# Patient Record
Sex: Female | Born: 1945
Health system: Southern US, Community
[De-identification: ages and names within clinical notes are randomized; demographics above are authoritative.]

## PROBLEM LIST (undated history)

## (undated) DIAGNOSIS — M542 Cervicalgia: Secondary | ICD-10-CM

## (undated) DIAGNOSIS — H6980 Other specified disorders of Eustachian tube, unspecified ear: Secondary | ICD-10-CM

## (undated) DIAGNOSIS — H6990 Unspecified Eustachian tube disorder, unspecified ear: Secondary | ICD-10-CM

## (undated) DIAGNOSIS — M810 Age-related osteoporosis without current pathological fracture: Secondary | ICD-10-CM

## (undated) DIAGNOSIS — I10 Essential (primary) hypertension: Secondary | ICD-10-CM

## (undated) DIAGNOSIS — M5137 Other intervertebral disc degeneration, lumbosacral region: Secondary | ICD-10-CM

## (undated) DIAGNOSIS — R413 Other amnesia: Secondary | ICD-10-CM

## (undated) DIAGNOSIS — M4802 Spinal stenosis, cervical region: Secondary | ICD-10-CM

## (undated) DIAGNOSIS — M51379 Other intervertebral disc degeneration, lumbosacral region without mention of lumbar back pain or lower extremity pain: Secondary | ICD-10-CM

## (undated) DIAGNOSIS — M5412 Radiculopathy, cervical region: Secondary | ICD-10-CM

## (undated) DIAGNOSIS — M5030 Other cervical disc degeneration, unspecified cervical region: Secondary | ICD-10-CM

## (undated) HISTORY — DX: Cervicalgia: M54.2

## (undated) HISTORY — PX: COLONOSCOPY: SHX174

## (undated) HISTORY — DX: Other intervertebral disc degeneration, lumbosacral region without mention of lumbar back pain or lower extremity pain: M51.379

## (undated) HISTORY — DX: Other specified disorders of Eustachian tube, unspecified ear: H69.80

## (undated) HISTORY — PX: TUBAL LIGATION: SHX77

## (undated) HISTORY — DX: Other intervertebral disc degeneration, lumbosacral region: M51.37

## (undated) HISTORY — DX: Other amnesia: R41.3

## (undated) HISTORY — PX: SPINE SURGERY: SHX786

## (undated) HISTORY — DX: Radiculopathy, cervical region: M54.12

## (undated) HISTORY — DX: Other cervical disc degeneration, unspecified cervical region: M50.30

## (undated) HISTORY — DX: Essential (primary) hypertension: I10

## (undated) HISTORY — DX: Unspecified eustachian tube disorder, unspecified ear: H69.90

## (undated) HISTORY — DX: Spinal stenosis, cervical region: M48.02

---

## 1997-12-16 ENCOUNTER — Other Ambulatory Visit: Admission: RE | Admit: 1997-12-16 | Discharge: 1997-12-16 | Payer: Self-pay | Admitting: Obstetrics and Gynecology

## 1998-12-08 ENCOUNTER — Other Ambulatory Visit: Admission: RE | Admit: 1998-12-08 | Discharge: 1998-12-08 | Payer: Self-pay | Admitting: Obstetrics and Gynecology

## 1999-01-05 ENCOUNTER — Emergency Department (HOSPITAL_COMMUNITY): Admission: EM | Admit: 1999-01-05 | Discharge: 1999-01-05 | Payer: Self-pay | Admitting: Emergency Medicine

## 2000-01-03 ENCOUNTER — Other Ambulatory Visit: Admission: RE | Admit: 2000-01-03 | Discharge: 2000-01-03 | Payer: Self-pay | Admitting: Obstetrics and Gynecology

## 2001-06-04 ENCOUNTER — Other Ambulatory Visit: Admission: RE | Admit: 2001-06-04 | Discharge: 2001-06-04 | Payer: Self-pay | Admitting: Obstetrics and Gynecology

## 2002-11-06 ENCOUNTER — Other Ambulatory Visit: Admission: RE | Admit: 2002-11-06 | Discharge: 2002-11-06 | Payer: Self-pay | Admitting: Obstetrics and Gynecology

## 2003-01-07 LAB — HM COLONOSCOPY

## 2003-11-11 ENCOUNTER — Other Ambulatory Visit: Admission: RE | Admit: 2003-11-11 | Discharge: 2003-11-11 | Payer: Self-pay | Admitting: Obstetrics and Gynecology

## 2003-11-27 ENCOUNTER — Ambulatory Visit: Payer: Self-pay | Admitting: Internal Medicine

## 2003-11-30 ENCOUNTER — Ambulatory Visit (HOSPITAL_COMMUNITY): Admission: RE | Admit: 2003-11-30 | Discharge: 2003-11-30 | Payer: Self-pay | Admitting: Internal Medicine

## 2004-09-15 ENCOUNTER — Ambulatory Visit: Payer: Self-pay | Admitting: Internal Medicine

## 2004-11-29 ENCOUNTER — Other Ambulatory Visit: Admission: RE | Admit: 2004-11-29 | Discharge: 2004-11-29 | Payer: Self-pay | Admitting: Obstetrics and Gynecology

## 2005-02-06 ENCOUNTER — Ambulatory Visit: Payer: Self-pay | Admitting: Internal Medicine

## 2005-02-07 ENCOUNTER — Ambulatory Visit: Payer: Self-pay | Admitting: Internal Medicine

## 2005-02-13 ENCOUNTER — Ambulatory Visit: Payer: Self-pay | Admitting: Internal Medicine

## 2006-04-11 ENCOUNTER — Ambulatory Visit: Payer: Self-pay | Admitting: Internal Medicine

## 2006-04-11 LAB — CONVERTED CEMR LAB
Basophils Absolute: 0.4 10*3/uL — ABNORMAL HIGH (ref 0.0–0.1)
Basophils Relative: 4.6 % — ABNORMAL HIGH (ref 0.0–1.0)
Eosinophils Absolute: 0.2 10*3/uL (ref 0.0–0.6)
Eosinophils Relative: 2.1 % (ref 0.0–5.0)
HCT: 43.4 % (ref 36.0–46.0)
Hemoglobin: 15 g/dL (ref 12.0–15.0)
Lymphocytes Relative: 39.3 % (ref 12.0–46.0)
MCHC: 34.6 g/dL (ref 30.0–36.0)
MCV: 89 fL (ref 78.0–100.0)
Monocytes Absolute: 0.4 10*3/uL (ref 0.2–0.7)
Monocytes Relative: 5.2 % (ref 3.0–11.0)
Neutro Abs: 3.9 10*3/uL (ref 1.4–7.7)
Neutrophils Relative %: 48.8 % (ref 43.0–77.0)
Platelets: 233 10*3/uL (ref 150–400)
RBC: 4.88 M/uL (ref 3.87–5.11)
RDW: 12.6 % (ref 11.5–14.6)
TSH: 1 microintl units/mL (ref 0.35–5.50)
Vitamin B-12: 299 pg/mL (ref 211–911)
WBC: 8 10*3/uL (ref 4.5–10.5)

## 2006-04-12 ENCOUNTER — Encounter: Admission: RE | Admit: 2006-04-12 | Discharge: 2006-04-12 | Payer: Self-pay | Admitting: Internal Medicine

## 2007-02-15 ENCOUNTER — Encounter: Payer: Self-pay | Admitting: Internal Medicine

## 2007-10-06 ENCOUNTER — Encounter: Payer: Self-pay | Admitting: Internal Medicine

## 2007-11-05 ENCOUNTER — Telehealth: Payer: Self-pay | Admitting: Internal Medicine

## 2008-01-08 LAB — HM MAMMOGRAPHY: HM Mammogram: NORMAL

## 2008-09-30 ENCOUNTER — Encounter: Payer: Self-pay | Admitting: Internal Medicine

## 2008-10-08 ENCOUNTER — Telehealth: Payer: Self-pay | Admitting: Internal Medicine

## 2008-10-09 ENCOUNTER — Telehealth: Payer: Self-pay | Admitting: Internal Medicine

## 2008-10-13 ENCOUNTER — Ambulatory Visit: Payer: Self-pay | Admitting: Internal Medicine

## 2008-10-13 DIAGNOSIS — H6980 Other specified disorders of Eustachian tube, unspecified ear: Secondary | ICD-10-CM | POA: Insufficient documentation

## 2008-10-13 DIAGNOSIS — I10 Essential (primary) hypertension: Secondary | ICD-10-CM | POA: Insufficient documentation

## 2008-10-13 DIAGNOSIS — M5137 Other intervertebral disc degeneration, lumbosacral region: Secondary | ICD-10-CM | POA: Insufficient documentation

## 2008-10-13 DIAGNOSIS — M5030 Other cervical disc degeneration, unspecified cervical region: Secondary | ICD-10-CM | POA: Insufficient documentation

## 2008-10-14 DIAGNOSIS — Z8679 Personal history of other diseases of the circulatory system: Secondary | ICD-10-CM | POA: Insufficient documentation

## 2008-10-14 DIAGNOSIS — R079 Chest pain, unspecified: Secondary | ICD-10-CM | POA: Insufficient documentation

## 2008-10-19 ENCOUNTER — Encounter: Payer: Self-pay | Admitting: Internal Medicine

## 2008-11-02 ENCOUNTER — Telehealth: Payer: Self-pay | Admitting: Internal Medicine

## 2008-11-11 ENCOUNTER — Encounter: Payer: Self-pay | Admitting: Internal Medicine

## 2009-01-21 ENCOUNTER — Encounter: Payer: Self-pay | Admitting: Internal Medicine

## 2009-01-23 HISTORY — PX: NASAL SEPTUM SURGERY: SHX37

## 2009-04-26 ENCOUNTER — Emergency Department (HOSPITAL_COMMUNITY): Admission: EM | Admit: 2009-04-26 | Discharge: 2009-04-27 | Payer: Self-pay | Admitting: Emergency Medicine

## 2009-05-06 ENCOUNTER — Ambulatory Visit: Payer: Self-pay | Admitting: Internal Medicine

## 2009-05-06 DIAGNOSIS — M5412 Radiculopathy, cervical region: Secondary | ICD-10-CM | POA: Insufficient documentation

## 2009-05-06 DIAGNOSIS — M542 Cervicalgia: Secondary | ICD-10-CM | POA: Insufficient documentation

## 2009-05-07 ENCOUNTER — Telehealth: Payer: Self-pay | Admitting: Internal Medicine

## 2009-05-10 ENCOUNTER — Telehealth (INDEPENDENT_AMBULATORY_CARE_PROVIDER_SITE_OTHER): Payer: Self-pay | Admitting: *Deleted

## 2009-05-18 ENCOUNTER — Ambulatory Visit (HOSPITAL_COMMUNITY): Admission: RE | Admit: 2009-05-18 | Discharge: 2009-05-18 | Payer: Self-pay | Admitting: Internal Medicine

## 2009-05-19 ENCOUNTER — Telehealth: Payer: Self-pay | Admitting: Internal Medicine

## 2009-05-19 DIAGNOSIS — M4802 Spinal stenosis, cervical region: Secondary | ICD-10-CM | POA: Insufficient documentation

## 2009-05-20 ENCOUNTER — Ambulatory Visit: Payer: Self-pay | Admitting: Internal Medicine

## 2009-05-21 ENCOUNTER — Encounter: Payer: Self-pay | Admitting: Internal Medicine

## 2009-06-17 ENCOUNTER — Encounter: Payer: Self-pay | Admitting: Internal Medicine

## 2009-07-23 HISTORY — PX: CERVICAL DISCECTOMY: SHX98

## 2009-08-09 ENCOUNTER — Ambulatory Visit (HOSPITAL_COMMUNITY): Admission: RE | Admit: 2009-08-09 | Discharge: 2009-08-10 | Payer: Self-pay | Admitting: Neurosurgery

## 2009-08-20 ENCOUNTER — Encounter: Payer: Self-pay | Admitting: Internal Medicine

## 2009-10-06 ENCOUNTER — Encounter: Admission: RE | Admit: 2009-10-06 | Discharge: 2009-10-06 | Payer: Self-pay | Admitting: Neurosurgery

## 2009-10-07 ENCOUNTER — Telehealth: Payer: Self-pay | Admitting: Internal Medicine

## 2009-12-22 ENCOUNTER — Ambulatory Visit: Payer: Self-pay | Admitting: Internal Medicine

## 2009-12-22 LAB — CONVERTED CEMR LAB
Cholesterol: 219 mg/dL — ABNORMAL HIGH (ref 0–200)
Direct LDL: 127.5 mg/dL
HDL: 67.8 mg/dL (ref >39.00)
Total CHOL/HDL Ratio: 3
Triglycerides: 92 mg/dL (ref 0.0–149.0)
VLDL: 18.4 mg/dL (ref 0.0–40.0)

## 2010-02-07 ENCOUNTER — Encounter: Payer: Self-pay | Admitting: Internal Medicine

## 2010-02-12 ENCOUNTER — Encounter: Payer: Self-pay | Admitting: Internal Medicine

## 2010-02-22 NOTE — Assessment & Plan Note (Signed)
Summary: pain in neck and left shoulder and arm-strained muscle-lb   Vital Signs:  Patient profile:   65 year old female Height:      65 inches Weight:      176 pounds BMI:     29.39 O2 Sat:      97 % on Room air Temp:     98.1 degrees F oral Pulse rate:   76 / minute Pulse rhythm:   regular Resp:     16 per minute BP sitting:   118 / 76  (left arm) Cuff size:   large  Vitals Entered By: Rock Nephew CMA (May 06, 2009 8:15 AM)  Nutrition Counseling: Patient's BMI is greater than 25 and therefore counseled on weight management options.  O2 Flow:  Room air CC: cervical pain that radiates down left arm x 4days, Neck pain Is Patient Diabetic? No Pain Assessment Patient in pain? yes     Location: neck   Primary Care Provider:  michael Norins  CC:  cervical pain that radiates down left arm x 4days and Neck pain.  History of Present Illness:  Neck Pain      This is a 65 year old woman who presents with Neck pain.  The problem began 2 weeks ago.  The intensity is described as moderate-severe.  The patient reports left neck pain and left shoulder pain, but denies right neck pain, midline neck pain, bilateral neck pain, and headache.  Associated symptoms include numbness, tingling/parasthesias, and impaired neck ROM.  The patient denies the following associated symptoms: weakness, impaired coordination, gait disturbance, fever, bladder dysfunction, bowel dysfunction, locking, and clicking.  The pain is described as burning and radiates to the left arm.  The pain is better with rest and NSAIDs.  History is significant for cervical spondylosis and prior neck pain episodes.  Evaluation to date has included emergency room visit.    Current Medications (verified): 1)  Furosemide 20 Mg Tabs (Furosemide) .Marland Kitchen.. 1 By Mouth Once Daily 2)  Hydrocodone-Acetaminophen 5-325 Mg Tabs (Hydrocodone-Acetaminophen) .Marland Kitchen.. 1-2 Every Six Hours As Needed  Allergies (verified): 1)  ! Sulfa  Past  History:  Past Medical History: Reviewed history from 10/13/2008 and no changes required. UCD MITRAL VALVE PROLAPSE, HX OF (ICD-V12.50) Hx of CHEST PAIN (ICD-786.50)  Past Surgical History: Reviewed history from 10/13/2008 and no changes required. Colon polypectomy Tubal ligation  Family History: Reviewed history from 10/13/2008 and no changes required. Father - CAD/MI Brother - CAD Maternal Aunt - breast cancer Neg - colon cancer, DM  Social History: Reviewed history from 10/13/2008 and no changes required. HSG Married '69 1 daughter '92; 1 grandchild Work - Patent attorney marriage in good health; spouse with health problems  Review of Systems  The patient denies anorexia, fever, chest pain, peripheral edema, prolonged cough, abdominal pain, suspicious skin lesions, enlarged lymph nodes, and angioedema.    Physical Exam  General:  alert, well-developed, well-nourished, well-hydrated, appropriate dress, normal appearance, healthy-appearing, cooperative to examination, and good hygiene.   Head:  normocephalic, atraumatic, no abnormalities observed, and no abnormalities palpated.   Eyes:  vision grossly intact, pupils equal, pupils round, and pupils reactive to light.   Mouth:  Oral mucosa and oropharynx without lesions or exudates.  Teeth in good repair. Neck:  supple, full ROM, no masses, no thyromegaly, no JVD, normal carotid upstroke, no carotid bruits, and no cervical lymphadenopathy.   Lungs:  normal respiratory effort, no intercostal retractions, no accessory muscle use, normal breath sounds, no  dullness, no fremitus, no crackles, and no wheezes.   Heart:  normal rate, regular rhythm, no murmur, no gallop, no rub, and no JVD.   Abdomen:  soft, non-tender, normal bowel sounds, no distention, no masses, no guarding, no rigidity, no rebound tenderness, no hepatomegaly, and no splenomegaly.   Msk:  normal ROM, no joint tenderness, no joint swelling, no joint warmth,  no redness over joints, no joint deformities, no joint instability, no crepitation, and no muscle atrophy.   Pulses:  R and L carotid,radial,femoral,dorsalis pedis and posterior tibial pulses are full and equal bilaterally Extremities:  No clubbing, cyanosis, edema, or deformity noted with normal full range of motion of all joints.   Neurologic:  alert & oriented X3, cranial nerves II-XII intact, strength normal in all extremities, sensation intact to light touch, sensation intact to pinprick, gait normal, finger-to-nose normal, heel-to-shin normal, toes down bilaterally on Babinski, Romberg negative, and LUE hyperreflexia.   Skin:  Intact without suspicious lesions or rashes Cervical Nodes:  no anterior cervical adenopathy and no posterior cervical adenopathy.   Axillary Nodes:  no R axillary adenopathy and no L axillary adenopathy.   Psych:  Cognition and judgment appear intact. Alert and cooperative with normal attention span and concentration. No apparent delusions, illusions, hallucinations   Impression & Recommendations:  Problem # 1:  CERVICAL RADICULOPATHY, LEFT (ICD-723.4) Assessment New  Orders: Admin of Therapeutic Inj  intramuscular or subcutaneous (16109) Ketorolac-Toradol 15mg  (U0454) Radiology Referral (Radiology) T-Cervical Spine Comp 4 Views [phone])  Problem # 2:  DEGENERATIVE DISC DISEASE, CERVICAL SPINE (ICD-722.4) Assessment: Deteriorated  Orders: Radiology Referral (Radiology) T-Cervical Spine Comp 4 Views [phone])  Problem # 3:  NECK PAIN, ACUTE (ICD-723.1) Assessment: New  The following medications were removed from the medication list:    Hydrocodone-acetaminophen 5-325 Mg Tabs (Hydrocodone-acetaminophen) .Marland Kitchen... 1-2 every six hours as needed Her updated medication list for this problem includes:    Naprelan 750 Mg Xr24h-tab (Naproxen sodium) ..... One by mouth once daily for neck pain    Amrix 15 Mg Xr24h-cap (Cyclobenzaprine hcl) ..... One by mouth once  daily as needed for neck pain    Tramadol Hcl 50 Mg Tabs (Tramadol hcl) .Marland Kitchen... 1-2 by mouth qid as needed for pain  Orders: Radiology Referral (Radiology)  Complete Medication List: 1)  Furosemide 20 Mg Tabs (Furosemide) .Marland Kitchen.. 1 by mouth once daily 2)  Naprelan 750 Mg Xr24h-tab (Naproxen sodium) .... One by mouth once daily for neck pain 3)  Amrix 15 Mg Xr24h-cap (Cyclobenzaprine hcl) .... One by mouth once daily as needed for neck pain 4)  Tramadol Hcl 50 Mg Tabs (Tramadol hcl) .Marland Kitchen.. 1-2 by mouth qid as needed for pain  Patient Instructions: 1)  Please schedule a follow-up appointment in 2 weeks. 2)  Most patients (90%) with low back pain will improve with time (2-6 weeks). Keep active but avoid activities that are painful. Apply moist heat and/or ice to lower back several times a day. Prescriptions: TRAMADOL HCL 50 MG TABS (TRAMADOL HCL) 1-2 by mouth QID as needed for pain  #100 x 11   Entered and Authorized by:   Etta Grandchild MD   Signed by:   Etta Grandchild MD on 05/06/2009   Method used:   Print then Give to Patient   RxID:   [id number] AMRIX 15 MG XR24H-CAP (CYCLOBENZAPRINE HCL) One by mouth once daily as needed for neck pain  #15 x 0   Entered and Authorized by:   Etta Grandchild  MD   Signed by:   Etta Grandchild MD on 05/06/2009   Method used:   Samples Given   RxID:   [id number] NAPRELAN 750 MG XR24H-TAB (NAPROXEN SODIUM) One by mouth once daily for neck pain  #10 x 0   Entered and Authorized by:   Etta Grandchild MD   Signed by:   Etta Grandchild MD on 05/06/2009   Method used:   Samples Given   RxID:   [id number]    Medication Administration  Injection # 1:    Medication: Ketorolac-Toradol 15mg     Diagnosis: CERVICAL RADICULOPATHY, LEFT (ICD-723.4)    Route: IM    Site: LUOQ gluteus    Exp Date: 05/24/2010    Lot #: [id number]    Mfr: [email or web address]    Patient tolerated injection without complications    Given by: Rock Nephew CMA (May 06, 2009  8:38 AM)  Orders Added: 1)  Admin of Therapeutic Inj  intramuscular or subcutaneous [96372] 2)  Ketorolac-Toradol 15mg  [J1885] 3)  Radiology Referral [Radiology] 4)  T-Cervical Spine Comp 4 Views [72050TC] 5)  Est. Patient Level IV [32440]

## 2010-02-22 NOTE — Assessment & Plan Note (Signed)
Summary: FU---RENEW MEDS---STC   Vital Signs:  Patient profile:   65 year old female Height:      65 inches Weight:      171 pounds BMI:     28.56 O2 Sat:      98 % on Room air Temp:     97.5 degrees F oral Pulse rate:   76 / minute BP sitting:   142 / 74  (left arm) Cuff size:   large  Vitals Entered By: Bill Salinas CMA (December 22, 2009 10:34 AM)  O2 Flow:  Room air CC: pt here to discuss BP. She also wants to discuss nail fungus  Vision Screening:      Vision Comments: Last eye exam Oct 2011 with slight change in vision   Primary Care Provider:  Illene Regulus  CC:  pt here to discuss BP. She also wants to discuss nail fungus.  History of Present Illness: Patient presents for annual follow-up. In the interval she has had cervical disk surgery by Dr. Jordan Likes, C3-4 diskectomy and fusion in July after failing to improve on physical therapy. since siurgery she has done well without any further pain. She has otherwise done well.   She had mammogram Dec '10 and is scheduled for Dec '11. She is current with Gyn and will be seeing her gynecologist, Dr. Duane Lope, December '11. Had colonosocopy in '04 - normal study.   She is independent in all ADLs. She does not have signs or symptoms of depression. She has had no falls and has no fall risk. She is cognitive intact and functional.  Current Medications (verified): 1)  Furosemide 20 Mg Tabs (Furosemide) .Marland Kitchen.. 1 By Mouth Once Daily  Allergies (verified): 1)  ! Sulfa  Past History:  Past Medical History: UCD SPINAL STENOSIS, CERVICAL (ICD-723.0) NECK PAIN, ACUTE (ICD-723.1) CERVICAL RADICULOPATHY, LEFT (ICD-723.4) ESSENTIAL HYPERTENSION (ICD-401.9) DEGENERATIVE DISC DISEASE, LUMBAR SPINE (ICD-722.52) DEGENERATIVE DISC DISEASE, CERVICAL SPINE (ICD-722.4) DYSFUNCTION OF EUSTACHIAN TUBE (ICD-381.81) MITRAL VALVE PROLAPSE, HX OF (ICD-V12.50) Hx of CHEST PAIN (ICD-786.50)  Past Surgical History: Colon polypectomy Tubal  ligation C3-4 diskectomy with fusion July '11 (Dr. 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  The patient denies anorexia, fever, weight loss, decreased hearing, hoarseness, chest pain, syncope, dyspnea on exertion, prolonged cough, hemoptysis, abdominal pain, severe indigestion/heartburn, incontinence, depression, unusual weight change, and enlarged lymph nodes.    Physical Exam  General:  Well-developed,well-nourished,in no acute distress; alert,appropriate and cooperative throughout examination Head:  normocephalic and atraumatic.   Eyes:  vision grossly intact, pupils equal, pupils round, corneas and lenses clear, no optic disk abnormalities, and no retinal abnormalitiies.   Ears:  External ear exam shows no significant lesions or deformities.  Otoscopic examination reveals clear canals, tympanic membranes are intact bilaterally without bulging, retraction, inflammation or discharge. Hearing is grossly normal bilaterally. Nose:  no external deformity and no external erythema.   Mouth:  Oral mucosa and oropharynx without lesions or exudates.  Teeth in good repair. Neck:  supple, full ROM, no thyromegaly, and no carotid bruits.   Chest Wall:  no deformities.  Breasts:  deferred to gyn Lungs:  Normal respiratory effort, chest expands symmetrically. Lungs are clear to auscultation, no crackles or wheezes. Heart:  Normal rate and regular rhythm. S1 and S2 normal without gallop, murmur, click, rub or other extra sounds. Abdomen:  soft, non-tender, normal bowel sounds, and no guarding.   Genitalia:  deferred  to gyn Msk:  no joint tenderness, no joint swelling, and no redness over joints.   Pulses:  2+ radial and DP pulses Extremities:  No clubbing, cyanosis, edema, or deformity noted with normal full range of motion of all joints.   Neurologic:  alert & oriented X3, cranial nerves II-XII intact, strength normal in all extremities, gait normal, and DTRs symmetrical and normal.   Skin:  very minimal thickening of fingernails. Several toe nails with thickening.  Cervical Nodes:  no anterior cervical adenopathy and no posterior cervical adenopathy.   Psych:  Oriented X3, memory intact for recent and remote, normally interactive, good eye contact, and not anxious appearing.     Impression & Recommendations:  Problem # 1:  CERVICAL RADICULOPATHY, LEFT (ICD-723.4) excellent recovery after C-s;pine C3-4 diskectomy and fusion. She has been released by Dr. Jordan Likes.   Problem # 2:  ESSENTIAL HYPERTENSION (ICD-401.9)  Her updated medication list for this problem includes:    Furosemide 20 Mg Tabs (Furosemide) .Marland Kitchen... 1 by mouth once daily  BP today: 142/74 Prior BP: 138/88 (05/20/2009)  BP mildlly elevated today. Previous reading ok  Plan - monitor BP at home and report back if running greater then 135/90.  Problem # 3:  Preventive Health Care (ICD-V70.0) Interval history as noted. Currently doing well. Limited physical exam is normal. Reviewed labs in eChart from July: normal CBC and metabolic panel. Lipid profile today with LDL 127 which is at goal of 130 or less. Current with gyn and with breast health. Last colonoscopy Dec '04. Immunizations: tetnjus Dec '98 - due for tetnus booster. Shingels vaccine today. 12Lead EKG from '10 at urgent care reviewed - normal without evidence of ischemia  In summary - a very nice woman who had two surgeries in '11 and has done well. She appears to be medically stable at today's visit. she will return 1 year or as needed.   Complete Medication List: 1)  Furosemide 20  Mg Tabs (Furosemide) .Marland Kitchen.. 1 by mouth once daily  Other Orders: Flu Vaccine 44yrs + (81191) Admin 1st Vaccine (47829) Zoster (Shingles) Vaccine Live (56213) Admin of Any Addtl Vaccine (08657) TLB-Lipid Panel (80061-LIPID)  Patient: Vanessa Fuentes Note: All result statuses are Final unless otherwise noted.  Tests: (1) Lipid Panel (LIPID)   Cholesterol          [H]  219 mg/dL                   8-469     ATP III Classification            Desirable:  < 200 mg/dL                    Borderline High:  200 - 239 mg/dL               High:  > = 240 mg/dL   Triglycerides             92.0 mg/dL                  6.2-952.8     Normal:  <150 mg/dL     Borderline  High:  150 - 199 mg/dL   HDL                       95.62 mg/dL                 >13.08   VLDL Cholesterol          18.4 mg/dL                  6.5-78.4  CHO/HDL Ratio:  CHD Risk                             3                    Men          Women     1/2 Average Risk     3.4          3.3     Average Risk          5.0          4.4     2X Average Risk          9.6          7.1     3X Average Risk          15.0          11.0                           Tests: (2) Cholesterol LDL - Direct (DIRLDL)  Cholesterol LDL - Direct                             127.5 mg/dLPrescriptions: FUROSEMIDE 20 MG TABS (FUROSEMIDE) 1 by mouth once daily  #30 Tablet x 12   Entered and Authorized by:   Jacques Navy MD   Signed by:   Jacques Navy MD on 12/22/2009   Method used:   Electronically to        [facility]. [phone]* (retail)       [address]       [place], [place]  [address]       Ph: [phone]       Fax: [phone]   RxID:   [id number]    Orders Added: 1)  Flu Vaccine 60yrs + [51884] 2)  Admin 1st Vaccine [90471] 3)  Zoster (Shingles) Vaccine Live [90736] 4)  Admin of Any Addtl Vaccine [90472] 5)  TLB-Lipid Panel [80061-LIPID] 6)  Est. Patient 40-64 years [99396]   Immunizations  Administered:  Influenza Vaccine # 1:    Vaccine Type: Fluvax 3+    Site: left deltoid    Mfr: GlaxoSmithKline    Dose: 0.5 ml    Route: IM    Given by: Ami Bullins CMA    Exp. Date: 07/23/2010    Lot #: [id number]    VIS given: 08/17/09 version given December 22, 2009.  Zostavax # 1:    Vaccine Type: Zostavax    Site: right arm    Mfr: Merck    Dose: 0.5 ml    Route: [place]    Given by: Ami Bullins CMA    Exp. Date: 08/27/2010    Lot #: [id number]    VIS given:  11/04/04 given December 22, 2009.   Immunizations Administered:  Influenza Vaccine # 1:    Vaccine Type: Fluvax 3+    Site: left deltoid    Mfr: GlaxoSmithKline    Dose: 0.5 ml    Route: IM    Given by: Ami Bullins CMA    Exp. Date: 07/23/2010    Lot #: [id number]    VIS given: 08/17/09 version given December 22, 2009.  Zostavax # 1:    Vaccine Type: Zostavax    Site: right arm    Mfr: Merck    Dose: 0.5 ml    Route: [place]    Given by: Ami Bullins CMA    Exp. Date: 08/27/2010    Lot #: [id number]    VIS given: 11/04/04 given December 22, 2009.

## 2010-02-22 NOTE — Assessment & Plan Note (Signed)
Summary: 2 wk rov /nws  #   Vital Signs:  Patient profile:   65 year old female Height:      65 inches Weight:      176 pounds BMI:     29.39 O2 Sat:      96 % on Room air Temp:     98.7 degrees F oral Pulse rate:   74 / minute Pulse rhythm:   regular Resp:     16 per minute BP sitting:   138 / 88  (left arm) Cuff size:   large  Vitals Entered By: Rock Nephew CMA (May 20, 2009 8:11 AM)  Nutrition Counseling: Patient's BMI is greater than 25 and therefore counseled on weight management options.  O2 Flow:  Room air CC: follow-up visit//discuss test results Is Patient Diabetic? No   Primary Care Provider:  Illene Regulus  CC:  follow-up visit//discuss test results.  History of Present Illness: She returns for f/up and to go over the MRI result. It showed nerve encroachment and spinal stenosis with some spurs. There was bilateral disease in the C-spine. She still has a significant amount of pain and finds that Tramadol works the best for her pain. She says that Amrix does not help much. The pain still radiates into her left shoulder but not beyond that and she has not had any N/W/T in her arms or legs.  Preventive Screening-Counseling & Management  Alcohol-Tobacco     Alcohol drinks/day: <1     Alcohol type: wine     Smoking Status: never  Medications Prior to Update: 1)  Furosemide 20 Mg Tabs (Furosemide) .Marland Kitchen.. 1 By Mouth Once Daily 2)  Naprelan 750 Mg Xr24h-Tab (Naproxen Sodium) .... One By Mouth Once Daily For Neck Pain 3)  Amrix 15 Mg Xr24h-Cap (Cyclobenzaprine Hcl) .... One By Mouth Once Daily As Needed For Neck Pain 4)  Tramadol Hcl 50 Mg Tabs (Tramadol Hcl) .Marland Kitchen.. 1-2 By Mouth Qid As Needed For Pain  Current Medications (verified): 1)  Furosemide 20 Mg Tabs (Furosemide) .Marland Kitchen.. 1 By Mouth Once Daily 2)  Naprelan 750 Mg Xr24h-Tab (Naproxen Sodium) .... One By Mouth Once Daily For Neck Pain 3)  Tramadol Hcl 50 Mg Tabs (Tramadol Hcl) .Marland Kitchen.. 1-2 By Mouth Qid As Needed For  Pain  Allergies (verified): 1)  ! Sulfa  Past History:  Past Medical History: Reviewed history from 10/13/2008 and no changes required. UCD MITRAL VALVE PROLAPSE, HX OF (ICD-V12.50) Hx of CHEST PAIN (ICD-786.50)  Past Surgical History: Reviewed history from 10/13/2008 and no changes required. Colon polypectomy Tubal ligation  Family History: Reviewed history from 10/13/2008 and no changes required. Father - CAD/MI Brother - CAD Maternal Aunt - breast cancer Neg - colon cancer, DM  Social History: Reviewed history from 10/13/2008 and no changes required. HSG Married '69 1 daughter '92; 1 grandchild Work - Patent attorney marriage in good health; spouse with health problems  Review of Systems  The patient denies anorexia, fever, weight loss, chest pain, peripheral edema, prolonged cough, headaches, hemoptysis, abdominal pain, hematuria, incontinence, suspicious skin lesions, difficulty walking, enlarged lymph nodes, and breast masses.    Physical Exam  General:  alert, well-developed, well-nourished, well-hydrated, appropriate dress, normal appearance, healthy-appearing, cooperative to examination, and good hygiene.   Neck:  supple, full ROM, no masses, no thyromegaly, no JVD, normal carotid upstroke, no carotid bruits, and no cervical lymphadenopathy.   Lungs:  normal respiratory effort, no intercostal retractions, no accessory muscle use, normal breath sounds, no  dullness, no fremitus, no crackles, and no wheezes.   Heart:  normal rate, regular rhythm, no murmur, no gallop, no rub, and no JVD.   Abdomen:  soft, non-tender, normal bowel sounds, no distention, no masses, no guarding, no rigidity, no rebound tenderness, no hepatomegaly, and no splenomegaly.   Msk:  normal ROM, no joint tenderness, no joint swelling, no joint warmth, no redness over joints, no joint deformities, no joint instability, no crepitation, and no muscle atrophy.   Pulses:  R and L  carotid,radial,femoral,dorsalis pedis and posterior tibial pulses are full and equal bilaterally Extremities:  No clubbing, cyanosis, edema, or deformity noted with normal full range of motion of all joints.   Neurologic:  alert & oriented X3, cranial nerves II-XII intact, strength normal in all extremities, sensation intact to light touch, sensation intact to pinprick, gait normal, finger-to-nose normal, heel-to-shin normal, toes down bilaterally on Babinski, Romberg negative, and LUE hyperreflexia.   Skin:  Intact without suspicious lesions or rashes Cervical Nodes:  no anterior cervical adenopathy and no posterior cervical adenopathy.   Psych:  Cognition and judgment appear intact. Alert and cooperative with normal attention span and concentration. No apparent delusions, illusions, hallucinations   Impression & Recommendations:  Problem # 1:  CERVICAL RADICULOPATHY, LEFT (ICD-723.4) I have ordered a Neuorsurgery referral to see if she needs surgery or ESI.  Problem # 2:  NECK PAIN, ACUTE (ICD-723.1) Assessment: Unchanged  The following medications were removed from the medication list:    Amrix 15 Mg Xr24h-cap (Cyclobenzaprine hcl) ..... One by mouth once daily as needed for neck pain Her updated medication list for this problem includes:    Naprelan 750 Mg Xr24h-tab (Naproxen sodium) ..... One by mouth once daily for neck pain    Tramadol Hcl 50 Mg Tabs (Tramadol hcl) .Marland Kitchen... 1-2 by mouth qid as needed for pain  Complete Medication List: 1)  Furosemide 20 Mg Tabs (Furosemide) .Marland Kitchen.. 1 by mouth once daily 2)  Naprelan 750 Mg Xr24h-tab (Naproxen sodium) .... One by mouth once daily for neck pain 3)  Tramadol Hcl 50 Mg Tabs (Tramadol hcl) .Marland Kitchen.. 1-2 by mouth qid as needed for pain  Patient Instructions: 1)  Please schedule a follow-up appointment in 1 month. 2)  Check your Blood Pressure regularly. If it is above 130/80: you should make an appointment.

## 2010-02-22 NOTE — Consult Note (Signed)
Summary: [facility]   Imported By: Sherian Rein 06/04/2009 11:24:09  _____________________________________________________________________  External Attachment:    Type:   Image     Comment:   External Document

## 2010-02-22 NOTE — Progress Notes (Signed)
  Phone Note Outgoing Call   Summary of Call: she has spinal stenosis and needs to see a neurosurgeon Initial call taken by: Etta Grandchild MD,  May 19, 2009 10:36 AM  Follow-up for Phone Call       Follow-up by: Etta Grandchild MD,  May 19, 2009 10:37 AM  Additional Follow-up for Phone Call Additional follow up Details #1::        pt informed  Additional Follow-up by: Margaret Pyle, CMA,  May 19, 2009 10:47 AM  New Problems: SPINAL STENOSIS, CERVICAL (ICD-723.0)   New Problems: SPINAL STENOSIS, CERVICAL (ICD-723.0)

## 2010-02-22 NOTE — Progress Notes (Signed)
  Phone Note Outgoing Call   Summary of Call: spoke to dr. Zachery Conch and here is the approval number [id number] Initial call taken by: Etta Grandchild MD,  May 10, 2009 3:32 PM  Follow-up for Phone Call       Follow-up by: Etta Grandchild MD,  May 10, 2009 3:31 PM  Additional Follow-up for Phone Call Additional follow up Details #1::        Appt scheduled for April26,2011@11 :45- pt informed Deborah  Additional Follow-up by: Shelbie Proctor,  May 12, 2009 1:59 PM

## 2010-02-22 NOTE — Progress Notes (Signed)
Summary: RESULTS  Phone Note Call from Patient Call back at Home Phone [phone]   Summary of Call: Patient is requesting results of xray. C/o continued pain, iIs it ok to take more than one of the pain medication?  Initial call taken by: Lamar Sprinkles, CMA,  May 07, 2009 1:39 PM  Follow-up for Phone Call        xray showed slipped discs at multiple levels, I have ordered an MRI to look for nerve and spinal cord damage, yes on 2 pain pills at a time Follow-up by: Etta Grandchild MD,  May 07, 2009 1:42 PM  Additional Follow-up for Phone Call Additional follow up Details #1::        Pt informed  Additional Follow-up by: Lamar Sprinkles, CMA,  May 07, 2009 1:59 PM

## 2010-02-22 NOTE — Letter (Signed)
Summary: [facility]  [facility]   Imported By: Lester Imogene 06/08/2009 09:23:04  _____________________________________________________________________  External Attachment:    Type:   Image     Comment:   External Document

## 2010-02-22 NOTE — Progress Notes (Signed)
  Phone Note Refill Request Message from:  Fax from Pharmacy on October 07, 2009 3:32 PM  Refills Requested: Medication #1:  FUROSEMIDE 20 MG TABS 1 by mouth once daily fax did not go through on first prescription  Initial call taken by: Ami Bullins CMA,  October 07, 2009 3:32 PM    Prescriptions: FUROSEMIDE 20 MG TABS (FUROSEMIDE) 1 by mouth once daily  #30 Tablet x 3   Entered by:   Ami Bullins CMA   Authorized by:   Jacques Navy MD   Signed by:   Bill Salinas CMA on 10/07/2009   Method used:   Electronically to        [facility]. [phone]* (retail)       [address]       [place], [place]  [address]       Ph: [phone]       Fax: [phone]   RxID:   [id number]

## 2010-02-22 NOTE — Letter (Signed)
Summary: [facility]   Imported By: Sherian Rein 06/29/2009 13:03:20  _____________________________________________________________________  External Attachment:    Type:   Image     Comment:   External Document

## 2010-02-22 NOTE — Letter (Signed)
Summary: [facility]  [facility]   Imported By: Lester Corwin 09/03/2009 08:42:20  _____________________________________________________________________  External Attachment:    Type:   Image     Comment:   External Document

## 2010-02-22 NOTE — Miscellaneous (Signed)
Summary: DC from [facility]  DC from [facility]   Imported By: Lester Charlotte Hall 02/10/2009 08:40:51  _____________________________________________________________________  External Attachment:    Type:   Image     Comment:   External Document

## 2010-02-24 NOTE — Letter (Signed)
Summary: Colonoscopy Letter  [facility]  [address] [place], [place] [address]   Phone: [phone]  Fax: [phone]      February 07, 2010 MRN: [id number]   [facility] [address] [place], [place]  [address]   Dear Ms. Lepage,   According to your medical record, it is time for you to schedule a Colonoscopy. The American Cancer Society recommends this procedure as a method to detect early colon cancer. Patients with a family history of colon cancer, or a personal history of colon polyps or inflammatory bowel disease are at increased risk.  This letter has been generated based on the recommendations made at the time of your procedure. If you feel that in your particular situation this may no longer apply, please contact our office.  Please call our office at [phone] to schedule this appointment or to update your records at your earliest convenience.  Thank you for cooperating with [place] to provide you with the very best care possible.   Sincerely,  Hedwig Morton. Juanda Chance, M.D.  [facility] [phone]

## 2010-04-10 LAB — CBC
HCT: 45.8 % (ref 36.0–46.0)
Hemoglobin: 15.8 g/dL — ABNORMAL HIGH (ref 12.0–15.0)
MCH: 31.1 pg (ref 26.0–34.0)
MCHC: 34.5 g/dL (ref 30.0–36.0)
MCV: 90 fL (ref 78.0–100.0)
Platelets: 176 10*3/uL (ref 150–400)
RBC: 5.09 MIL/uL (ref 3.87–5.11)
RDW: 13.1 % (ref 11.5–15.5)
WBC: 7.1 10*3/uL (ref 4.0–10.5)

## 2010-04-10 LAB — DIFFERENTIAL
Basophils Absolute: 0 10*3/uL (ref 0.0–0.1)
Basophils Relative: 1 % (ref 0–1)
Eosinophils Absolute: 0.2 10*3/uL (ref 0.0–0.7)
Eosinophils Relative: 3 % (ref 0–5)
Lymphocytes Relative: 32 % (ref 12–46)
Lymphs Abs: 2.2 10*3/uL (ref 0.7–4.0)
Monocytes Absolute: 0.4 10*3/uL (ref 0.1–1.0)
Monocytes Relative: 6 % (ref 3–12)
Neutro Abs: 4.2 10*3/uL (ref 1.7–7.7)
Neutrophils Relative %: 59 % (ref 43–77)

## 2010-04-10 LAB — BASIC METABOLIC PANEL
BUN: 9 mg/dL (ref 6–23)
CO2: 25 mEq/L (ref 19–32)
Calcium: 9.7 mg/dL (ref 8.4–10.5)
Chloride: 104 mEq/L (ref 96–112)
Creatinine, Ser: 0.81 mg/dL (ref 0.4–1.2)
GFR calc Af Amer: >60 mL/min (ref >60)
GFR calc non Af Amer: >60 mL/min (ref >60)
Glucose, Bld: 97 mg/dL (ref 70–99)
Potassium: 4.1 mEq/L (ref 3.5–5.1)
Sodium: 138 mEq/L (ref 135–145)

## 2010-04-10 LAB — TYPE AND SCREEN
ABO/RH(D): O POS
Antibody Screen: NEGATIVE

## 2010-04-10 LAB — ABO/RH: ABO/RH(D): O POS

## 2010-04-10 LAB — SURGICAL PCR SCREEN
MRSA, PCR: NEGATIVE
Staphylococcus aureus: NEGATIVE

## 2010-04-20 ENCOUNTER — Other Ambulatory Visit: Payer: Self-pay | Admitting: Obstetrics and Gynecology

## 2010-06-10 NOTE — Assessment & Plan Note (Signed)
[facility]                           PRIMARY CARE OFFICE NOTE   Vanessa, Fuentes                    MRN:          [id number]  DATE:04/11/2006                            DOB:          [birth year]    PRIMARY CARE OFFICE NOTE   [name] is a 65 year old Caucasian woman last seen in the office  February 13, 2005 with last full history and physical February 06, 2005.   The patient reports that Saturday morning she had a 30 minute episode of  tingling in both arms and hands, but no frank weakness.  On the morning  of this visit, she reports she had some tingling in her right arm when  holding her hair dryer to dry her hair.  The patient also reports that  she has had a problem with her right leg feeling tight, and this has  been a problem for some time, although it is getting to be more  pronounced and more continuous.   The patient reports she had a full physical exam related to work and was  found to have painless hematuria.  She was treated with antibiotics for  potential cystitis, although she had no burning dysuria, post-void  bladder spasm, or other symptoms.  She has had UTI in the past and knows  what they feel like.   CURRENT MEDICATIONS:  None.   REVIEW OF SYSTEMS:  The patient has had no fevers or chills.  She has  had some mild vertigo.  She has had no musculoskeletal, dermatologic  complaints.  No lymphadenopathy.   EXAMINATION:  Temperature 97.4, blood pressure 140/84, pulse 68, weight  177 pounds.  GENERAL APPEARANCE:  This is a heavyset Caucasian woman in no acute  distress.  HEENT:  Unremarkable.  NECK:  The patient has full range of motion.  NEUROLOGIC:  The patient has normal grip strength.  Normal upper  extremity strength.  Normal lower extremity strength.  DTRs are 2+ and  symmetrical at the biceps, radial, and patellar tendons.  The patient  has full range of motion of her shoulders.  The patient had normal  sensation to light touch, pinprick, and deep vibratory sensation in her  upper extremities.  She had mildly decreased deep vibratory sensation at  her feet and ankles.  The patient has no edema.  She had 2+ dorsalis  pedis pulses.   ASSESSMENT AND PLAN:  1. Upper extremity paresthesia, transient.  The patient had a normal      neurological examination today.  I would be concerned for      underlying cause of her paresthesia being cervical spine disease.      Would need to rule out also metabolic causes.  Plan, the patient      for B12, TSH.  The patient is given a prescription for flexion and      extension C spine films to rule out degenerative joint disease and      degenerative disk disease.  If study is abnormal, we need to      consider moving forward with MRI scanning.  2. Right lower  extremity heaviness.  The patient has good circulation.      Her examination was nonfocal, except for decreased deep vibratory      sensation, which was bilateral.  Plan is to get the patient      metabolic rule out as above.  Will also order LS spine films.  3. Genitourinary.  The patient with an episode of painless hematuria.      I discussed my concern for this being a sentinel symptom and would      recommend that she have a full genitourinary evaluation, including      possible cystoscopy.   The patient will be referred to Dr. Barron Alvine for this evaluation.   The patient will be notified by phone tree of her lab and x-ray results  with plan to move forward based on these studies.     Rosalyn Gess Norins, MD  Electronically Signed    MEN/MedQ  DD: 04/11/2006  DT: 04/12/2006  Job #: [id number]   cc:   Arva Chafe

## 2010-06-10 NOTE — Letter (Signed)
April 11, 2006    Valetta Fuller, M.D.  [address]  [place], [place] [address]   RE:  [name], [name]  MRN:  [id number]  /  DOB:  18-Apr-1945   Dear Vanessa Fuentes:   Thank you very much for seeing and evaluating [name] for an  episode of painless hematuria.   The patient reports that at a company-sponsored physical exam she was  found to have microscopic hematuria.  She reports she has had no  symptoms, specifically denying polyuria, dysuria, post-micturition  bladder spasm, frequency, urgency, nocturia, or any other symptom.  The  practitioner who administered her examination did treat her with  antibiotics for possible cystitis, although the patient never had  symptoms.   I have discussed with the patient my concern in a 65 year old woman with  painless hematuria may have had a sentinel symptom, and that she would  deserve further evaluation.   Please find enclosed my dictation from today on seeing the patient, as  well as a baseline note from February 06, 2005.   If I can provide any additional information, please do not hesitate to  contact me.   ENCLOSURES:  Science writer from February 06, 2005  Dictation from April 11, 2006    Sincerely,      Rosalyn Gess. Norins, MD  Electronically Signed    MEN/MedQ  DD: 04/11/2006  DT: 04/12/2006  Job #: [id number]

## 2010-08-04 ENCOUNTER — Encounter: Payer: Self-pay | Admitting: Internal Medicine

## 2010-08-04 DIAGNOSIS — Z0000 Encounter for general adult medical examination without abnormal findings: Secondary | ICD-10-CM | POA: Insufficient documentation

## 2010-08-05 ENCOUNTER — Ambulatory Visit (INDEPENDENT_AMBULATORY_CARE_PROVIDER_SITE_OTHER): Payer: 59 | Admitting: Internal Medicine

## 2010-08-05 ENCOUNTER — Encounter: Payer: Self-pay | Admitting: Internal Medicine

## 2010-08-05 VITALS — BP 128/74 | HR 70 | Temp 98.5°F | Resp 14 | Wt 164.2 lb

## 2010-08-05 DIAGNOSIS — M25472 Effusion, left ankle: Secondary | ICD-10-CM | POA: Insufficient documentation

## 2010-08-05 DIAGNOSIS — M25473 Effusion, unspecified ankle: Secondary | ICD-10-CM

## 2010-08-05 DIAGNOSIS — I10 Essential (primary) hypertension: Secondary | ICD-10-CM

## 2010-08-05 DIAGNOSIS — M25476 Effusion, unspecified foot: Secondary | ICD-10-CM

## 2010-08-05 MED ORDER — PREDNISONE 10 MG PO TABS: 10.0000 mg | ORAL_TABLET | Freq: Every day | ORAL | Status: AC

## 2010-08-05 NOTE — Patient Instructions (Signed)
Take all new medications as prescribed Continue all other medications as before Please call in 3-4 days if not improved, as we could refer to Dr Electa Sniff

## 2010-08-07 ENCOUNTER — Encounter: Payer: Self-pay | Admitting: Internal Medicine

## 2010-08-07 NOTE — Progress Notes (Signed)
  Subjective:    Patient ID: Vanessa Fuentes, female    DOB: [birth year], 65 y.o.   MRN: [id number]  HPI  Here with acuteonset left ankle swelling x 4 days, pt does not remember twist or other trauma, no fever, hx of gout or DJD.  Swelling seems to be a bit worse to the lateral aspect but no bruising, and no prior hx of similar.  Has some flushed feeling occasionally but Pt denies chest pain, increased sob or doe, wheezing, orthopnea, PND, increased LE swelling, palpitations, dizziness or syncope.  Pt denies new neurological symptoms such as new headache, or facial or extremity weakness or numbness   Pt denies polydipsia, polyuria. Past Medical History  Diagnosis Date  . Spinal stenosis in cervical region   . Cervicalgia   . Brachial neuritis or radiculitis NOS   . Unspecified essential hypertension   . Degeneration of lumbar or lumbosacral intervertebral disc   . Degeneration of cervical intervertebral disc   . Dysfunction of eustachian tube   . Personal history of unspecified circulatory disease   . Chest pain, unspecified    Past Surgical History  Procedure Date  . Tubal ligation   . Colon surgery     Polypectomy  . Cervical discectomy 07/2009    C3-4 with fusion- Dr Claudina Lick  . Nasal septum surgery 01/2009    [facility]    reports that she has quit smoking. She does not have any smokeless tobacco history on file. Her alcohol and drug histories not on file. family history includes Cancer in her maternal aunt; Heart disease in her brother, father, and mother; and Hypertension in her father and mother.  There is no history of Diabetes. Allergies  Allergen Reactions  . Sulfonamide Derivatives    Current Outpatient Prescriptions on File Prior to Visit  Medication Sig Dispense Refill  . furosemide (LASIX) 20 MG tablet Take 20 mg by mouth daily.          Review of Systems Review of Systems  Constitutional: Negative for diaphoresis and unexpected weight change.  HENT: Negative for  drooling and tinnitus.   Eyes: Negative for photophobia and visual disturbance.  Respiratory: Negative for choking and stridor.   Musculoskeletal: Negative for gait problem.  Skin: Negative for color change and wound.      Objective:   Physical Exam BP 128/74  Pulse 70  Temp(Src) 98.5 F (36.9 C) (Oral)  Resp 14  Wt 164 lb 4 oz (74.503 kg)  SpO2 95% Physical Exam  VS noted Constitutional: Pt appears well-developed and well-nourished.  HENT: Head: Normocephalic.  Right Ear: External ear normal.  Left Ear: External ear normal.  Eyes: Conjunctivae and EOM are normal. Pupils are equal, round, and reactive to light.  Neck: Normal range of motion. Neck supple.  Cardiovascular: Normal rate and regular rhythm.   Pulmonary/Chest: Effort normal and breath sounds normal.  LE:  No edema Left ankle with 1+ swelling primarily lateral aspect but also some medial, o/w neruovasc intact, no bruising   Assessment & Plan:

## 2010-08-07 NOTE — Assessment & Plan Note (Signed)
stable overall by hx and exam, most recent data reviewed with pt, and pt to continue medical treatment as before  BP Readings from Last 3 Encounters:  08/05/10 128/74  12/22/09 142/74  05/20/09 138/88

## 2010-08-07 NOTE — Assessment & Plan Note (Signed)
Left ankle mild discomfort - diff includes gout vs djd vs sprain - for predpack asd, considuer ortho if not improved,  to f/u any worsening symptoms or concerns

## 2010-12-25 ENCOUNTER — Other Ambulatory Visit: Payer: Self-pay | Admitting: Internal Medicine

## 2011-02-01 ENCOUNTER — Encounter: Payer: Self-pay | Admitting: Internal Medicine

## 2011-02-17 ENCOUNTER — Ambulatory Visit (AMBULATORY_SURGERY_CENTER): Payer: No Typology Code available for payment source | Admitting: *Deleted

## 2011-02-17 VITALS — Ht 65.0 in | Wt 166.4 lb

## 2011-02-17 DIAGNOSIS — Z1211 Encounter for screening for malignant neoplasm of colon: Secondary | ICD-10-CM

## 2011-02-17 MED ORDER — PEG-KCL-NACL-NASULF-NA ASC-C 100 G PO SOLR: ORAL | Status: DC

## 2011-03-03 ENCOUNTER — Encounter: Payer: Self-pay | Admitting: Internal Medicine

## 2011-03-03 ENCOUNTER — Ambulatory Visit (AMBULATORY_SURGERY_CENTER): Payer: No Typology Code available for payment source | Admitting: Internal Medicine

## 2011-03-03 DIAGNOSIS — D126 Benign neoplasm of colon, unspecified: Secondary | ICD-10-CM

## 2011-03-03 DIAGNOSIS — Z1211 Encounter for screening for malignant neoplasm of colon: Secondary | ICD-10-CM

## 2011-03-03 MED ORDER — SODIUM CHLORIDE 0.9 % IV SOLN: 500.0000 mL | INTRAVENOUS | Status: DC

## 2011-03-03 NOTE — Op Note (Signed)
[facility] [address] [place], [place]  [address]  COLONOSCOPY PROCEDURE REPORT  PATIENT:  Vanessa, Fuentes  MR#:  [id number] BIRTHDATE:  12/27/1945, 65 yrs. old  GENDER:  female ENDOSCOPIST:  Hedwig Morton. Juanda Chance, MD REF. BY: PROCEDURE DATE:  03/03/2011 PROCEDURE:  Colonoscopy with snare polypectomy ASA CLASS:  Class II INDICATIONS:  history of hyperplastic polyps 2004 colon hyperplastic polyp MEDICATIONS:   These medications were titrated to patient response per physician's verbal order, MAC sedation, administered by CRNA, propofol (Diprivan) 250 mg  DESCRIPTION OF PROCEDURE:   After the risks and benefits and of the procedure were explained, informed consent was obtained. Digital rectal exam was performed and revealed no rectal masses. The LB 180AL [id number] endoscope was introduced through the anus and advanced to the cecum, which was identified by both the appendix and ileocecal valve.  The quality of the prep was good, using MoviPrep.  The instrument was then slowly withdrawn as the colon was fully examined. <<PROCEDUREIMAGES>>  FINDINGS:  A sessile polyp was found. at 75 cm 10 mm polyp Polyp was snared, then cauterized with monopolar cautery. Retrieval was successful (see image7 and image6). snare polyp  This was otherwise a normal examination of the colon (see image8, image5, image4, image3, image1, and image2).   Retroflexed views in the rectum revealed no abnormalities.    The scope was then withdrawn from the patient and the procedure completed.  COMPLICATIONS:  None ENDOSCOPIC IMPRESSION: 1) Sessile polyp 2) Otherwise normal examination RECOMMENDATIONS: 1) Await pathology results 2) High fiber diet.  REPEAT EXAM:  In 5 year(s) for.  ______________________________ Hedwig Morton. Juanda Chance, MD  CC:  n. eSIGNED:   Hedwig Morton. Kortni Hasten at 03/03/2011 08:41 AM  Arva Chafe, [phone]

## 2011-03-03 NOTE — Patient Instructions (Signed)
Please review discharge instructions (blue and green sheets)  Await pathology- 1 polyp removed  Resume your normal medications

## 2011-03-03 NOTE — Progress Notes (Signed)
Patient did not have preoperative order for IV antibiotic SSI prophylaxis. (G8918)  Patient did not experience any of the following events: a burn prior to discharge; a fall within the facility; wrong site/side/patient/procedure/implant event; or a hospital transfer or hospital admission upon discharge from the facility. (G8907)  

## 2011-03-06 ENCOUNTER — Telehealth: Payer: Self-pay

## 2011-03-06 NOTE — Telephone Encounter (Signed)
  Follow up Call-  Call back number 03/03/2011  Post procedure Call Back phone  # [phone]  Permission to leave phone message Yes     Patient questions:  Do you have a fever, pain , or abdominal swelling? no Pain Score  0 *  Have you tolerated food without any problems? yes  Have you been able to return to your normal activities? yes  Do you have any questions about your discharge instructions: Diet   no Medications  no Follow up visit  no  Do you have questions or concerns about your Care? no  Actions: * If pain score is 4 or above: No action needed, pain <4.

## 2011-03-08 IMAGING — RF DG CERVICAL SPINE 1V
1 series · 1 of 1 positions shown · non-contrast
Comparison: [HOSPITAL] cervical spine MRI 05/18/2009.

CLINICAL DATA: HNP with C3-4 ACDF

Fluoroscopy time:  2 seconds.
CERVICAL SPINE - 1 VIEW

[Series 1: run · 1 of 1 slices shown]
[im 1/1]
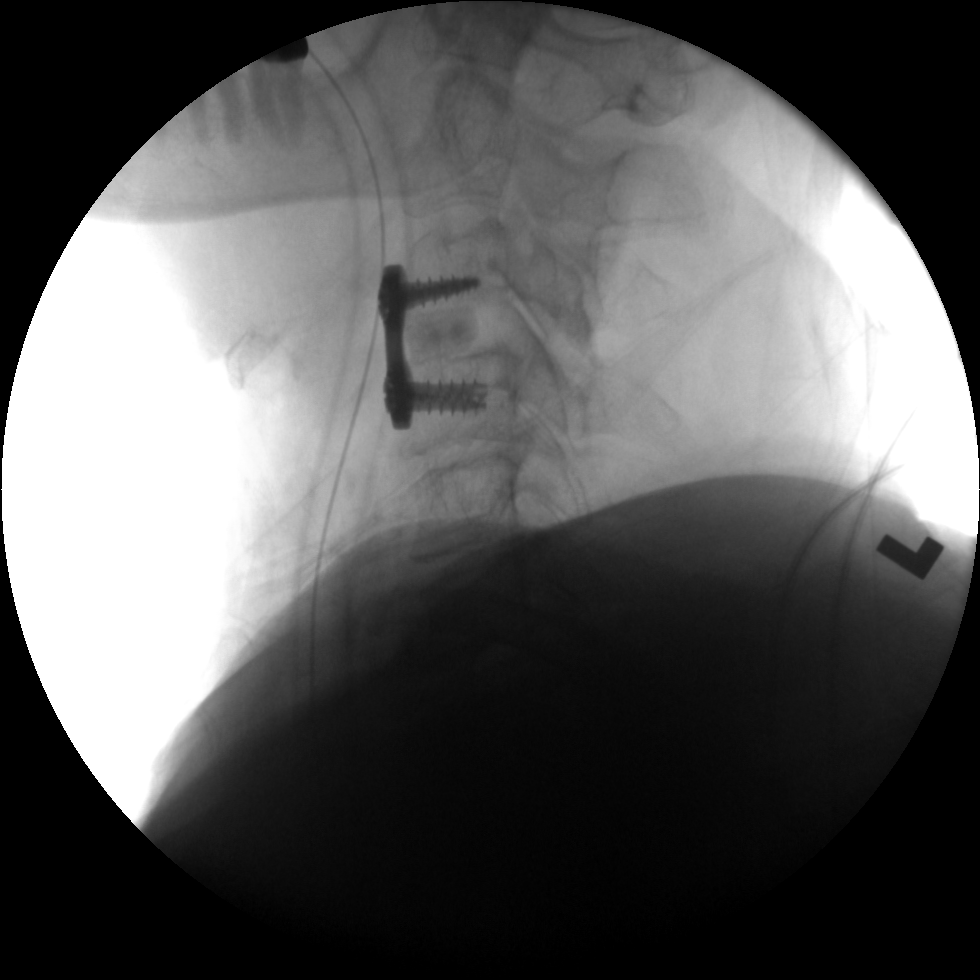

[1 of 1 positions shown; findings below may reference images not displayed]

FINDINGS: Interval satisfactory placement anterior cervical
discectomy fusion hardware and interbody bone plug C3-4 visualized.
IMPRESSION: Satisfactory ACDF C3-4.

## 2011-03-13 ENCOUNTER — Encounter: Payer: Self-pay | Admitting: Internal Medicine

## 2011-04-13 ENCOUNTER — Encounter: Payer: Self-pay | Admitting: Internal Medicine

## 2011-04-13 ENCOUNTER — Other Ambulatory Visit (INDEPENDENT_AMBULATORY_CARE_PROVIDER_SITE_OTHER): Payer: No Typology Code available for payment source

## 2011-04-13 ENCOUNTER — Ambulatory Visit (INDEPENDENT_AMBULATORY_CARE_PROVIDER_SITE_OTHER): Payer: No Typology Code available for payment source | Admitting: Internal Medicine

## 2011-04-13 VITALS — BP 118/80 | HR 70 | Temp 97.4°F | Resp 16 | Ht 63.0 in | Wt 163.5 lb

## 2011-04-13 DIAGNOSIS — R899 Unspecified abnormal finding in specimens from other organs, systems and tissues: Secondary | ICD-10-CM

## 2011-04-13 DIAGNOSIS — I10 Essential (primary) hypertension: Secondary | ICD-10-CM

## 2011-04-13 DIAGNOSIS — M5137 Other intervertebral disc degeneration, lumbosacral region: Secondary | ICD-10-CM

## 2011-04-13 DIAGNOSIS — R6889 Other general symptoms and signs: Secondary | ICD-10-CM

## 2011-04-13 DIAGNOSIS — E785 Hyperlipidemia, unspecified: Secondary | ICD-10-CM

## 2011-04-13 DIAGNOSIS — Z0000 Encounter for general adult medical examination without abnormal findings: Secondary | ICD-10-CM

## 2011-04-13 LAB — LIPID PANEL: Triglycerides: 154 mg/dL — ABNORMAL HIGH (ref 0.0–149.0)

## 2011-04-13 LAB — COMPREHENSIVE METABOLIC PANEL
Albumin: 4.4 g/dL (ref 3.5–5.2)
CO2: 27 mEq/L (ref 19–32)
GFR: 86.34 mL/min (ref >60.00)
Glucose, Bld: 98 mg/dL (ref 70–99)
Potassium: 4.7 mEq/L (ref 3.5–5.1)
Sodium: 135 mEq/L (ref 135–145)
Total Bilirubin: 0.5 mg/dL (ref 0.3–1.2)
Total Protein: 7.2 g/dL (ref 6.0–8.3)

## 2011-04-13 LAB — VITAMIN B12: Vitamin B-12: 293 pg/mL (ref 211–911)

## 2011-04-13 NOTE — Progress Notes (Signed)
Subjective:    Patient ID: Vanessa Fuentes, female    DOB: 23-Apr-1945, 66 y.o.   MRN: [id number]  HPI The patient is here for annual  wellness examination and management of other chronic and acute problems. She is feeling well and has really enjoyed retirement: cooking and family stuff and a little travel.    The risk factors are reflected in the social history.  The roster of all physicians providing medical care to patient - is listed in the Snapshot section of the chart.  Activities of daily living:  The patient is 100% inedpendent in all ADLs: dressing, toileting, feeding as well as independent mobility  Home safety : The patient has smoke detectors in the home. Falls - house is fall safe. They wear seatbelts.  firearms are present in the home, kept in a safe fashion. There is no violence in the home.   There is no risks for hepatitis, STDs or HIV. There is no   history of blood transfusion. They have no travel history to infectious disease endemic areas of the world.  The patient has seen their dentist in the last six month. They have seen their eye doctor in the last year. They deny any hearing difficulty and have not had audiologic testing in the last year.  They do not  have excessive sun exposure. Discussed the need for sun protection: hats, long sleeves and use of sunscreen if there is significant sun exposure.   Diet: the importance of a healthy diet is discussed. They do have a healthy diet.  The patient has a regular exercise program: cardio/ walking ,  60 min duration,  4 per week.  The benefits of regular aerobic exercise were discussed.  Depression screen: there are no signs or vegative symptoms of depression- irritability, change in appetite, anhedonia, sadness/tearfullness.  Cognitive assessment: the patient manages all their financial and personal affairs and is actively engaged.  The following portions of the patient's history were reviewed and updated as  appropriate: allergies, current medications, past family history, past medical history,  past surgical history, past social history  and problem list.  Vision, hearing, body mass index were assessed and reviewed.   During the course of the visit the patient was educated and counseled about appropriate screening and preventive services including : fall prevention , diabetes screening, nutrition counseling, colorectal cancer screening, and recommended immunizations.  Past Medical History  Diagnosis Date  . Spinal stenosis in cervical region   . Cervicalgia   . Brachial neuritis or radiculitis NOS   . Unspecified essential hypertension   . Degeneration of lumbar or lumbosacral intervertebral disc   . Degeneration of cervical intervertebral disc   . Dysfunction of eustachian tube    Past Surgical History  Procedure Date  . Tubal ligation   . Cervical discectomy 07/2009    C3-4 with fusion- Dr Claudina Lick  . Nasal septum surgery 01/2009    [facility]  . Colonoscopy     polypectomy   Family History  Problem Relation Age of Onset  . Hypertension Mother   . Heart disease Mother     CAD MI CABG  . Heart disease Father     CAD MI  . Hypertension Father   . Heart disease Brother     CAD  . Cancer Maternal Aunt     Breast  . Diabetes Neg Hx   . Colon cancer Neg Hx   . Stomach cancer Neg Hx   . COPD Neg Hx  History   Social History  . Marital Status: Married    Spouse Name: N/A    Number of Children: N/A  . Years of Education: N/A   Occupational History  . Not on file.   Social History Main Topics  . Smoking status: Former Smoker    Quit date: 02/16/1981  . Smokeless tobacco: Never Used   Comment: During Adolescence  . Alcohol Use: 1.0 oz/week    2 drink(s) per week  . Drug Use: No  . Sexually Active: Yes -- Female partner(s)   Other Topics Concern  . 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 Married in 1969, one daughter-'73 and 2 grandchildren.   Work- retired from  [employer] October '12 after 43 years. Life is good.        Review of Systems Constitutional:  Negative for fever, chills, activity change and unexpected weight change.  HEENT:  Negative for hearing loss, ear pain, congestion, neck stiffness and postnasal drip. Negative for sore throat or swallowing problems. Negative for dental complaints.   Eyes: Negative for vision loss or change in visual acuity.  Respiratory: Negative for chest tightness and wheezing. Negative for DOE.   Cardiovascular: Negative for chest pain or palpitations. No decreased exercise tolerance Gastrointestinal: No change in bowel habit. No bloating or gas. No reflux or indigestion Genitourinary: Negative for urgency, frequency, flank pain and difficulty urinating. occasional stress incontinence Musculoskeletal: Negative for myalgias, arthralgias and gait problem. Chronic back pain.  Neurological: Negative for dizziness, tremors, weakness and headaches.  Hematological: Negative for adenopathy.  Psychiatric/Behavioral: Negative for behavioral problems and dysphoric mood.       Objective:   Physical Exam Filed Vitals:   04/13/11 1033  BP: 118/80  Pulse: 70  Temp: 97.4 F (36.3 C)  Resp: 16   Wt Readings from Last 3 Encounters:  04/13/11 163 lb 8 oz (74.163 kg)  03/03/11 166 lb (75.297 kg)  02/17/11 166 lb 6.4 oz (75.479 kg)    Gen'l: well nourished, well developed white woman in no distress HEENT - [place]/AT, EACs/TMs normal, oropharynx with native dentition in good condition, no buccal or palatal lesions, posterior pharynx clear, mucous membranes moist. C&S clear, PERRLA, fundi - normal Neck - supple, no thyromegaly Nodes- negative submental, cervical, supraclavicular regions Chest - no deformity, no CVAT Lungs - cleat without rales, wheezes. No increased work of breathing Breast - deferred to gyn Cardiovascular - regular rate and rhythm, quiet precordium, no murmurs, rubs or gallops, 2+  radial, DP and PT pulses Abdomen - BS+ x 4, no HSM, no guarding or rebound or tenderness Pelvic - deferred to gyn Rectal - deferred to gyn Extremities - no clubbing, cyanosis, edema or deformity.  Neuro - A&O x 3, CN II-XII normal, motor strength normal and equal, DTRs 2+ and symmetrical biceps, radial, and patellar tendons. Cerebellar - no tremor, no rigidity, fluid movement and normal gait. Derm - Head, neck, back, abdomen and extremities without suspicious lesions. MIld nail fungus great nail left foot.   Lab Results  Component Value Date   WBC 7.1 08/04/2009   HGB 15.8* 08/04/2009   HCT 45.8 08/04/2009   PLT 176 08/04/2009   GLUCOSE 98 04/13/2011   CHOL 232* 04/13/2011   TRIG 154.0* 04/13/2011   HDL 69.20 04/13/2011   LDLDIRECT 148.3 04/13/2011   ALT 23 04/13/2011   AST 22 04/13/2011   NA 135 04/13/2011   K 4.7 04/13/2011   CL 98 04/13/2011  CREATININE 0.7 04/13/2011   BUN 12 04/13/2011   CO2 27 04/13/2011   TSH 1.00 04/11/2006         Assessment & Plan:

## 2011-04-14 DIAGNOSIS — E785 Hyperlipidemia, unspecified: Secondary | ICD-10-CM | POA: Insufficient documentation

## 2011-04-14 NOTE — Assessment & Plan Note (Signed)
BP Readings from Last 3 Encounters:  04/13/11 118/80  03/03/11 134/85  08/05/10 128/74   Good control on present medications. Electrolytes and renal function normal.  Plan - continue present regimen

## 2011-04-14 NOTE — Assessment & Plan Note (Signed)
Progression in elevation of LDL cholesterol: 127 to 148.3, HDL remains robust @ 69. [employer] Cardiac risk calculation: 2% risk of a cardiac event in the next 10 years - low risk.  In light of LDL being below treatment threshold - NCEP/ATP III and with low cardiac risk will pursue life-style management only.  Plan - low fat diet and increased aerobic exercise.

## 2011-04-14 NOTE — Assessment & Plan Note (Signed)
Interval history is benign. Physical exam sans breast and pelvic is normal. Lab - normal except for elevated LDL cholesterol level (see above). She is current with colorectal and breast cancer screening. Immunizations - up to date except for pneumonia vaccine.  IN summary - a very nice woman, happily retired, who is medically stable and doing well. She is encouraged to continue her healthy life-style. She will return as needed or in 12-18 months.

## 2011-04-14 NOTE — Assessment & Plan Note (Signed)
Stable with no limitations in her activities.

## 2011-06-23 ENCOUNTER — Other Ambulatory Visit: Payer: Self-pay | Admitting: Internal Medicine

## 2011-11-11 ENCOUNTER — Other Ambulatory Visit: Payer: Self-pay | Admitting: Internal Medicine

## 2012-04-22 ENCOUNTER — Encounter: Payer: Self-pay | Admitting: Internal Medicine

## 2012-04-22 ENCOUNTER — Ambulatory Visit (INDEPENDENT_AMBULATORY_CARE_PROVIDER_SITE_OTHER): Payer: BC Managed Care – PPO | Admitting: Internal Medicine

## 2012-04-22 VITALS — BP 152/90 | HR 83 | Temp 97.8°F | Resp 16 | Ht 65.0 in | Wt 162.6 lb

## 2012-04-22 DIAGNOSIS — H6983 Other specified disorders of Eustachian tube, bilateral: Secondary | ICD-10-CM

## 2012-04-22 DIAGNOSIS — H6980 Other specified disorders of Eustachian tube, unspecified ear: Secondary | ICD-10-CM

## 2012-04-22 DIAGNOSIS — H6120 Impacted cerumen, unspecified ear: Secondary | ICD-10-CM

## 2012-04-22 DIAGNOSIS — H6123 Impacted cerumen, bilateral: Secondary | ICD-10-CM

## 2012-04-22 NOTE — Patient Instructions (Addendum)
Eustachian tube dysfunction  Plan Pseudoephedrine 30 mg three times a day.   Barotitis Media Barotitis media is soreness (inflammation) of the area behind the eardrum (middle ear). This occurs when the auditory tube (Eustachian tube) leading from the back of the throat to the eardrum is blocked. When it is blocked air cannot move in and out of the middle ear to equalize pressure changes. These pressure changes come from changes in altitude when:  Flying.  Driving in the mountains.  Diving. Problems are more likely to occur with pressure changes during times when you are congested as from:  Hay fever.  Upper respiratory infection.  A cold. Damage or hearing loss (barotrauma) caused by this may be permanent. HOME CARE INSTRUCTIONS   Use medicines as recommended by your caregiver. Over the counter medicines will help unblock the canal and can help during times of air travel.  Do not put anything into your ears to clean or unplug them. Eardrops will not be helpful.  Do not swim, dive, or fly until your caregiver says it is all right to do so. If these activities are necessary, chewing gum with frequent swallowing may help. It is also helpful to hold your nose and gently blow to pop your ears for equalizing pressure changes. This forces air into the Eustachian tube.  For little ones with problems, give your baby a bottle of water or juice during periods when pressure changes would be anticipated such as during take offs and landings associated with air travel.  Only take over-the-counter or prescription medicines for pain, discomfort, or fever as directed by your caregiver.  A decongestant may be helpful in de-congesting the middle ear and make pressure equalization easier. This can be even more effective if the drops (spray) are delivered with the head lying over the edge of a bed with the head tilted toward the ear on the affected side.  If your caregiver has given you a follow-up  appointment, it is very important to keep that appointment. Not keeping the appointment could result in a chronic or permanent injury, pain, hearing loss and disability. If there is any problem keeping the appointment, you must call back to this facility for assistance. SEEK IMMEDIATE MEDICAL CARE IF:   You develop a severe headache, dizziness, severe ear pain, or bloody or pus-like drainage from your ears.  An oral temperature above 102 F (38.9 C) develops.  Your problems do not improve or become worse. MAKE SURE YOU:   Understand these instructions.  Will watch your condition.  Will get help right away if you are not doing well or get worse. Document Released: 01/07/2000 Document Revised: 04/03/2011 Document Reviewed: 08/15/2007 [facility] Patient Information 2013 [place], [place].

## 2012-04-22 NOTE — Progress Notes (Signed)
Subjective:    Patient ID: Vanessa Fuentes, female    DOB: 1945-07-25, 67 y.o.   MRN: [id number]  HPI 8 days ago Vanessa Fuentes had a clogged congested ear with minor discomfort. She went to [facility] and was diagnosed with otitis and was started on Amox and antipyrine-benzocaine.  She has continued to have ear congestion and today had increased congestion and she is having more discomfort.   Past Medical History  Diagnosis Date  . Spinal stenosis in cervical region   . Cervicalgia   . Brachial neuritis or radiculitis NOS   . Unspecified essential hypertension   . Degeneration of lumbar or lumbosacral intervertebral disc   . Degeneration of cervical intervertebral disc   . Dysfunction of eustachian tube    Past Surgical History  Procedure Laterality Date  . Tubal ligation    . Cervical discectomy  07/2009    C3-4 with fusion- Dr Claudina Lick  . Nasal septum surgery  01/2009    [facility]  . Colonoscopy      polypectomy   Family History  Problem Relation Age of Onset  . Hypertension Mother   . Heart disease Mother     CAD MI CABG  . Heart disease Father     CAD MI  . Hypertension Father   . Heart disease Brother     CAD  . Cancer Maternal Aunt     Breast  . Diabetes Neg Hx   . Colon cancer Neg Hx   . Stomach cancer Neg Hx   . COPD Neg Hx    History   Social History  . Marital Status: Married    Spouse Name: N/A    Number of Children: N/A  . Years of Education: N/A   Occupational History  . Not on file.   Social History Main Topics  . Smoking status: Former Smoker    Quit date: 02/16/1981  . Smokeless tobacco: Never Used     Comment: During Adolescence  . Alcohol Use: 1.0 oz/week    2 drink(s) per week  . Drug Use: No  . Sexually Active: Yes -- Female partner(s)   Other Topics Concern  . 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 Married in 1969, one daughter-'73 and 2 grandchildren.  Work- retired from  [employer] October '12 after 43  years. Life is good.     Current Outpatient Prescriptions on File Prior to Visit  Medication Sig Dispense Refill  . furosemide (LASIX) 20 MG tablet take 1 tablet by mouth once daily  30 tablet  6  . Naproxen Sodium (ALEVE PO) Take by mouth.       No current facility-administered medications on file prior to visit.      Review of Systems .menrosb     Objective:   Physical Exam Filed Vitals:   04/22/12 1606  BP: 152/90  Pulse: 83  Temp: 97.8 F (36.6 C)  Resp: 16   Wt Readings from Last 3 Encounters:  04/22/12 162 lb 9.6 oz (73.755 kg)  04/13/11 163 lb 8 oz (74.163 kg)  03/03/11 166 lb (75.297 kg)   Gen';l - WNWD white woman in no distress HEENT - mild cerumen in both ears, R>L, TM  Left appears normal Neck - supple Cor - RRR Pulm - normal respirations       Assessment & Plan:  1.Eustachian tube dysfunction  Plan Pseudoephedrine 30 mg three times a day.   2. Cerumen impaction  Plan  Irrigation: cleared w/o complication

## 2012-04-23 ENCOUNTER — Telehealth: Payer: Self-pay | Admitting: Internal Medicine

## 2012-04-23 NOTE — Telephone Encounter (Signed)
Sudafed 30 mg two or three times a day was suggested - this does not require a Rx.  Scopolamine patch - #4, for cruise, apply every 72 hours. This will cover a 12 day cruise.

## 2012-04-23 NOTE — Telephone Encounter (Signed)
Pt called req Sudafed and sea sickness patch. Please call this in to [facility] on [address] if this is ok.

## 2012-04-24 MED ORDER — SCOPOLAMINE 1 MG/3DAYS TD PT72: 1.0000 | MEDICATED_PATCH | TRANSDERMAL | Status: DC

## 2012-04-24 NOTE — Telephone Encounter (Signed)
Spoke to pt and let her know scopolamine patch has been routed to [facility] on [address] and Sudafed she can just get over the counter. She expressed understanding and thanks and had no further concerns.

## 2012-04-24 NOTE — Telephone Encounter (Signed)
LM for pt to call back. Scopolamine patch already routed to [facility].

## 2012-05-24 ENCOUNTER — Other Ambulatory Visit: Payer: Self-pay | Admitting: Internal Medicine

## 2012-05-24 ENCOUNTER — Telehealth: Payer: Self-pay | Admitting: Internal Medicine

## 2012-05-24 DIAGNOSIS — I10 Essential (primary) hypertension: Secondary | ICD-10-CM

## 2012-05-24 DIAGNOSIS — E785 Hyperlipidemia, unspecified: Secondary | ICD-10-CM

## 2012-05-24 NOTE — Telephone Encounter (Signed)
done

## 2012-05-24 NOTE — Telephone Encounter (Signed)
Pt wants to be referred to [facility].  Her insurance will pay for this.

## 2012-05-24 NOTE — Telephone Encounter (Signed)
Pt is aware of the referral

## 2012-07-15 ENCOUNTER — Encounter: Payer: Self-pay | Admitting: Internal Medicine

## 2012-07-15 ENCOUNTER — Other Ambulatory Visit (INDEPENDENT_AMBULATORY_CARE_PROVIDER_SITE_OTHER): Payer: BC Managed Care – PPO

## 2012-07-15 ENCOUNTER — Ambulatory Visit (INDEPENDENT_AMBULATORY_CARE_PROVIDER_SITE_OTHER): Payer: BC Managed Care – PPO | Admitting: Internal Medicine

## 2012-07-15 VITALS — BP 120/78 | HR 60 | Temp 98.4°F | Ht 65.0 in | Wt 159.5 lb

## 2012-07-15 DIAGNOSIS — R197 Diarrhea, unspecified: Secondary | ICD-10-CM

## 2012-07-15 DIAGNOSIS — R1031 Right lower quadrant pain: Secondary | ICD-10-CM

## 2012-07-15 DIAGNOSIS — R109 Unspecified abdominal pain: Secondary | ICD-10-CM

## 2012-07-15 LAB — COMPREHENSIVE METABOLIC PANEL WITH GFR
ALT: 53 U/L — ABNORMAL HIGH (ref 0–35)
AST: 34 U/L (ref 0–37)
Albumin: 4.2 g/dL (ref 3.5–5.2)
Alkaline Phosphatase: 100 U/L (ref 39–117)
BUN: 12 mg/dL (ref 6–23)
CO2: 30 meq/L (ref 19–32)
Calcium: 10 mg/dL (ref 8.4–10.5)
Chloride: 100 meq/L (ref 96–112)
Creatinine, Ser: 0.8 mg/dL (ref 0.4–1.2)
GFR: 75.07 mL/min
Glucose, Bld: 102 mg/dL — ABNORMAL HIGH (ref 70–99)
Potassium: 4.4 meq/L (ref 3.5–5.1)
Sodium: 138 meq/L (ref 135–145)
Total Bilirubin: 0.6 mg/dL (ref 0.3–1.2)
Total Protein: 7.4 g/dL (ref 6.0–8.3)

## 2012-07-15 LAB — CBC
HCT: 42.3 % (ref 36.0–46.0)
Hemoglobin: 14.4 g/dL (ref 12.0–15.0)
MCHC: 34.1 g/dL (ref 30.0–36.0)
MCV: 91.3 fl (ref 78.0–100.0)
Platelets: 242 10*3/uL (ref 150.0–400.0)
RBC: 4.63 Mil/uL (ref 3.87–5.11)
RDW: 12.9 % (ref 11.5–14.6)
WBC: 6.8 10*3/uL (ref 4.5–10.5)

## 2012-07-15 NOTE — Progress Notes (Signed)
Subjective:    Patient ID: Vanessa Fuentes, female    DOB: 1945/05/16, 67 y.o.   MRN: [id number]  HPI  Pt presents to the clinic today with c/o diarrhea. This started 1 week ago. She is having 3 loose stools per day. There has been some associated nausea and abdominal cramping. She has not had any vomiting or change in the color of her stools. She has not changed her diet. She has not had antibiotics in th last 6 weeks. She denies fever, chills or body aches. She has had a colonoscopy in 2013. They did see some polyps but no s/s of diverticulosis. The diarrhea and abdominal cramping does seem to be getting better. She has not taken any OTC medications.   Review of Systems  Past Medical History  Diagnosis Date  . Spinal stenosis in cervical region   . Cervicalgia   . Brachial neuritis or radiculitis NOS   . Unspecified essential hypertension   . Degeneration of lumbar or lumbosacral intervertebral disc   . Degeneration of cervical intervertebral disc   . Dysfunction of eustachian tube     Current Outpatient Prescriptions  Medication Sig Dispense Refill  . amoxicillin (AMOXIL) 875 MG tablet Take 875 mg by mouth 2 (two) times daily.      Marland Kitchen antipyrine-benzocaine (AURALGAN) otic solution Place 2 drops into the left ear every 3 (three) hours as needed for pain.      . furosemide (LASIX) 20 MG tablet take 1 tablet by mouth once daily  30 tablet  6  . Naproxen Sodium (ALEVE PO) Take by mouth.      Marland Kitchen scopolamine (TRANSDERM-SCOP) 1.5 MG Place 1 patch (1.5 mg total) onto the skin every 3 (three) days.  4 patch  0   No current facility-administered medications for this visit.    Allergies  Allergen Reactions  . Sulfonamide Derivatives     Blisters eyes    Family History  Problem Relation Age of Onset  . Hypertension Mother   . Heart disease Mother     CAD MI CABG  . Heart disease Father     CAD MI  . Hypertension Father   . Heart disease Brother     CAD  . Cancer Maternal Aunt      Breast  . Diabetes Neg Hx   . Colon cancer Neg Hx   . Stomach cancer Neg Hx   . COPD Neg Hx     History   Social History  . Marital Status: Married    Spouse Name: N/A    Number of Children: N/A  . Years of Education: N/A   Occupational History  . Not on file.   Social History Main Topics  . Smoking status: Former Smoker    Quit date: 02/16/1981  . Smokeless tobacco: Never Used     Comment: During Adolescence  . Alcohol Use: 1.0 oz/week    2 drink(s) per week  . Drug Use: No  . Sexually Active: Yes -- Female partner(s)   Other Topics Concern  . 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 Married in 1969, one daughter-'73 and 2 grandchildren.  Work- retired from  [employer] October '12 after 43 years. Life is good.      Constitutional: Pt reports malaise. Denies fever, fatigue, headache or abrupt weight changes.  Gastrointestinal: Pt reports diarrhea and abdominal cramping. Denies bloating, constipation,  or blood in the stool.  GU: Denies urgency, frequency, pain  with urination, burning sensation, blood in urine, odor or discharge. Skin: Denies redness, rashes, lesions or ulcercations.     No other specific complaints in a complete review of systems (except as listed in HPI above).     Objective:   Physical Exam  BP 120/78  Pulse 60  Temp(Src) 98.4 F (36.9 C) (Oral)  Ht 5\' 5"  (1.651 m)  Wt 159 lb 8 oz (72.349 kg)  BMI 26.54 kg/m2  SpO2 95% Wt Readings from Last 3 Encounters:  07/15/12 159 lb 8 oz (72.349 kg)  04/22/12 162 lb 9.6 oz (73.755 kg)  04/13/11 163 lb 8 oz (74.163 kg)    General: Appears her stated age, well developed, well nourished in NAD. Skin: Warm, dry and intact. No rashes, lesions or ulcerations noted. Cardiovascular: Normal rate and rhythm. S1,S2 noted.  No murmur, rubs or gallops noted. No JVD or BLE edema. No carotid bruits noted. Pulmonary/Chest: Normal effort and positive vesicular breath sounds. No  respiratory distress. No wheezes, rales or ronchi noted.  Abdomen: Soft and tender in the RLQ. Normal bowel sounds, no bruits noted. No distention or masses noted. Liver, spleen and kidneys non palpable. Negative rebound tenderness.   BMET    Component Value Date/Time   NA 135 04/13/2011 1118   K 4.7 04/13/2011 1118   CL 98 04/13/2011 1118   CO2 27 04/13/2011 1118   GLUCOSE 98 04/13/2011 1118   BUN 12 04/13/2011 1118   CREATININE 0.7 04/13/2011 1118   CALCIUM 9.7 04/13/2011 1118   GFRNONAA >60 08/04/2009 0852   GFRAA  Value: >60        The eGFR has been calculated using the MDRD equation. This calculation has not been validated in all clinical situations. eGFR's persistently <60 mL/min signify possible Chronic Kidney Disease. 08/04/2009 0852    Lipid Panel     Component Value Date/Time   CHOL 232* 04/13/2011 1118   TRIG 154.0* 04/13/2011 1118   HDL 69.20 04/13/2011 1118   CHOLHDL 3 04/13/2011 1118   VLDL 30.8 04/13/2011 1118    CBC    Component Value Date/Time   WBC 7.1 08/04/2009 0852   RBC 5.09 08/04/2009 0852   HGB 15.8* 08/04/2009 0852   HCT 45.8 08/04/2009 0852   PLT 176 08/04/2009 0852   MCV 90.0 08/04/2009 0852   MCH 31.1 08/04/2009 0852   MCHC 34.5 08/04/2009 0852   RDW 13.1 08/04/2009 0852   LYMPHSABS 2.2 08/04/2009 0852   MONOABS 0.4 08/04/2009 0852   EOSABS 0.2 08/04/2009 0852   BASOSABS 0.0 08/04/2009 0852    Hgb A1C No results found for this basename: HGBA1C         Assessment & Plan:   Diarrhea and abdominal cramping, new onset:  Will check CBC, CMET If white count elevated will obtain CT of abd, r/o appendicitis Monitor symptoms, if worse, RTC for further evaluation Drink plenty of fluids to keep from getting dehydrated

## 2012-07-15 NOTE — Patient Instructions (Signed)
Diarrhea Diarrhea is frequent loose and watery bowel movements. It can cause you to feel weak and dehydrated. Dehydration can cause you to become tired and thirsty, have a dry mouth, and have decreased urination that often is dark yellow. Diarrhea is a sign of another problem, most often an infection that will not last long. In most cases, diarrhea typically lasts 2 3 days. However, it can last longer if it is a sign of something more serious. It is important to treat your diarrhea as directed by your caregive to lessen or prevent future episodes of diarrhea. CAUSES  Some common causes include:  Gastrointestinal infections caused by viruses, bacteria, or parasites.  Food poisoning or food allergies.  Certain medicines, such as antibiotics, chemotherapy, and laxatives.  Artificial sweeteners and fructose.  Digestive disorders. HOME CARE INSTRUCTIONS  Ensure adequate fluid intake (hydration): have 1 cup (8 oz) of fluid for each diarrhea episode. Avoid fluids that contain simple sugars or sports drinks, fruit juices, whole milk products, and sodas. Your urine should be clear or pale yellow if you are drinking enough fluids. Hydrate with an oral rehydration solution that you can purchase at pharmacies, retail stores, and online. You can prepare an oral rehydration solution at home by mixing the following ingredients together:    tsp table salt.   tsp baking soda.   tsp salt substitute containing potassium chloride.  1  tablespoons sugar.  1 L (34 oz) of water.  Certain foods and beverages may increase the speed at which food moves through the gastrointestinal (GI) tract. These foods and beverages should be avoided and include:  Caffeinated and alcoholic beverages.  High-fiber foods, such as raw fruits and vegetables, nuts, seeds, and whole grain breads and cereals.  Foods and beverages sweetened with sugar alcohols, such as xylitol, sorbitol, and mannitol.  Some foods may be well  tolerated and may help thicken stool including:  Starchy foods, such as rice, toast, pasta, low-sugar cereal, oatmeal, grits, baked potatoes, crackers, and bagels.  Bananas.  Applesauce.  Add probiotic-rich foods to help increase healthy bacteria in the GI tract, such as yogurt and fermented milk products.  Wash your hands well after each diarrhea episode.  Only take over-the-counter or prescription medicines as directed by your caregiver.  Take a warm bath to relieve any burning or pain from frequent diarrhea episodes. SEEK IMMEDIATE MEDICAL CARE IF:   You are unable to keep fluids down.  You have persistent vomiting.  You have blood in your stool, or your stools are black and tarry.  You do not urinate in 6 8 hours, or there is only a small amount of very dark urine.  You have abdominal pain that increases or localizes.  You have weakness, dizziness, confusion, or lightheadedness.  You have a severe headache.  Your diarrhea gets worse or does not get better.  You have a fever or persistent symptoms for more than 2 3 days.  You have a fever and your symptoms suddenly get worse. MAKE SURE YOU:   Understand these instructions.  Will watch your condition.  Will get help right away if you are not doing well or get worse. Document Released: 12/30/2001 Document Revised: 12/27/2011 Document Reviewed: 09/17/2011 [employer] Patient Information 2014 [employer]. Diet for Diarrhea, Adult Frequent, runny stools (diarrhea) may be caused or worsened by food or drink. Diarrhea may be relieved by changing your diet. Since diarrhea can last up to 7 days, it is easy for you to lose too much fluid   from the body and become dehydrated. Fluids that are lost need to be replaced. Along with a modified diet, make sure you drink enough fluids to keep your urine clear or pale yellow. DIET INSTRUCTIONS  Ensure adequate fluid intake (hydration): have 1 cup (8 oz) of fluid for each diarrhea  episode. Avoid fluids that contain simple sugars or sports drinks, fruit juices, whole milk products, and sodas. Your urine should be clear or pale yellow if you are drinking enough fluids. Hydrate with an oral rehydration solution that you can purchase at pharmacies, retail stores, and online. You can prepare an oral rehydration solution at home by mixing the following ingredients together:    tsp table salt.   tsp baking soda.   tsp salt substitute containing potassium chloride.  1  tablespoons sugar.  1 L (34 oz) of water.  Certain foods and beverages may increase the speed at which food moves through the gastrointestinal (GI) tract. These foods and beverages should be avoided and include:  Caffeinated and alcoholic beverages.  High-fiber foods, such as raw fruits and vegetables, nuts, seeds, and whole grain breads and cereals.  Foods and beverages sweetened with sugar alcohols, such as xylitol, sorbitol, and mannitol.  Some foods may be well tolerated and may help thicken stool including:  Starchy foods, such as rice, toast, pasta, low-sugar cereal, oatmeal, grits, baked potatoes, crackers, and bagels.   Bananas.   Applesauce.  Add probiotic-rich foods to help increase healthy bacteria in the GI tract, such as yogurt and fermented milk products. RECOMMENDED FOODS AND BEVERAGES Starches Choose foods with less than 2 g of fiber per serving.  Recommended:  White, French, and pita breads, plain rolls, buns, bagels. Plain muffins, matzo. Soda, saltine, or graham crackers. Pretzels, melba toast, zwieback. Cooked cereals made with water: cornmeal, farina, cream cereals. Dry cereals: refined corn, wheat, rice. Potatoes prepared any way without skins, refined macaroni, spaghetti, noodles, refined rice.  Avoid:  Bread, rolls, or crackers made with whole wheat, multi-grains, rye, bran seeds, nuts, or coconut. Corn tortillas or taco shells. Cereals containing whole grains, multi-grains,  bran, coconut, nuts, raisins. Cooked or dry oatmeal. Coarse wheat cereals, granola. Cereals advertised as "high-fiber." Potato skins. Whole grain pasta, wild or brown rice. Popcorn. Sweet potatoes, yams. Sweet rolls, doughnuts, waffles, pancakes, sweet breads. Vegetables  Recommended: Strained tomato and vegetable juices. Most well-cooked and canned vegetables without seeds. Fresh: Tender lettuce, cucumber without the skin, cabbage, spinach, bean sprouts.  Avoid: Fresh, cooked, or canned: Artichokes, baked beans, beet greens, broccoli, Brussels sprouts, corn, kale, legumes, peas, sweet potatoes. Cooked: Green or red cabbage, spinach. Avoid large servings of any vegetables because vegetables shrink when cooked, and they contain more fiber per serving than fresh vegetables. Fruit  Recommended: Cooked or canned: Apricots, applesauce, cantaloupe, cherries, fruit cocktail, grapefruit, grapes, kiwi, mandarin oranges, peaches, pears, plums, watermelon. Fresh: Apples without skin, ripe banana, grapes, cantaloupe, cherries, grapefruit, peaches, oranges, plums. Keep servings limited to  cup or 1 piece.  Avoid: Fresh: Apples with skin, apricots, mangoes, pears, raspberries, strawberries. Prune juice, stewed or dried prunes. Dried fruits, raisins, dates. Large servings of all fresh fruits. Protein  Recommended: Ground or well-cooked tender beef, ham, veal, lamb, pork, or poultry. Eggs. Fish, oysters, shrimp, lobster, other seafoods. Liver, organ meats.  Avoid: Tough, fibrous meats with gristle. Peanut butter, smooth or chunky. Cheese, nuts, seeds, legumes, dried peas, beans, lentils. Dairy  Recommended: Yogurt, lactose-free milk, kefir, drinkable yogurt, buttermilk, soy milk, or plain hard cheese.    Avoid: Milk, chocolate milk, beverages made with milk, such as milkshakes. Soups  Recommended: Bouillon, broth, or soups made from allowed foods. Any strained soup.  Avoid: Soups made from vegetables that  are not allowed, cream or milk-based soups. Desserts and Sweets  Recommended: Sugar-free gelatin, sugar-free frozen ice pops made without sugar alcohol.  Avoid: Plain cakes and cookies, pie made with fruit, pudding, custard, cream pie. Gelatin, fruit, ice, sherbet, frozen ice pops. Ice cream, ice milk without nuts. Plain hard candy, honey, jelly, molasses, syrup, sugar, chocolate syrup, gumdrops, marshmallows. Fats and Oils  Recommended: Limit fats to less than 8 tsp per day.  Avoid: Seeds, nuts, olives, avocados. Margarine, butter, cream, mayonnaise, salad oils, plain salad dressings. Plain gravy, crisp bacon without rind. Beverages  Recommended: Water, decaffeinated teas, oral rehydration solutions, sugar-free beverages not sweetened with sugar alcohols.  Avoid: Fruit juices, caffeinated beverages (coffee, tea, soda), alcohol, sports drinks, or lemon-lime soda. Condiments  Recommended: Ketchup, mustard, horseradish, vinegar, cocoa powder. Spices in moderation: allspice, basil, bay leaves, celery powder or leaves, cinnamon, cumin powder, curry powder, ginger, mace, marjoram, onion or garlic powder, oregano, paprika, parsley flakes, ground pepper, rosemary, sage, savory, tarragon, thyme, turmeric.  Avoid: Coconut, honey. Document Released: 04/01/2003 Document Revised: 10/04/2011 Document Reviewed: 05/26/2011 [employer] Patient Information 2014 [employer].  

## 2012-07-31 ENCOUNTER — Encounter: Payer: Self-pay | Admitting: *Deleted

## 2012-07-31 ENCOUNTER — Encounter: Payer: BC Managed Care – PPO | Attending: Internal Medicine | Admitting: *Deleted

## 2012-07-31 VITALS — Ht 65.0 in | Wt 158.0 lb

## 2012-07-31 DIAGNOSIS — Z713 Dietary counseling and surveillance: Secondary | ICD-10-CM | POA: Insufficient documentation

## 2012-07-31 DIAGNOSIS — E785 Hyperlipidemia, unspecified: Secondary | ICD-10-CM | POA: Insufficient documentation

## 2012-07-31 DIAGNOSIS — I10 Essential (primary) hypertension: Secondary | ICD-10-CM

## 2012-07-31 NOTE — Progress Notes (Signed)
  Medical Nutrition Therapy:  Appt start time: 0900 end time:  1000.   Assessment:  Primary concerns today: Alyanah would like to learn about general healthy eating for prevention of chronic disease. Patria and her husband have already made some changes.  She retired almost 2 years ago. Chaslyn and husband eat all meals in front on the TV in the living room. Jerrell feels she does not eat too fast, maybe 15/20 minutes depending.   MEDICATIONS: see list    DIETARY INTAKE:  Everyday foods include salads, grilled items from restaurants.  Avoided foods include fried foods (once a month).    24-hr recall:  B ( AM):   - usual: half cup of coffee (FF french vanilla creamer, splenda sometimes), belvita cakes   - Sunday: 2 eggs over easy, 2 slices of whole wheat toast with jelly, 2 slices of bacon  Snk ( AM):  Not usually (maybe one piece of deli meat) L ( PM): deli meat roll up with swiss cheese OR large cup of self serve frozen yogurt (strawberries, wet walnuts) Snk ( PM): corn chips, baked potato chips, saltine crackers, ritz crackers with peanut butter or pimento cheese  D ( PM):   - During the week: depends - meatloaf, salad (brought from somewhere),   - Fridays: grill - (steak,bruats) w/ baked sweet potato or regular potato, sometimes broccoli   - Saturdays: cookout burger, BBQ chicken, salad, normally eats out somewhere   - Sunday: [employer] caf[employer] - sweet potato souffle, fried okra, green beans, sometimes corn bread  Snk ( PM): none  Beverages: Water, Diet Green Tea, cocktails only on Friday    Usual physical activity: 3 times a week and walks for 30 minutes on the treadmill, sometimes walks more with her husband outside   Estimated energy needs: 1600-1800 calories 180-200 g carbohydrates 120-135 g protein 44-50 g fat  Progress Towards Goal(s):  In progress.   Nutritional Diagnosis:  NB-1.1 Food and nutrition-related knowledge deficit As related to high sodium and high fat foods.   As evidenced by dietary recall .    Intervention:   - Slow down and make meals last 20 minutes  - If possible, try not watching TV while eating - Increase body movement - find something you enjoy   - Grill ideas: kebobs with lots of veggies: mushrooms, peppers, tomatoes  - Smart phone app: calorie king  - Reviewed the "plate method"  2 areas for Nelli to focus on include: - Snacks:  Fruit, veggies, protein!! (hard boiled egg, FF string cheese, 1 handful of nuts, yogurt)  - Make our own salads & healthy meals during the week - with our own chicken, stick with FF dressings & vinaigrettes. Follow my plate guidelines for planning meals    Handouts given during visit include:  Yellow meal card with food groups and portion sizes   Monitoring/Evaluation:  Dietary intake, exercise, and body weight in 2 month(s).

## 2012-07-31 NOTE — Patient Instructions (Addendum)
-   Slow down and make meals last 20 minutes  - If possible, try not watching TV while eating - Increase body movement - find something you enjoy  - Snacks:  Fruit, veggies, protein!! (hard boiled egg, FF string cheese, 1 handful of nuts, yogurt)  - Make our own salads & healthy meals during the week - with our own chicken, stick with FF dressings & vinaigrettes   - Grill ideas: kebobs with lots of veggies: mushrooms, peppers, tomatoes  - Smart phone app: calorie king  - Reviewed the "plate method"

## 2012-08-28 ENCOUNTER — Other Ambulatory Visit: Payer: Self-pay

## 2012-09-27 ENCOUNTER — Telehealth: Payer: Self-pay | Admitting: Internal Medicine

## 2012-09-27 NOTE — Telephone Encounter (Signed)
Rec'd from [facility] forward 3 pages to Dr.Norins °

## 2012-10-01 ENCOUNTER — Encounter: Payer: Self-pay | Admitting: Dietician

## 2012-10-01 ENCOUNTER — Encounter: Payer: BC Managed Care – PPO | Attending: Internal Medicine | Admitting: Dietician

## 2012-10-01 VITALS — Ht 65.0 in | Wt 155.3 lb

## 2012-10-01 DIAGNOSIS — Z713 Dietary counseling and surveillance: Secondary | ICD-10-CM | POA: Insufficient documentation

## 2012-10-01 DIAGNOSIS — I10 Essential (primary) hypertension: Secondary | ICD-10-CM | POA: Insufficient documentation

## 2012-10-01 DIAGNOSIS — E785 Hyperlipidemia, unspecified: Secondary | ICD-10-CM | POA: Insufficient documentation

## 2012-10-01 NOTE — Progress Notes (Signed)
  Medical Nutrition Therapy:  Appt start time: 1100 end time:  1130.   Assessment:  Primary concerns today: Vanessa Fuentes is here for a follow up appointment. States she is eating a lot less foods during the day and cutting back on portions. Filling up more than she used to on less foods. Avoiding deli meat and cheese to avoid salt. Going out to dinner about 2-3 x week usually on the weekend. Has lost three pounds and has gone down one size in past two months. Weight loss goal is 145 pounds.   Wt Readings from Last 3 Encounters:  10/01/12 155 lb 4.8 oz (70.444 kg)  07/31/12 158 lb (71.668 kg)  07/15/12 159 lb 8 oz (72.349 kg)   Ht Readings from Last 3 Encounters:  10/01/12 5\' 5"  (1.651 m)  07/31/12 5\' 5"  (1.651 m)  07/15/12 5\' 5"  (1.651 m)   Body mass index is 25.84 kg/(m^2). @BMIFA @ Normalized weight-for-age data available only for age 31 to 20 years. Normalized stature-for-age data available only for age 31 to 20 years.    MEDICATIONS: see list    DIETARY INTAKE:   24-hr recall:  B ( AM):   - usual: cup of coffee (FF french vanilla creamer, splenda sometimes), hard boiled eggs, belvita cakes   - some Sundays: 2 eggs over easy, 2 slices of whole wheat toast with jelly, 2 slices of bacon  Snk ( AM):  Not usually (maybe one piece of chicken breast) L ( PM): chicken breast) or yogurt, belvita OR large cup of self serve frozen yogurt (strawberries, wet walnuts) not as much  Snk ( PM): almonds or sandwich flatbread with cream cheese  D ( PM):   - During the week: depends - going out to eat sometimes, rotisserie chicken with sandwich and fresh tomato and light spreading of mayo   - Fridays: grill - (steak,bruats) w/ baked sweet potato or regular potato, sometimes broccoli   - Saturdays: cookout burger, BBQ chicken, salad, normally eats out somewhere   Snk ( PM): belvita Beverages: Water, Diet Green Tea, cocktails only on Friday    Usual physical activity: sporadic, usually 2-3 times a  week and walks for 30 minutes on the treadmill  Estimated energy needs: 1600-1800 calories 180-200 g carbohydrates 120-135 g protein 44-50 g fat  Progress Towards Goal(s):  In progress.   Nutritional Diagnosis:  NB-1.1 Food and nutrition-related knowledge deficit As related to high sodium and high fat foods.  As evidenced by dietary recall .    Intervention:   - Slow down and make meals last 20 minutes  - If possible, try not watching TV while eating - Increase body movement - find something you enjoy - goal of 30 minutes 5 x week  - Snacks:  Fruit, veggies, protein!! (hard boiled egg, FF string cheese, 1 handful of nuts, yogurt)  - Make our healthy meals during the week - with our own chicken, stick with FF dressings & vinaigrettes   -- Smart phone app: calorie king - consider checking calories and sodium for restaurant meals - Reviewed the "plate method"  Handouts given during visit include:  Yellow meal card with food groups and portion sizes   Monitoring/Evaluation:  Dietary intake, exercise, and body weight in 2 month(s).

## 2012-10-01 NOTE — Patient Instructions (Addendum)
-   Slow down and make meals last 20 minutes  - If possible, try not watching TV while eating - Increase body movement - find something you enjoy - goal of 30 minutes 5 x week  - Snacks:  Fruit, veggies, protein!! (hard boiled egg, FF string cheese, 1 handful of nuts, yogurt)  - Make our healthy meals during the week - with our own chicken, stick with FF dressings & vinaigrettes   -- Smart phone app: calorie king - consider checking calories and sodium for restaurant meals - Reviewed the "plate method"

## 2012-12-02 ENCOUNTER — Encounter: Payer: BC Managed Care – PPO | Attending: Internal Medicine | Admitting: Dietician

## 2012-12-02 VITALS — Ht 65.0 in | Wt 157.7 lb

## 2012-12-02 DIAGNOSIS — I10 Essential (primary) hypertension: Secondary | ICD-10-CM | POA: Insufficient documentation

## 2012-12-02 DIAGNOSIS — E785 Hyperlipidemia, unspecified: Secondary | ICD-10-CM | POA: Insufficient documentation

## 2012-12-02 DIAGNOSIS — Z713 Dietary counseling and surveillance: Secondary | ICD-10-CM | POA: Insufficient documentation

## 2012-12-02 NOTE — Progress Notes (Signed)
  Medical Nutrition Therapy:  Appt start time: 1030 end time:  1100.   Assessment:  Primary concerns today: Vanessa Fuentes is here for a follow up appointment. States that she is doing pretty well, except she has gone to [employer] and had some social activities recently. Eating out about 1 x week.   Wt Readings from Last 3 Encounters:  12/02/12 157 lb 11.2 oz (71.532 kg)  10/01/12 155 lb 4.8 oz (70.444 kg)  07/31/12 158 lb (71.668 kg)   Ht Readings from Last 3 Encounters:  12/02/12 5\' 5"  (1.651 m)  10/01/12 5\' 5"  (1.651 m)  07/31/12 5\' 5"  (1.651 m)   Body mass index is 26.24 kg/(m^2). @BMIFA @ Normalized weight-for-age data available only for age 76 to 20 years. Normalized stature-for-age data available only for age 76 to 20 years.  MEDICATIONS: see list    DIETARY INTAKE:  24-hr recall:  B ( AM):   - usual: cup of coffee (FF french vanilla creamer, splenda sometimes), hard boiled eggs, belvita cakes   - some Sundays: 2 eggs over easy, 2 slices of whole wheat toast with jelly, 2 slices of bacon  Snk ( AM):  Not usually (maybe one piece of low sodium deli meat) L ( PM): chicken breast or yogurt, belvita OR [place] Yogurt Snk ( PM): almonds or sandwich flatbread with cream cheese  D ( PM):   - During the week: depends - going out to eat sometimes, rotisserie chicken with sandwich and fresh tomato and light spreading of mayo   - Fridays: grill - (steak,bruats) w/ baked sweet potato or regular potato, sometimes broccoli   - Saturdays: cookout burger, BBQ chicken, salad, normally eats out somewhere   Snk ( PM): belvita Beverages: Water, Diet Green Tea, cocktails only on Friday    Usual physical activity: sporadic, usually 2-3 times a week and walks for 30 minutes on the treadmill  Estimated energy needs: 1600-1800 calories 180-200 g carbohydrates 120-135 g protein 44-50 g fat  Progress Towards Goal(s):  In progress.   Nutritional Diagnosis:  NB-1.1 Food and nutrition-related  knowledge deficit As related to high sodium and high fat foods.  As evidenced by dietary recall .    Intervention:  Nutrition counseling provided.   Plan: - Consider writing down foods and drinks you are having - Pick your splurges at restaurants - Enjoy your food that you eat and try to not feel guilty if you eat something bad  Monitoring/Evaluation:  Dietary intake, exercise, mindful eating, and body weight in 1 month(s).

## 2012-12-02 NOTE — Patient Instructions (Addendum)
-   Consider writing down foods and drinks you are having - Pick your splurges at restaurants - Enjoy your food that you eat and try to not feel guilty if you eat something bad  -Slow down and make meals last 20 minutes  - If possible, try not watching TV while eating - Increase body movement - find something you enjoy - goal of 30 minutes 5 x week  - Snacks:  Fruit, veggies, protein!! (hard boiled egg, FF string cheese, 1 handful of nuts, yogurt)  - Make our healthy meals during the week - with our own chicken, stick with FF dressings & vinaigrettes   -- Smart phone app: calorie king - consider checking calories and sodium for restaurant meals - Reviewed the "plate method"

## 2012-12-03 ENCOUNTER — Other Ambulatory Visit: Payer: Self-pay | Admitting: Internal Medicine

## 2012-12-30 ENCOUNTER — Ambulatory Visit: Payer: BC Managed Care – PPO | Admitting: Dietician

## 2013-03-18 ENCOUNTER — Encounter: Payer: Self-pay | Admitting: Internal Medicine

## 2013-04-23 ENCOUNTER — Encounter: Payer: Self-pay | Admitting: Internal Medicine

## 2013-04-23 ENCOUNTER — Ambulatory Visit (INDEPENDENT_AMBULATORY_CARE_PROVIDER_SITE_OTHER): Payer: BC Managed Care – PPO | Admitting: Internal Medicine

## 2013-04-23 ENCOUNTER — Other Ambulatory Visit (INDEPENDENT_AMBULATORY_CARE_PROVIDER_SITE_OTHER): Payer: BC Managed Care – PPO

## 2013-04-23 VITALS — BP 138/82 | HR 67 | Temp 98.7°F | Resp 16 | Ht 65.0 in | Wt 162.0 lb

## 2013-04-23 DIAGNOSIS — R7309 Other abnormal glucose: Secondary | ICD-10-CM

## 2013-04-23 DIAGNOSIS — R739 Hyperglycemia, unspecified: Secondary | ICD-10-CM | POA: Insufficient documentation

## 2013-04-23 DIAGNOSIS — Z0000 Encounter for general adult medical examination without abnormal findings: Secondary | ICD-10-CM

## 2013-04-23 DIAGNOSIS — I10 Essential (primary) hypertension: Secondary | ICD-10-CM

## 2013-04-23 DIAGNOSIS — E785 Hyperlipidemia, unspecified: Secondary | ICD-10-CM

## 2013-04-23 DIAGNOSIS — M949 Disorder of cartilage, unspecified: Secondary | ICD-10-CM

## 2013-04-23 DIAGNOSIS — Z23 Encounter for immunization: Secondary | ICD-10-CM

## 2013-04-23 DIAGNOSIS — M899 Disorder of bone, unspecified: Secondary | ICD-10-CM

## 2013-04-23 DIAGNOSIS — M8580 Other specified disorders of bone density and structure, unspecified site: Secondary | ICD-10-CM

## 2013-04-23 LAB — COMPREHENSIVE METABOLIC PANEL
ALT: 18 U/L (ref 0–35)
AST: 24 U/L (ref 0–37)
Albumin: 4.3 g/dL (ref 3.5–5.2)
Alkaline Phosphatase: 68 U/L (ref 39–117)
BILIRUBIN TOTAL: 0.6 mg/dL (ref 0.3–1.2)
BUN: 13 mg/dL (ref 6–23)
CALCIUM: 9.6 mg/dL (ref 8.4–10.5)
CHLORIDE: 101 meq/L (ref 96–112)
CO2: 29 mEq/L (ref 19–32)
CREATININE: 0.8 mg/dL (ref 0.4–1.2)
GFR: 81.86 mL/min (ref >60.00)
Glucose, Bld: 96 mg/dL (ref 70–99)
Potassium: 4.2 mEq/L (ref 3.5–5.1)
Sodium: 137 mEq/L (ref 135–145)
Total Protein: 6.9 g/dL (ref 6.0–8.3)

## 2013-04-23 LAB — TSH: TSH: 0.71 u[IU]/mL (ref 0.35–5.50)

## 2013-04-23 LAB — LIPID PANEL
CHOLESTEROL: 217 mg/dL — AB (ref 0–200)
HDL: 69 mg/dL (ref >39.00)
LDL CALC: 133 mg/dL — AB (ref 0–99)
TRIGLYCERIDES: 76 mg/dL (ref 0.0–149.0)
Total CHOL/HDL Ratio: 3
VLDL: 15.2 mg/dL (ref 0.0–40.0)

## 2013-04-23 LAB — CBC WITH DIFFERENTIAL/PLATELET
BASOS PCT: 0.4 % (ref 0.0–3.0)
Basophils Absolute: 0 10*3/uL (ref 0.0–0.1)
EOS ABS: 0.2 10*3/uL (ref 0.0–0.7)
Eosinophils Relative: 3.2 % (ref 0.0–5.0)
HEMATOCRIT: 43.1 % (ref 36.0–46.0)
HEMOGLOBIN: 14.8 g/dL (ref 12.0–15.0)
LYMPHS ABS: 1.8 10*3/uL (ref 0.7–4.0)
Lymphocytes Relative: 29.3 % (ref 12.0–46.0)
MCHC: 34.3 g/dL (ref 30.0–36.0)
MCV: 90.2 fl (ref 78.0–100.0)
MONO ABS: 0.4 10*3/uL (ref 0.1–1.0)
Monocytes Relative: 5.9 % (ref 3.0–12.0)
NEUTROS ABS: 3.7 10*3/uL (ref 1.4–7.7)
Neutrophils Relative %: 61.2 % (ref 43.0–77.0)
Platelets: 187 10*3/uL (ref 150.0–400.0)
RBC: 4.78 Mil/uL (ref 3.87–5.11)
RDW: 12.9 % (ref 11.5–14.6)
WBC: 6.1 10*3/uL (ref 4.5–10.5)

## 2013-04-23 LAB — URINALYSIS, ROUTINE W REFLEX MICROSCOPIC
Bilirubin Urine: NEGATIVE
HGB URINE DIPSTICK: NEGATIVE
Ketones, ur: NEGATIVE
LEUKOCYTES UA: NEGATIVE
NITRITE: NEGATIVE
Specific Gravity, Urine: 1.01 (ref 1.000–1.030)
TOTAL PROTEIN, URINE-UPE24: NEGATIVE
URINE GLUCOSE: NEGATIVE
Urobilinogen, UA: 0.2 (ref 0.0–1.0)
pH: 6 (ref 5.0–8.0)

## 2013-04-23 LAB — HEMOGLOBIN A1C: Hgb A1c MFr Bld: 5.1 % (ref 4.6–6.5)

## 2013-04-23 NOTE — Patient Instructions (Signed)
Hypertension As your heart beats, it forces blood through your arteries. This force is your blood pressure. If the pressure is too high, it is called hypertension (HTN) or high blood pressure. HTN is dangerous because you may have it and not know it. High blood pressure may mean that your heart has to work harder to pump blood. Your arteries may be narrow or stiff. The extra work puts you at risk for heart disease, stroke, and other problems.  Blood pressure consists of two numbers, a higher number over a lower, 110/72, for example. It is stated as "110 over 72." The ideal is below 120 for the top number (systolic) and under 80 for the bottom (diastolic). Write down your blood pressure today. You should pay close attention to your blood pressure if you have certain conditions such as:  Heart failure.  Prior heart attack.  Diabetes  Chronic kidney disease.  Prior stroke.  Multiple risk factors for heart disease. To see if you have HTN, your blood pressure should be measured while you are seated with your arm held at the level of the heart. It should be measured at least twice. A one-time elevated blood pressure reading (especially in the Emergency Department) does not mean that you need treatment. There may be conditions in which the blood pressure is different between your right and left arms. It is important to see your caregiver soon for a recheck. Most people have essential hypertension which means that there is not a specific cause. This type of high blood pressure may be lowered by changing lifestyle factors such as:  Stress.  Smoking.  Lack of exercise.  Excessive weight.  Drug/tobacco/alcohol use.  Eating less salt. Most people do not have symptoms from high blood pressure until it has caused damage to the body. Effective treatment can often prevent, delay or reduce that damage. TREATMENT  When a cause has been identified, treatment for high blood pressure is directed at the  cause. There are a large number of medications to treat HTN. These fall into several categories, and your caregiver will help you select the medicines that are best for you. Medications may have side effects. You should review side effects with your caregiver. If your blood pressure stays high after you have made lifestyle changes or started on medicines,   Your medication(s) may need to be changed.  Other problems may need to be addressed.  Be certain you understand your prescriptions, and know how and when to take your medicine.  Be sure to follow up with your caregiver within the time frame advised (usually within two weeks) to have your blood pressure rechecked and to review your medications.  If you are taking more than one medicine to lower your blood pressure, make sure you know how and at what times they should be taken. Taking two medicines at the same time can result in blood pressure that is too low. SEEK IMMEDIATE MEDICAL CARE IF:  You develop a severe headache, blurred or changing vision, or confusion.  You have unusual weakness or numbness, or a faint feeling.  You have severe chest or abdominal pain, vomiting, or breathing problems. MAKE SURE YOU:   Understand these instructions.  Will watch your condition.  Will get help right away if you are not doing well or get worse. Document Released: 01/09/2005 Document Revised: 04/03/2011 Document Reviewed: 08/30/2007 [facility] Patient Information 2014 [place]. Preventive Care for Adults, Female A healthy lifestyle and preventive care can promote health and wellness.  Preventive health guidelines for women include the following key practices.  A routine yearly physical is a good way to check with your health care provider about your health and preventive screening. It is a chance to share any concerns and updates on your health and to receive a thorough exam.  Visit your dentist for a routine exam and preventive care  every 6 months. Brush your teeth twice a day and floss once a day. Good oral hygiene prevents tooth decay and gum disease.  The frequency of eye exams is based on your age, health, family medical history, use of contact lenses, and other factors. Follow your health care provider's recommendations for frequency of eye exams.  Eat a healthy diet. Foods like vegetables, fruits, whole grains, low-fat dairy products, and lean protein foods contain the nutrients you need without too many calories. Decrease your intake of foods high in solid fats, added sugars, and salt. Eat the right amount of calories for you.Get information about a proper diet from your health care provider, if necessary.  Regular physical exercise is one of the most important things you can do for your health. Most adults should get at least 150 minutes of moderate-intensity exercise (any activity that increases your heart rate and causes you to sweat) each week. In addition, most adults need muscle-strengthening exercises on 2 or more days a week.  Maintain a healthy weight. The body mass index (BMI) is a screening tool to identify possible weight problems. It provides an estimate of body fat based on height and weight. Your health care provider can find your BMI, and can help you achieve or maintain a healthy weight.For adults 20 years and older:  A BMI below 18.5 is considered underweight.  A BMI of 18.5 to 24.9 is normal.  A BMI of 25 to 29.9 is considered overweight.  A BMI of 30 and above is considered obese.  Maintain normal blood lipids and cholesterol levels by exercising and minimizing your intake of saturated fat. Eat a balanced diet with plenty of fruit and vegetables. Blood tests for lipids and cholesterol should begin at age 55 and be repeated every 5 years. If your lipid or cholesterol levels are high, you are over 50, or you are at high risk for heart disease, you may need your cholesterol levels checked more  frequently.Ongoing high lipid and cholesterol levels should be treated with medicines if diet and exercise are not working.  If you smoke, find out from your health care provider how to quit. If you do not use tobacco, do not start.  Lung cancer screening is recommended for adults aged 37 80 years who are at high risk for developing lung cancer because of a history of smoking. A yearly low-dose CT scan of the lungs is recommended for people who have at least a 30-pack-year history of smoking and are a current smoker or have quit within the past 15 years. A pack year of smoking is smoking an average of 1 pack of cigarettes a day for 1 year (for example: 1 pack a day for 30 years or 2 packs a day for 15 years). Yearly screening should continue until the smoker has stopped smoking for at least 15 years. Yearly screening should be stopped for people who develop a health problem that would prevent them from having lung cancer treatment.  If you are pregnant, do not drink alcohol. If you are breastfeeding, be very cautious about drinking alcohol. If you are not pregnant and choose  to drink alcohol, do not have more than 1 drink per day. One drink is considered to be 12 ounces (355 mL) of beer, 5 ounces (148 mL) of wine, or 1.5 ounces (44 mL) of liquor.  Avoid use of street drugs. Do not share needles with anyone. Ask for help if you need support or instructions about stopping the use of drugs.  High blood pressure causes heart disease and increases the risk of stroke. Your blood pressure should be checked at least every 1 to 2 years. Ongoing high blood pressure should be treated with medicines if weight loss and exercise do not work.  If you are 74 68 years old, ask your health care provider if you should take aspirin to prevent strokes.  Diabetes screening involves taking a blood sample to check your fasting blood sugar level. This should be done once every 3 years, after age 56, if you are within normal  weight and without risk factors for diabetes. Testing should be considered at a younger age or be carried out more frequently if you are overweight and have at least 1 risk factor for diabetes.  Breast cancer screening is essential preventive care for women. You should practice "breast self-awareness." This means understanding the normal appearance and feel of your breasts and may include breast self-examination. Any changes detected, no matter how small, should be reported to a health care provider. Women in their 81s and 30s should have a clinical breast exam (CBE) by a health care provider as part of a regular health exam every 1 to 3 years. After age 41, women should have a CBE every year. Starting at age 32, women should consider having a mammogram (breast X-ray test) every year. Women who have a family history of breast cancer should talk to their health care provider about genetic screening. Women at a high risk of breast cancer should talk to their health care providers about having an MRI and a mammogram every year.  Breast cancer gene (BRCA)-related cancer risk assessment is recommended for women who have family members with BRCA-related cancers. BRCA-related cancers include breast, ovarian, tubal, and peritoneal cancers. Having family members with these cancers may be associated with an increased risk for harmful changes (mutations) in the breast cancer genes BRCA1 and BRCA2. Results of the assessment will determine the need for genetic counseling and BRCA1 and BRCA2 testing.  The Pap test is a screening test for cervical cancer. A Pap test can show cell changes on the cervix that might become cervical cancer if left untreated. A Pap test is a procedure in which cells are obtained and examined from the lower end of the uterus (cervix).  Women should have a Pap test starting at age 52.  Between ages 46 and 46, Pap tests should be repeated every 2 years.  Beginning at age 26, you should have a  Pap test every 3 years as long as the past 3 Pap tests have been normal.  Some women have medical problems that increase the chance of getting cervical cancer. Talk to your health care provider about these problems. It is especially important to talk to your health care provider if a new problem develops soon after your last Pap test. In these cases, your health care provider may recommend more frequent screening and Pap tests.  The above recommendations are the same for women who have or have not gotten the vaccine for human papillomavirus (HPV).  If you had a hysterectomy for a problem that was  not cancer or a condition that could lead to cancer, then you no longer need Pap tests. Even if you no longer need a Pap test, a regular exam is a good idea to make sure no other problems are starting.  If you are between ages 86 and 40 years, and you have had normal Pap tests going back 10 years, you no longer need Pap tests. Even if you no longer need a Pap test, a regular exam is a good idea to make sure no other problems are starting.  If you have had past treatment for cervical cancer or a condition that could lead to cancer, you need Pap tests and screening for cancer for at least 20 years after your treatment.  If Pap tests have been discontinued, risk factors (such as a new sexual partner) need to be reassessed to determine if screening should be resumed.  The HPV test is an additional test that may be used for cervical cancer screening. The HPV test looks for the virus that can cause the cell changes on the cervix. The cells collected during the Pap test can be tested for HPV. The HPV test could be used to screen women aged 39 years and older, and should be used in women of any age who have unclear Pap test results. After the age of 12, women should have HPV testing at the same frequency as a Pap test.  Colorectal cancer can be detected and often prevented. Most routine colorectal cancer screening  begins at the age of 32 years and continues through age 26 years. However, your health care provider may recommend screening at an earlier age if you have risk factors for colon cancer. On a yearly basis, your health care provider may provide home test kits to check for hidden blood in the stool. Use of a small camera at the end of a tube, to directly examine the colon (sigmoidoscopy or colonoscopy), can detect the earliest forms of colorectal cancer. Talk to your health care provider about this at age 47, when routine screening begins. Direct exam of the colon should be repeated every 5 10 years through age 95 years, unless early forms of pre-cancerous polyps or small growths are found.  People who are at an increased risk for hepatitis B should be screened for this virus. You are considered at high risk for hepatitis B if:  You were born in a country where hepatitis B occurs often. Talk with your health care provider about which countries are considered high risk.  Your parents were born in a high-risk country and you have not received a shot to protect against hepatitis B (hepatitis B vaccine).  You have HIV or AIDS.  You use needles to inject street drugs.  You live with, or have sex with, someone who has Hepatitis B.  You get hemodialysis treatment.  You take certain medicines for conditions like cancer, organ transplantation, and autoimmune conditions.  Hepatitis C blood testing is recommended for all people born from [birth year] through 1965 and any individual with known risks for hepatitis C.  Practice safe sex. Use condoms and avoid high-risk sexual practices to reduce the spread of sexually transmitted infections (STIs). STIs include gonorrhea, chlamydia, syphilis, trichomonas, herpes, HPV, and human immunodeficiency virus (HIV). Herpes, HIV, and HPV are viral illnesses that have no cure. They can result in disability, cancer, and death. Sexually active women aged 67 years and younger should  be checked for chlamydia. Older women with new or multiple partners  should also be tested for chlamydia. Testing for other STIs is recommended if you are sexually active and at increased risk.  Osteoporosis is a disease in which the bones lose minerals and strength with aging. This can result in serious bone fractures or breaks. The risk of osteoporosis can be identified using a bone density scan. Women ages 65 years and over and women at risk for fractures or osteoporosis should discuss screening with their health care providers. Ask your health care provider whether you should take a calcium supplement or vitamin D to reduce the rate of osteoporosis.  Menopause can be associated with physical symptoms and risks. Hormone replacement therapy is available to decrease symptoms and risks. You should talk to your health care provider about whether hormone replacement therapy is right for you.  Use sunscreen. Apply sunscreen liberally and repeatedly throughout the day. You should seek shade when your shadow is shorter than you. Protect yourself by wearing long sleeves, pants, a wide-brimmed hat, and sunglasses year round, whenever you are outdoors.  Once a month, do a whole body skin exam, using a mirror to look at the skin on your back. Tell your health care provider of new moles, moles that have irregular borders, moles that are larger than a pencil eraser, or moles that have changed in shape or color.  Stay current with required vaccines (immunizations).  Influenza vaccine. All adults should be immunized every year.  Tetanus, diphtheria, and acellular pertussis (Td, Tdap) vaccine. Pregnant women should receive 1 dose of Tdap vaccine during each pregnancy. The dose should be obtained regardless of the length of time since the last dose. Immunization is preferred during the 27th 36th week of gestation. An adult who has not previously received Tdap or who does not know her vaccine status should receive 1  dose of Tdap. This initial dose should be followed by tetanus and diphtheria toxoids (Td) booster doses every 10 years. Adults with an unknown or incomplete history of completing a 3-dose immunization series with Td-containing vaccines should begin or complete a primary immunization series including a Tdap dose. Adults should receive a Td booster every 10 years.  Varicella vaccine. An adult without evidence of immunity to varicella should receive 2 doses or a second dose if she has previously received 1 dose. Pregnant females who do not have evidence of immunity should receive the first dose after pregnancy. This first dose should be obtained before leaving the health care facility. The second dose should be obtained 4 8 weeks after the first dose.  Human papillomavirus (HPV) vaccine. Females aged 15 26 years who have not received the vaccine previously should obtain the 3-dose series. The vaccine is not recommended for use in pregnant females. However, pregnancy testing is not needed before receiving a dose. If a female is found to be pregnant after receiving a dose, no treatment is needed. In that case, the remaining doses should be delayed until after the pregnancy. Immunization is recommended for any person with an immunocompromised condition through the age of 79 years if she did not get any or all doses earlier. During the 3-dose series, the second dose should be obtained 4 8 weeks after the first dose. The third dose should be obtained 24 weeks after the first dose and 16 weeks after the second dose.  Zoster vaccine. One dose is recommended for adults aged 70 years or older unless certain conditions are present.  Measles, mumps, and rubella (MMR) vaccine. Adults born before 25 generally are  considered immune to measles and mumps. Adults born in [birth year] or later should have 1 or more doses of MMR vaccine unless there is a contraindication to the vaccine or there is laboratory evidence of immunity to  each of the three diseases. A routine second dose of MMR vaccine should be obtained at least 28 days after the first dose for students attending postsecondary schools, health care workers, or international travelers. People who received inactivated measles vaccine or an unknown type of measles vaccine during 1963 1967 should receive 2 doses of MMR vaccine. People who received inactivated mumps vaccine or an unknown type of mumps vaccine before 1979 and are at high risk for mumps infection should consider immunization with 2 doses of MMR vaccine. For females of childbearing age, rubella immunity should be determined. If there is no evidence of immunity, females who are not pregnant should be vaccinated. If there is no evidence of immunity, females who are pregnant should delay immunization until after pregnancy. Unvaccinated health care workers born before [birth year] who lack laboratory evidence of measles, mumps, or rubella immunity or laboratory confirmation of disease should consider measles and mumps immunization with 2 doses of MMR vaccine or rubella immunization with 1 dose of MMR vaccine.  Pneumococcal 13-valent conjugate (PCV13) vaccine. When indicated, a person who is uncertain of her immunization history and has no record of immunization should receive the PCV13 vaccine. An adult aged 58 years or older who has certain medical conditions and has not been previously immunized should receive 1 dose of PCV13 vaccine. This PCV13 should be followed with a dose of pneumococcal polysaccharide (PPSV23) vaccine. The PPSV23 vaccine dose should be obtained at least 8 weeks after the dose of PCV13 vaccine. An adult aged 4 years or older who has certain medical conditions and previously received 1 or more doses of PPSV23 vaccine should receive 1 dose of PCV13. The PCV13 vaccine dose should be obtained 1 or more years after the last PPSV23 vaccine dose.  Pneumococcal polysaccharide (PPSV23) vaccine. When PCV13 is also  indicated, PCV13 should be obtained first. All adults aged 32 years and older should be immunized. An adult younger than age 1 years who has certain medical conditions should be immunized. Any person who resides in a nursing home or long-term care facility should be immunized. An adult smoker should be immunized. People with an immunocompromised condition and certain other conditions should receive both PCV13 and PPSV23 vaccines. People with human immunodeficiency virus (HIV) infection should be immunized as soon as possible after diagnosis. Immunization during chemotherapy or radiation therapy should be avoided. Routine use of PPSV23 vaccine is not recommended for American Indians, [place] Natives, or people younger than 65 years unless there are medical conditions that require PPSV23 vaccine. When indicated, people who have unknown immunization and have no record of immunization should receive PPSV23 vaccine. One-time revaccination 5 years after the first dose of PPSV23 is recommended for people aged 85 64 years who have chronic kidney failure, nephrotic syndrome, asplenia, or immunocompromised conditions. People who received 1 2 doses of PPSV23 before age 25 years should receive another dose of PPSV23 vaccine at age 84 years or later if at least 5 years have passed since the previous dose. Doses of PPSV23 are not needed for people immunized with PPSV23 at or after age 61 years.  Meningococcal vaccine. Adults with asplenia or persistent complement component deficiencies should receive 2 doses of quadrivalent meningococcal conjugate (MenACWY-D) vaccine. The doses should be obtained at least 2 months  apart. Microbiologists working with certain meningococcal bacteria, [employer] recruits, people at risk during an outbreak, and people who travel to or live in countries with a high rate of meningitis should be immunized. A first-year college student up through age 29 years who is living in a residence hall should  receive a dose if she did not receive a dose on or after her 16th birthday. Adults who have certain high-risk conditions should receive one or more doses of vaccine.  Hepatitis A vaccine. Adults who wish to be protected from this disease, have certain high-risk conditions, work with hepatitis A-infected animals, work in hepatitis A research labs, or travel to or work in countries with a high rate of hepatitis A should be immunized. Adults who were previously unvaccinated and who anticipate close contact with an international adoptee during the first 60 days after arrival in the [place] from a country with a high rate of hepatitis A should be immunized.  Hepatitis B vaccine. Adults who wish to be protected from this disease, have certain high-risk conditions, may be exposed to blood or other infectious body fluids, are household contacts or sex partners of hepatitis B positive people, are clients or workers in certain care facilities, or travel to or work in countries with a high rate of hepatitis B should be immunized.  Haemophilus influenzae type b (Hib) vaccine. A previously unvaccinated person with asplenia or sickle cell disease or having a scheduled splenectomy should receive 1 dose of Hib vaccine. Regardless of previous immunization, a recipient of a hematopoietic stem cell transplant should receive a 3-dose series 6 12 months after her successful transplant. Hib vaccine is not recommended for adults with HIV infection. Preventive Services / Frequency Ages 98 to 39years  Blood pressure check.** / Every 1 to 2 years.  Lipid and cholesterol check.** / Every 5 years beginning at age 41.  Clinical breast exam.** / Every 3 years for women in their 39s and 3s.  BRCA-related cancer risk assessment.** / For women who have family members with a BRCA-related cancer (breast, ovarian, tubal, or peritoneal cancers).  Pap test.** / Every 2 years from ages 31 through 17. Every 3 years starting at age  72 through age 8 or 49 with a history of 3 consecutive normal Pap tests.  HPV screening.** / Every 3 years from ages 51 through ages 40 to 32 with a history of 3 consecutive normal Pap tests.  Hepatitis C blood test.** / For any individual with known risks for hepatitis C.  Skin self-exam. / Monthly.  Influenza vaccine. / Every year.  Tetanus, diphtheria, and acellular pertussis (Tdap, Td) vaccine.** / Consult your health care provider. Pregnant women should receive 1 dose of Tdap vaccine during each pregnancy. 1 dose of Td every 10 years.  Varicella vaccine.** / Consult your health care provider. Pregnant females who do not have evidence of immunity should receive the first dose after pregnancy.  HPV vaccine. / 3 doses over 6 months, if 43 and younger. The vaccine is not recommended for use in pregnant females. However, pregnancy testing is not needed before receiving a dose.  Measles, mumps, rubella (MMR) vaccine.** / You need at least 1 dose of MMR if you were born in 1957 or later. You may also need a 2nd dose. For females of childbearing age, rubella immunity should be determined. If there is no evidence of immunity, females who are not pregnant should be vaccinated. If there is no evidence of immunity, females who are pregnant  should delay immunization until after pregnancy.  Pneumococcal 13-valent conjugate (PCV13) vaccine.** / Consult your health care provider.  Pneumococcal polysaccharide (PPSV23) vaccine.** / 1 to 2 doses if you smoke cigarettes or if you have certain conditions.  Meningococcal vaccine.** / 1 dose if you are age 74 to 79 years and a Market researcher living in a residence hall, or have one of several medical conditions, you need to get vaccinated against meningococcal disease. You may also need additional booster doses.  Hepatitis A vaccine.** / Consult your health care provider.  Hepatitis B vaccine.** / Consult your health care provider.  Haemophilus  influenzae type b (Hib) vaccine.** / Consult your health care provider. Ages 32 to 64years  Blood pressure check.** / Every 1 to 2 years.  Lipid and cholesterol check.** / Every 5 years beginning at age 64 years.  Lung cancer screening. / Every year if you are aged 94 80 years and have a 30-pack-year history of smoking and currently smoke or have quit within the past 15 years. Yearly screening is stopped once you have quit smoking for at least 15 years or develop a health problem that would prevent you from having lung cancer treatment.  Clinical breast exam.** / Every year after age 79 years.  BRCA-related cancer risk assessment.** / For women who have family members with a BRCA-related cancer (breast, ovarian, tubal, or peritoneal cancers).  Mammogram.** / Every year beginning at age 42 years and continuing for as long as you are in good health. Consult with your health care provider.  Pap test.** / Every 3 years starting at age 30 years through age 64 or 23 years with a history of 3 consecutive normal Pap tests.  HPV screening.** / Every 3 years from ages 2 years through ages 64 to 61 years with a history of 3 consecutive normal Pap tests.  Fecal occult blood test (FOBT) of stool. / Every year beginning at age 54 years and continuing until age 66 years. You may not need to do this test if you get a colonoscopy every 10 years.  Flexible sigmoidoscopy or colonoscopy.** / Every 5 years for a flexible sigmoidoscopy or every 10 years for a colonoscopy beginning at age 59 years and continuing until age 32 years.  Hepatitis C blood test.** / For all people born from [birth year] through 1965 and any individual with known risks for hepatitis C.  Skin self-exam. / Monthly.  Influenza vaccine. / Every year.  Tetanus, diphtheria, and acellular pertussis (Tdap/Td) vaccine.** / Consult your health care provider. Pregnant women should receive 1 dose of Tdap vaccine during each pregnancy. 1 dose of Td  every 10 years.  Varicella vaccine.** / Consult your health care provider. Pregnant females who do not have evidence of immunity should receive the first dose after pregnancy.  Zoster vaccine.** / 1 dose for adults aged 59 years or older.  Measles, mumps, rubella (MMR) vaccine.** / You need at least 1 dose of MMR if you were born in 1957 or later. You may also need a 2nd dose. For females of childbearing age, rubella immunity should be determined. If there is no evidence of immunity, females who are not pregnant should be vaccinated. If there is no evidence of immunity, females who are pregnant should delay immunization until after pregnancy.  Pneumococcal 13-valent conjugate (PCV13) vaccine.** / Consult your health care provider.  Pneumococcal polysaccharide (PPSV23) vaccine.** / 1 to 2 doses if you smoke cigarettes or if you have certain conditions.  Meningococcal  vaccine.** / Consult your health care provider.  Hepatitis A vaccine.** / Consult your health care provider.  Hepatitis B vaccine.** / Consult your health care provider.  Haemophilus influenzae type b (Hib) vaccine.** / Consult your health care provider. Ages 83 years and over  Blood pressure check.** / Every 1 to 2 years.  Lipid and cholesterol check.** / Every 5 years beginning at age 49 years.  Lung cancer screening. / Every year if you are aged 34 80 years and have a 30-pack-year history of smoking and currently smoke or have quit within the past 15 years. Yearly screening is stopped once you have quit smoking for at least 15 years or develop a health problem that would prevent you from having lung cancer treatment.  Clinical breast exam.** / Every year after age 38 years.  BRCA-related cancer risk assessment.** / For women who have family members with a BRCA-related cancer (breast, ovarian, tubal, or peritoneal cancers).  Mammogram.** / Every year beginning at age 46 years and continuing for as long as you are in good  health. Consult with your health care provider.  Pap test.** / Every 3 years starting at age 74 years through age 34 or 64 years with 3 consecutive normal Pap tests. Testing can be stopped between 65 and 70 years with 3 consecutive normal Pap tests and no abnormal Pap or HPV tests in the past 10 years.  HPV screening.** / Every 3 years from ages 66 years through ages 60 or 41 years with a history of 3 consecutive normal Pap tests. Testing can be stopped between 65 and 70 years with 3 consecutive normal Pap tests and no abnormal Pap or HPV tests in the past 10 years.  Fecal occult blood test (FOBT) of stool. / Every year beginning at age 40 years and continuing until age 76 years. You may not need to do this test if you get a colonoscopy every 10 years.  Flexible sigmoidoscopy or colonoscopy.** / Every 5 years for a flexible sigmoidoscopy or every 10 years for a colonoscopy beginning at age 50 years and continuing until age 64 years.  Hepatitis C blood test.** / For all people born from [birth year] through 1965 and any individual with known risks for hepatitis C.  Osteoporosis screening.** / A one-time screening for women ages 50 years and over and women at risk for fractures or osteoporosis.  Skin self-exam. / Monthly.  Influenza vaccine. / Every year.  Tetanus, diphtheria, and acellular pertussis (Tdap/Td) vaccine.** / 1 dose of Td every 10 years.  Varicella vaccine.** / Consult your health care provider.  Zoster vaccine.** / 1 dose for adults aged 29 years or older.  Pneumococcal 13-valent conjugate (PCV13) vaccine.** / Consult your health care provider.  Pneumococcal polysaccharide (PPSV23) vaccine.** / 1 dose for all adults aged 55 years and older.  Meningococcal vaccine.** / Consult your health care provider.  Hepatitis A vaccine.** / Consult your health care provider.  Hepatitis B vaccine.** / Consult your health care provider.  Haemophilus influenzae type b (Hib) vaccine.** /  Consult your health care provider. ** Family history and personal history of risk and conditions may change your health care provider's recommendations. Document Released: 03/07/2001 Document Revised: 10/30/2012 Document Reviewed: 06/06/2010 [facility] Patient Information 2014 [place], [place].

## 2013-04-23 NOTE — Progress Notes (Signed)
   Subjective:    Patient ID: Vanessa Fuentes, female    DOB: 25-Aug-1945, 68 y.o.   MRN: [id number]  Hypertension This is a chronic problem. The current episode started more than 1 year ago. The problem has been gradually improving since onset. The problem is controlled. Pertinent negatives include no anxiety, blurred vision, chest pain, headaches, malaise/fatigue, neck pain, orthopnea, palpitations, peripheral edema, PND, shortness of breath or sweats. There are no associated agents to hypertension. Past treatments include diuretics. The current treatment provides significant improvement. There are no compliance problems.       Review of Systems  Constitutional: Negative.  Negative for fever, chills, malaise/fatigue, diaphoresis, appetite change and fatigue.  HENT: Negative.   Eyes: Negative.  Negative for blurred vision.  Respiratory: Negative.  Negative for cough, choking, chest tightness, shortness of breath and stridor.   Cardiovascular: Negative.  Negative for chest pain, palpitations, orthopnea, leg swelling and PND.  Gastrointestinal: Negative.  Negative for nausea, vomiting, abdominal pain, diarrhea, constipation and blood in stool.  Endocrine: Negative.   Genitourinary: Negative.   Musculoskeletal: Negative.  Negative for arthralgias, back pain, gait problem, neck pain and neck stiffness.  Skin: Negative.   Allergic/Immunologic: Negative.   Neurological: Negative.  Negative for dizziness, tremors, speech difficulty, weakness, light-headedness, numbness and headaches.  Hematological: Negative.  Negative for adenopathy. Does not bruise/bleed easily.  Psychiatric/Behavioral: Negative.        Objective:   Physical Exam  Vitals reviewed. Constitutional: She is oriented to person, place, and time. She appears well-developed and well-nourished. No distress.  HENT:  Head: Normocephalic and atraumatic.  Mouth/Throat: Oropharynx is clear and moist. No oropharyngeal exudate.    Eyes: Conjunctivae are normal. Right eye exhibits no discharge. Left eye exhibits no discharge. No scleral icterus.  Neck: Normal range of motion. Neck supple. No JVD present. No tracheal deviation present. No thyromegaly present.  Cardiovascular: Normal rate, regular rhythm, normal heart sounds and intact distal pulses.  Exam reveals no gallop and no friction rub.   No murmur heard. Pulmonary/Chest: Effort normal and breath sounds normal. No stridor. No respiratory distress. She has no wheezes. She has no rales. She exhibits no tenderness.  Abdominal: Soft. Bowel sounds are normal. She exhibits no distension and no mass. There is no tenderness. There is no rebound and no guarding.  Musculoskeletal: Normal range of motion. She exhibits no edema and no tenderness.  Lymphadenopathy:    She has no cervical adenopathy.  Neurological: She is oriented to person, place, and time.  Skin: Skin is warm and dry. No rash noted. She is not diaphoretic. No erythema. No pallor.  Psychiatric: She has a normal mood and affect. Her behavior is normal. Judgment and thought content normal.     Lab Results  Component Value Date   WBC 6.8 07/15/2012   HGB 14.4 07/15/2012   HCT 42.3 07/15/2012   PLT 242.0 07/15/2012   GLUCOSE 102* 07/15/2012   CHOL 232* 04/13/2011   TRIG 154.0* 04/13/2011   HDL 69.20 04/13/2011   LDLDIRECT 148.3 04/13/2011   ALT 53* 07/15/2012   AST 34 07/15/2012   NA 138 07/15/2012   K 4.4 07/15/2012   CL 100 07/15/2012   CREATININE 0.8 07/15/2012   BUN 12 07/15/2012   CO2 30 07/15/2012   TSH 1.00 04/11/2006       Assessment & Plan:

## 2013-04-23 NOTE — Progress Notes (Signed)
Pre visit review using our clinic review tool, if applicable. No additional management support is needed unless otherwise documented below in the visit note. 

## 2013-04-24 DIAGNOSIS — M810 Age-related osteoporosis without current pathological fracture: Secondary | ICD-10-CM | POA: Insufficient documentation

## 2013-04-24 NOTE — Assessment & Plan Note (Signed)
Her blood sugars are normal now

## 2013-04-24 NOTE — Assessment & Plan Note (Signed)
Her BP is well controlled Her lytes and renal function are stable 

## 2013-04-24 NOTE — Assessment & Plan Note (Signed)
She is due for a DEXA scan 

## 2013-04-24 NOTE — Assessment & Plan Note (Addendum)

## 2013-04-24 NOTE — Assessment & Plan Note (Signed)
She has reached her LDL goal of about 130

## 2013-05-24 ENCOUNTER — Ambulatory Visit (INDEPENDENT_AMBULATORY_CARE_PROVIDER_SITE_OTHER): Payer: BC Managed Care – PPO | Admitting: Family Medicine

## 2013-05-24 ENCOUNTER — Encounter: Payer: Self-pay | Admitting: Family Medicine

## 2013-05-24 VITALS — BP 134/80 | Temp 97.5°F | Wt 163.0 lb

## 2013-05-24 DIAGNOSIS — H6990 Unspecified Eustachian tube disorder, unspecified ear: Secondary | ICD-10-CM

## 2013-05-24 DIAGNOSIS — H6590 Unspecified nonsuppurative otitis media, unspecified ear: Secondary | ICD-10-CM

## 2013-05-24 MED ORDER — FLUTICASONE PROPIONATE 50 MCG/ACT NA SUSP: 2.0000 | Freq: Every day | NASAL | Status: DC

## 2013-05-24 NOTE — Patient Instructions (Signed)
-  AFRIN nasal spray per instructions for 4 days then stop  -flonase for 21 days 2 sprays each nostril  -As we discussed, we have prescribed a new medication for you at this appointment. We discussed the common and serious potential adverse effects of this medication and you can review these and more with the pharmacist when you pick up your medication.  Please follow the instructions for use carefully and notify us immediately if you have any problems taking this medication.

## 2013-05-24 NOTE — Progress Notes (Signed)
Chief Complaint  Patient presents with  . Otalgia    right; started last night     HPI:  -started: last night -symptoms: R ear fullness and a little pain -denies:fever, SOB, NVD, tooth pain -reports hx of ear infection and deviated septum - had tx for this, reports PCP has told her to take sudafed when this happens as she has "eustachian tube disorder" ROS: See pertinent positives and negatives per HPI.  Past Medical History  Diagnosis Date  . Spinal stenosis in cervical region   . Cervicalgia   . Brachial neuritis or radiculitis NOS   . Unspecified essential hypertension   . Degeneration of lumbar or lumbosacral intervertebral disc   . Degeneration of cervical intervertebral disc   . Dysfunction of eustachian tube     Past Surgical History  Procedure Laterality Date  . Tubal ligation    . Cervical discectomy  07/2009    C3-4 with fusion- Dr Janine Ores  . Nasal septum surgery  01/2009    [facility]  . Colonoscopy      polypectomy  . Spine surgery      Family History  Problem Relation Age of Onset  . Hypertension Mother   . Heart disease Mother     CAD MI CABG  . Cancer Mother   . Heart disease Father     CAD MI  . Hypertension Father   . Heart disease Brother     CAD  . Cancer Maternal Aunt     Breast  . Diabetes Neg Hx   . Colon cancer Neg Hx   . Stomach cancer Neg Hx   . COPD Neg Hx     History   Social History  . Marital Status: Married    Spouse Name: N/A    Number of Children: N/A  . Years of Education: N/A   Social History Main Topics  . Smoking status: Former Smoker    Quit date: 02/16/1981  . Smokeless tobacco: Never Used     Comment: During Adolescence  . Alcohol Use: 1.0 oz/week    2 drink(s) per week  . Drug Use: No  . Sexual Activity: Yes    Partners: Male   Other Topics Concern  . 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 Married in 1969, one daughter-'73 and 2 grandchildren.  Work- retired from  [employer] October '12  after 43 years. Life is good.     Current outpatient prescriptions:furosemide (LASIX) 20 MG tablet, take 1 tablet by mouth once daily, Disp: 30 tablet, Rfl: 6;  fluticasone (FLONASE) 50 MCG/ACT nasal spray, Place 2 sprays into both nostrils daily., Disp: 16 g, Rfl: 0  EXAM:  Filed Vitals:   05/24/13 1026  BP: 134/80  Temp: 97.5 F (36.4 C)    Body mass index is 27.12 kg/(m^2).  GENERAL: vitals reviewed and listed above, alert, oriented, appears well hydrated and in no acute distress  HEENT: atraumatic, conjunttiva clear, no obvious abnormalities on inspection of external nose and ears, normal appearance of ear canals and TMs except bilat fuid bubbles - no dulling or redness of TM, no bulging of TMs, clear nasal congestion, mild post oropharyngeal erythema with PND, no tonsillar edema or exudate, no sinus TTP  NECK: no obvious masses on inspection  LUNGS: clear to auscultation bilaterally, no wheezes, rales or rhonchi, good air movement  CV: HRRR, no peripheral edema  MS: moves all extremities without noticeable abnormality  PSYCH: pleasant and cooperative, no  obvious depression or anxiety  ASSESSMENT AND PLAN:  Discussed the following assessment and plan:  Eustachian tube disorder - Plan: fluticasone (FLONASE) 50 MCG/ACT nasal spray  Middle ear effusion - Plan: fluticasone (FLONASE) 50 MCG/ACT nasal spray  - We discussed potential etiologies -We discussed treatment side effects, likely course, and signs of developing a serious illness. -of course, we advised to return or notify a doctor immediately if symptoms worsen or persist or new concerns arise.    Patient Instructions  -AFRIN nasal spray per instructions for 4 days then stop  -flonase for 21 days 2 sprays each nostril  -As we discussed, we have prescribed a new medication for you at this appointment. We discussed the common and serious potential adverse effects of this medication and you can review these and  more with the pharmacist when you pick up your medication.  Please follow the instructions for use carefully and notify us immediately if you have any problems taking this medication.      Lucretia Kern

## 2013-05-24 NOTE — Progress Notes (Signed)
Pre visit review using our clinic review tool, if applicable. No additional management support is needed unless otherwise documented below in the visit note. 

## 2013-06-26 ENCOUNTER — Other Ambulatory Visit: Payer: Self-pay

## 2013-06-26 MED ORDER — FUROSEMIDE 20 MG PO TABS: ORAL_TABLET | ORAL | Status: DC

## 2013-11-04 ENCOUNTER — Telehealth: Payer: Self-pay | Admitting: Internal Medicine

## 2013-11-04 NOTE — Telephone Encounter (Signed)
Spoke with patient and she has had constipation and bright, red blood in stool x 3 days. Scheduled with Dr. Olevia Perches on 11/07/13 at 2:30 Pm.

## 2013-11-07 ENCOUNTER — Encounter: Payer: Self-pay | Admitting: Internal Medicine

## 2013-11-07 ENCOUNTER — Other Ambulatory Visit: Payer: Self-pay

## 2013-11-07 ENCOUNTER — Ambulatory Visit (INDEPENDENT_AMBULATORY_CARE_PROVIDER_SITE_OTHER): Payer: BC Managed Care – PPO | Admitting: Internal Medicine

## 2013-11-07 VITALS — BP 138/90 | HR 72 | Ht 63.25 in | Wt 163.4 lb

## 2013-11-07 DIAGNOSIS — K625 Hemorrhage of anus and rectum: Secondary | ICD-10-CM

## 2013-11-07 DIAGNOSIS — K648 Other hemorrhoids: Secondary | ICD-10-CM

## 2013-11-07 MED ORDER — HYDROCORTISONE ACETATE 25 MG RE SUPP: 25.0000 mg | Freq: Every day | RECTAL | Status: DC

## 2013-11-07 NOTE — Patient Instructions (Signed)
We have sent the following medications to your pharmacy for you to pick up at your convenience: Anusol  If Anusol is too expensive, you may purchase Preparation H suppositories over the counter and use once every night x 12 nights.  Constipation Constipation is when a person has fewer than three bowel movements a week, has difficulty having a bowel movement, or has stools that are dry, hard, or larger than normal. As people grow older, constipation is more common. If you try to fix constipation with medicines that make you have a bowel movement (laxatives), the problem may get worse. Long-term laxative use may cause the muscles of the colon to become weak. A low-fiber diet, not taking in enough fluids, and taking certain medicines may make constipation worse.  CAUSES   Certain medicines, such as antidepressants, pain medicine, iron supplements, antacids, and water pills.   Certain diseases, such as diabetes, irritable bowel syndrome (IBS), thyroid disease, or depression.   Not drinking enough water.   Not eating enough fiber-rich foods.   Stress or travel.   Lack of physical activity or exercise.   Ignoring the urge to have a bowel movement.   Using laxatives too much.  SIGNS AND SYMPTOMS   Having fewer than three bowel movements a week.   Straining to have a bowel movement.   Having stools that are hard, dry, or larger than normal.   Feeling full or bloated.   Pain in the lower abdomen.   Not feeling relief after having a bowel movement.  DIAGNOSIS  Your health care provider will take a medical history and perform a physical exam. Further testing may be done for severe constipation. Some tests may include:  A barium enema X-ray to examine your rectum, colon, and, sometimes, your small intestine.   A sigmoidoscopy to examine your lower colon.   A colonoscopy to examine your entire colon. TREATMENT  Treatment will depend on the severity of your  constipation and what is causing it. Some dietary treatments include drinking more fluids and eating more fiber-rich foods. Lifestyle treatments may include regular exercise. If these diet and lifestyle recommendations do not help, your health care provider may recommend taking over-the-counter laxative medicines to help you have bowel movements. Prescription medicines may be prescribed if over-the-counter medicines do not work.  HOME CARE INSTRUCTIONS   Eat foods that have a lot of fiber, such as fruits, vegetables, whole grains, and beans.  Limit foods high in fat and processed sugars, such as french fries, hamburgers, cookies, candies, and soda.   A fiber supplement may be added to your diet if you cannot get enough fiber from foods.   Drink enough fluids to keep your urine clear or pale yellow.   Exercise regularly or as directed by your health care provider.   Go to the restroom when you have the urge to go. Do not hold it.   Only take over-the-counter or prescription medicines as directed by your health care provider. Do not take other medicines for constipation without talking to your health care provider first.  [place] IF:   You have bright red blood in your stool.   Your constipation lasts for more than 4 days or gets worse.   You have abdominal or rectal pain.   You have thin, pencil-like stools.   You have unexplained weight loss. MAKE SURE YOU:   Understand these instructions.  Will watch your condition.  Will get help right away if you are not  doing well or get worse. Document Released: 10/08/2003 Document Revised: 01/14/2013 Document Reviewed: 10/21/2012 [facility] Patient Information 2015 [place], [place]. This information is not intended to replace advice given to you by your health care provider. Make sure you discuss any questions you have with your health care provider.  High-Fiber Diet Fiber is found in fruits, vegetables, and  grains. A high-fiber diet encourages the addition of more whole grains, legumes, fruits, and vegetables in your diet. The recommended amount of fiber for adult males is 38 g per day. For adult females, it is 25 g per day. Pregnant and lactating women should get 28 g of fiber per day. If you have a digestive or bowel problem, ask your caregiver for advice before adding high-fiber foods to your diet. Eat a variety of high-fiber foods instead of only a select few type of foods.  PURPOSE  To increase stool bulk.  To make bowel movements more regular to prevent constipation.  To lower cholesterol.  To prevent overeating. WHEN IS THIS DIET USED?  It may be used if you have constipation and hemorrhoids.  It may be used if you have uncomplicated diverticulosis (intestine condition) and irritable bowel syndrome.  It may be used if you need help with weight management.  It may be used if you want to add it to your diet as a protective measure against atherosclerosis, diabetes, and cancer. SOURCES OF FIBER  Whole-grain breads and cereals.  Fruits, such as apples, oranges, bananas, berries, prunes, and pears.  Vegetables, such as green peas, carrots, sweet potatoes, beets, broccoli, cabbage, spinach, and artichokes.  Legumes, such split peas, soy, lentils.  Almonds. FIBER CONTENT IN FOODS Starches and Grains / Dietary Fiber (g)  Cheerios, 1 cup / 3 g  Corn Flakes cereal, 1 cup / 0.7 g  Rice crispy treat cereal, 1 cup / 0.3 g  Instant oatmeal (cooked),  cup / 2 g  Frosted wheat cereal, 1 cup / 5.1 g  Brown, long-grain rice (cooked), 1 cup / 3.5 g  White, long-grain rice (cooked), 1 cup / 0.6 g  Enriched macaroni (cooked), 1 cup / 2.5 g Legumes / Dietary Fiber (g)  Baked beans (canned, plain, or vegetarian),  cup / 5.2 g  Kidney beans (canned),  cup / 6.8 g  Pinto beans (cooked),  cup / 5.5 g Breads and Crackers / Dietary Fiber (g)  Plain or honey graham crackers, 2  squares / 0.7 g  Saltine crackers, 3 squares / 0.3 g  Plain, salted pretzels, 10 pieces / 1.8 g  Whole-wheat bread, 1 slice / 1.9 g  White bread, 1 slice / 0.7 g  Raisin bread, 1 slice / 1.2 g  Plain bagel, 3 oz / 2 g  Flour tortilla, 1 oz / 0.9 g  Corn tortilla, 1 small / 1.5 g  Hamburger or hotdog bun, 1 small / 0.9 g Fruits / Dietary Fiber (g)  Apple with skin, 1 medium / 4.4 g  Sweetened applesauce,  cup / 1.5 g  Banana,  medium / 1.5 g  Grapes, 10 grapes / 0.4 g  Orange, 1 small / 2.3 g  Raisin, 1.5 oz / 1.6 g  Melon, 1 cup / 1.4 g Vegetables / Dietary Fiber (g)  Green beans (canned),  cup / 1.3 g  Carrots (cooked),  cup / 2.3 g  Broccoli (cooked),  cup / 2.8 g  Peas (cooked),  cup / 4.4 g  Mashed potatoes,  cup / 1.6 g  Lettuce,  1 cup / 0.5 g  Corn (canned),  cup / 1.6 g  Tomato,  cup / 1.1 g Document Released: 01/09/2005 Document Revised: 07/11/2011 Document Reviewed: 04/13/2011 [facility] Patient Information 2015 [place], [place]. This information is not intended to replace advice given to you by your health care provider. Make sure you discuss any questions you have with your health care provider.

## 2013-11-07 NOTE — Progress Notes (Signed)
Vanessa Fuentes 1945/09/05 [id number]  Note: This dictation was prepared with Dragon digital system. Any transcriptional errors that result from this procedure are unintentional.   History of Present Illness:  This is a 68 year old white female who has had several episodes of low volume  bright red blood per rectum  while  at the beach last [place]. Apparently, she became  constipated after eating a large amount of fried foods and had to strain several times. The blood was in the toilet as well as on the tissue. She denies any rectal pain or abdominal pain. Since returning from vacation several days ago, her bowel habits have been normal and she no longer sees any blood. Pt underwent  colonoscopy in February 2013 with removal of a serrated adenoma. A prior colonoscopy in 2004 showed hyperplastic polyps.    Past Medical History  Diagnosis Date  . Spinal stenosis in cervical region   . Cervicalgia   . Brachial neuritis or radiculitis NOS   . Unspecified essential hypertension   . Degeneration of lumbar or lumbosacral intervertebral disc   . Degeneration of cervical intervertebral disc   . Dysfunction of eustachian tube     Past Surgical History  Procedure Laterality Date  . Tubal ligation    . Cervical discectomy  07/2009    C3-4 with fusion- Dr Janine Ores  . Nasal septum surgery  01/2009    [facility]  . Colonoscopy      polypectomy  . Spine surgery      Allergies  Allergen Reactions  . Sulfonamide Derivatives     Blisters eyes    Family history and social history have been reviewed.  Review of Systems: Negative for abdominal pain fever nausea or vomiting  The remainder of the 10 point ROS is negative except as outlined in the H&P  Physical Exam: General Appearance Well developed, in no distress Eyes  Non icteric  HEENT  Non traumatic, normocephalic  Mouth No lesion, tongue papillated, no cheilosis Neck Supple without adenopathy, thyroid not enlarged, no carotid bruits, no  JVD Lungs Clear to auscultation bilaterally COR Normal S1, normal S2, regular rhythm, no murmur, quiet precordium Abdomen soft nontender abdomen with normoactive bowel sounds. Mild tenderness in right lower quadrant Rectal and anoscopic exam reveals normal perianal area. Normal rectal sphincter tone. Small first-grade hemorrhoids internally which are edematous but there is no active bleeding, no fissure. No proctitis. Stool is Hemoccult negative Extremities  No pedal edema Skin No lesions Neurological Alert and oriented x 3 Psychological Normal mood and affect  Assessment and Plan:   Problem #34 68 year old white female with low volume hematochezia associated with constipation and straining. Today's exam confirms the presence of small internal hemorrhoids which are not bleeding. Her stool is Hemoccult negative. I feel that the bleeding is  Ano- rectal. We will send Anusol-HC Suppositories for her to take at bedtime and if too expensive, she will purchase use over-the-counter Preparation H suppositories each bedtime. I have given her instructions for treatment of constipation. She will be due for a recall colonoscopy in February 2018.    Delfin Edis 11/07/2013

## 2014-01-14 ENCOUNTER — Other Ambulatory Visit: Payer: Self-pay | Admitting: *Deleted

## 2014-01-14 MED ORDER — FUROSEMIDE 20 MG PO TABS: ORAL_TABLET | ORAL | Status: DC

## 2014-02-25 ENCOUNTER — Telehealth: Payer: Self-pay | Admitting: Internal Medicine

## 2014-02-25 DIAGNOSIS — R197 Diarrhea, unspecified: Secondary | ICD-10-CM

## 2014-02-26 MED ORDER — DIPHENOXYLATE-ATROPINE 2.5-0.025 MG PO TABS: ORAL_TABLET | ORAL | Status: DC

## 2014-02-26 NOTE — Telephone Encounter (Signed)
Left a message for patient to call back. 

## 2014-02-26 NOTE — Telephone Encounter (Signed)
Patient calling to report watery, diarrhea x  3 weeks. States she has had it twice today.  Imodium not helping. She took Voltaren 4 weeks ago for her back and it upset her stomach. She stopped taking it but has had problems since taking it.She saw her Neurologist today and was told to call GI. Denies and abdominal pain or bleeding. Just has diarrhea. Please, advise.

## 2014-02-26 NOTE — Telephone Encounter (Signed)
Patient given recommendations. Lab in [facility]. Rx faxed to pharmacy.

## 2014-02-26 NOTE — Telephone Encounter (Signed)
Please send stool for Pathogens, also  Send Lomotil #30, 1 po tid prn diarrhea. Clear liquids x 24 hours then advance as tolerated.

## 2014-03-04 ENCOUNTER — Other Ambulatory Visit: Payer: Self-pay

## 2014-03-05 LAB — GASTROINTESTINAL PATHOGEN PANEL PCR
C. difficile Tox A/B, PCR: NEGATIVE
CRYPTOSPORIDIUM, PCR: NEGATIVE
Campylobacter, PCR: NEGATIVE
E coli (ETEC) LT/ST PCR: NEGATIVE
E coli (STEC) stx1/stx2, PCR: NEGATIVE
E coli 0157, PCR: NEGATIVE
Giardia lamblia, PCR: NEGATIVE
NOROVIRUS, PCR: NEGATIVE
Rotavirus A, PCR: NEGATIVE
Salmonella, PCR: NEGATIVE
Shigella, PCR: NEGATIVE

## 2014-03-20 ENCOUNTER — Telehealth: Payer: Self-pay | Admitting: Internal Medicine

## 2014-03-20 NOTE — Telephone Encounter (Signed)
Patient states she is still having about 2 watery, stools per day.(she had negative GI pathogen panel) She reports they are urgent in nature. Lomotil does help and she is asking for a refill of this. She also wants to know if Dr. Olevia Perches has any other suggestions. Please, advise.

## 2014-03-26 ENCOUNTER — Telehealth: Payer: Self-pay | Admitting: *Deleted

## 2014-03-26 MED ORDER — DIPHENOXYLATE-ATROPINE 2.5-0.025 MG PO TABS: ORAL_TABLET | ORAL | Status: DC

## 2014-03-26 NOTE — Telephone Encounter (Signed)
Faxed Rx for Diphenoxylate-atrop, 2.5-0.025 mg to [facility] in [place], [place]. Tried to reorder Rx four times, but due to printer failure, did not print out.

## 2014-03-26 NOTE — Telephone Encounter (Signed)
Rx for Diphenoxylate-atrop, 2.5-0.025

## 2014-04-07 ENCOUNTER — Encounter: Payer: Self-pay | Admitting: Internal Medicine

## 2014-07-20 ENCOUNTER — Ambulatory Visit (INDEPENDENT_AMBULATORY_CARE_PROVIDER_SITE_OTHER): Payer: BLUE CROSS/BLUE SHIELD | Admitting: Internal Medicine

## 2014-07-20 ENCOUNTER — Other Ambulatory Visit (INDEPENDENT_AMBULATORY_CARE_PROVIDER_SITE_OTHER): Payer: BLUE CROSS/BLUE SHIELD

## 2014-07-20 ENCOUNTER — Encounter: Payer: Self-pay | Admitting: Internal Medicine

## 2014-07-20 VITALS — BP 132/90 | HR 64 | Temp 97.9°F | Ht 63.0 in | Wt 161.8 lb

## 2014-07-20 DIAGNOSIS — R739 Hyperglycemia, unspecified: Secondary | ICD-10-CM

## 2014-07-20 DIAGNOSIS — I10 Essential (primary) hypertension: Secondary | ICD-10-CM

## 2014-07-20 DIAGNOSIS — E785 Hyperlipidemia, unspecified: Secondary | ICD-10-CM

## 2014-07-20 DIAGNOSIS — K589 Irritable bowel syndrome without diarrhea: Secondary | ICD-10-CM | POA: Insufficient documentation

## 2014-07-20 DIAGNOSIS — Z0000 Encounter for general adult medical examination without abnormal findings: Secondary | ICD-10-CM

## 2014-07-20 LAB — URINALYSIS, ROUTINE W REFLEX MICROSCOPIC
Bilirubin Urine: NEGATIVE
HGB URINE DIPSTICK: NEGATIVE
Ketones, ur: NEGATIVE
Leukocytes, UA: NEGATIVE
NITRITE: NEGATIVE
RBC / HPF: NONE SEEN (ref 0–?)
TOTAL PROTEIN, URINE-UPE24: NEGATIVE
URINE GLUCOSE: NEGATIVE
UROBILINOGEN UA: 0.2 (ref 0.0–1.0)
pH: 6 (ref 5.0–8.0)

## 2014-07-20 LAB — LIPID PANEL
Cholesterol: 214 mg/dL — ABNORMAL HIGH (ref 0–200)
HDL: 64.9 mg/dL (ref >39.00)
LDL Cholesterol: 135 mg/dL — ABNORMAL HIGH (ref 0–99)
NONHDL: 149.1
Total CHOL/HDL Ratio: 3
Triglycerides: 72 mg/dL (ref 0.0–149.0)
VLDL: 14.4 mg/dL (ref 0.0–40.0)

## 2014-07-20 LAB — CBC WITH DIFFERENTIAL/PLATELET
BASOS ABS: 0 10*3/uL (ref 0.0–0.1)
Basophils Relative: 0.6 % (ref 0.0–3.0)
Eosinophils Absolute: 0.2 10*3/uL (ref 0.0–0.7)
Eosinophils Relative: 3.4 % (ref 0.0–5.0)
HCT: 44.9 % (ref 36.0–46.0)
HEMOGLOBIN: 15.4 g/dL — AB (ref 12.0–15.0)
LYMPHS PCT: 30.8 % (ref 12.0–46.0)
Lymphs Abs: 1.9 10*3/uL (ref 0.7–4.0)
MCHC: 34.2 g/dL (ref 30.0–36.0)
MCV: 89.8 fl (ref 78.0–100.0)
MONOS PCT: 5.6 % (ref 3.0–12.0)
Monocytes Absolute: 0.3 10*3/uL (ref 0.1–1.0)
NEUTROS ABS: 3.7 10*3/uL (ref 1.4–7.7)
Neutrophils Relative %: 59.6 % (ref 43.0–77.0)
Platelets: 197 10*3/uL (ref 150.0–400.0)
RBC: 5 Mil/uL (ref 3.87–5.11)
RDW: 13.2 % (ref 11.5–15.5)
WBC: 6.2 10*3/uL (ref 4.0–10.5)

## 2014-07-20 LAB — COMPREHENSIVE METABOLIC PANEL
ALT: 16 U/L (ref 0–35)
AST: 18 U/L (ref 0–37)
Albumin: 4.4 g/dL (ref 3.5–5.2)
Alkaline Phosphatase: 76 U/L (ref 39–117)
BUN: 16 mg/dL (ref 6–23)
CALCIUM: 10 mg/dL (ref 8.4–10.5)
CHLORIDE: 103 meq/L (ref 96–112)
CO2: 28 mEq/L (ref 19–32)
Creatinine, Ser: 0.84 mg/dL (ref 0.40–1.20)
GFR: 71.55 mL/min (ref >60.00)
Glucose, Bld: 102 mg/dL — ABNORMAL HIGH (ref 70–99)
Potassium: 4.9 mEq/L (ref 3.5–5.1)
SODIUM: 137 meq/L (ref 135–145)
Total Bilirubin: 0.6 mg/dL (ref 0.2–1.2)
Total Protein: 7.3 g/dL (ref 6.0–8.3)

## 2014-07-20 LAB — TSH: TSH: 0.92 u[IU]/mL (ref 0.35–4.50)

## 2014-07-20 MED ORDER — VALSARTAN 160 MG PO TABS: 160.0000 mg | ORAL_TABLET | Freq: Every day | ORAL | Status: DC

## 2014-07-20 MED ORDER — DIPHENOXYLATE-ATROPINE 2.5-0.025 MG PO TABS: ORAL_TABLET | ORAL | Status: DC

## 2014-07-20 NOTE — Progress Notes (Signed)
Subjective:  Patient ID: Vanessa Fuentes, female    DOB: 11-01-1945  Age: 69 y.o. MRN: [id number]  CC: Hypertension and Annual Exam   HPI Vanessa Fuentes presents for a CPX and HTN - she complains that lasix makes her feel dehydrated and dry. She has a rare episode of diarrhea and wants a refill for lomotil. She offers no other complaints.  Outpatient Prescriptions Prior to Visit  Medication Sig Dispense Refill  . diphenoxylate-atropine (LOMOTIL) 2.5-0.025 MG per tablet Take one po TID prn diarrhea 30 tablet 0  . furosemide (LASIX) 20 MG tablet take 1 tablet by mouth once daily 30 tablet 5  . Cholecalciferol (VITAMIN D3) 1000 UNITS CHEW Chew 2 tablets by mouth daily.    . hydrocortisone (ANUSOL-HC) 25 MG suppository Place 1 suppository (25 mg total) rectally at bedtime. (Patient not taking: Reported on 07/20/2014) 12 suppository 1   No facility-administered medications prior to visit.    ROS Review of Systems  Constitutional: Negative.  Negative for fever, chills, diaphoresis, appetite change and fatigue.  HENT: Negative.  Negative for congestion, dental problem, trouble swallowing and voice change.   Eyes: Negative.   Respiratory: Negative.  Negative for cough, choking, chest tightness, shortness of breath and stridor.   Cardiovascular: Negative.  Negative for chest pain, palpitations and leg swelling.  Gastrointestinal: Negative.  Negative for nausea, abdominal pain, diarrhea, constipation and blood in stool.  Endocrine: Negative.   Genitourinary: Negative.  Negative for dysuria, urgency, frequency, hematuria and flank pain.  Musculoskeletal: Negative.  Negative for myalgias, back pain, joint swelling, arthralgias and gait problem.  Skin: Negative.  Negative for rash.  Allergic/Immunologic: Negative.   Neurological: Negative.  Negative for dizziness, tremors, syncope, light-headedness and headaches.  Hematological: Negative.   Psychiatric/Behavioral: Negative.      Objective:  BP 132/90 mmHg  Pulse 64  Temp(Src) 97.9 F (36.6 C) (Oral)  Ht 5\' 3"  (1.6 m)  Wt 161 lb 12 oz (73.369 kg)  BMI 28.66 kg/m2  SpO2 97%  BP Readings from Last 3 Encounters:  07/20/14 132/90  11/07/13 138/90  05/24/13 134/80    Wt Readings from Last 3 Encounters:  07/20/14 161 lb 12 oz (73.369 kg)  11/07/13 163 lb 6 oz (74.106 kg)  05/24/13 163 lb (73.936 kg)    Physical Exam  Constitutional: She is oriented to person, place, and time. She appears well-developed and well-nourished. No distress.  HENT:  Head: Normocephalic and atraumatic.  Mouth/Throat: Oropharynx is clear and moist. No oropharyngeal exudate.  Eyes: Conjunctivae are normal. Right eye exhibits no discharge. Left eye exhibits no discharge. No scleral icterus.  Neck: Normal range of motion. Neck supple. No JVD present. No tracheal deviation present. No thyromegaly present.  Cardiovascular: Normal rate, regular rhythm, normal heart sounds and intact distal pulses.  Exam reveals no gallop and no friction rub.   No murmur heard. Pulmonary/Chest: Effort normal and breath sounds normal. No stridor. No respiratory distress. She has no wheezes. She has no rales. She exhibits no tenderness.  Abdominal: Soft. Bowel sounds are normal. She exhibits no distension and no mass. There is no tenderness. There is no rebound and no guarding.  Musculoskeletal: Normal range of motion. She exhibits no edema or tenderness.  Lymphadenopathy:    She has no cervical adenopathy.  Neurological: She is oriented to person, place, and time.  Skin: Skin is warm and dry. No rash noted. She is not diaphoretic. No erythema. No pallor.  Psychiatric: She has a normal  mood and affect. Her behavior is normal. Judgment and thought content normal.  Vitals reviewed.   Lab Results  Component Value Date   WBC 6.2 07/20/2014   HGB 15.4* 07/20/2014   HCT 44.9 07/20/2014   PLT 197.0 07/20/2014   GLUCOSE 102* 07/20/2014   CHOL 214*  07/20/2014   TRIG 72.0 07/20/2014   HDL 64.90 07/20/2014   LDLDIRECT 148.3 04/13/2011   LDLCALC 135* 07/20/2014   ALT 16 07/20/2014   AST 18 07/20/2014   NA 137 07/20/2014   K 4.9 07/20/2014   CL 103 07/20/2014   CREATININE 0.84 07/20/2014   BUN 16 07/20/2014   CO2 28 07/20/2014   TSH 0.92 07/20/2014   HGBA1C 5.1 04/23/2013    No results found.  Assessment & Plan:   Vanessa Fuentes was seen today for hypertension and annual exam.  Diagnoses and all orders for this visit:  IBS (irritable bowel syndrome) - prior evaluation by GI, will cont lomotil as needed Orders: -     diphenoxylate-atropine (LOMOTIL) 2.5-0.025 MG per tablet; Take one po TID prn diarrhea  Essential hypertension - she is hemo-concentrated and does not feel well on lasix, will change to an ARB Orders: -     valsartan (DIOVAN) 160 MG tablet; Take 1 tablet (160 mg total) by mouth daily. -     Comprehensive metabolic panel; Future -     CBC with Differential/Platelet; Future -     Urinalysis, Routine w reflex microscopic (not at [facility]); Future  Hyperglycemia - her blood sugars are well controlled Orders: -     Comprehensive metabolic panel; Future  Hyperlipidemia with target LDL less than 130 - I don't think she needs to start a statin Orders: -     Lipid panel; Future -     TSH; Future  I have discontinued Vanessa Fuentes's hydrocortisone and furosemide. I am also having her start on valsartan. Additionally, I am having her maintain her Vitamin D3 and diphenoxylate-atropine.  Meds ordered this encounter  Medications  . diphenoxylate-atropine (LOMOTIL) 2.5-0.025 MG per tablet    Sig: Take one po TID prn diarrhea    Dispense:  30 tablet    Refill:  2  . valsartan (DIOVAN) 160 MG tablet    Sig: Take 1 tablet (160 mg total) by mouth daily.    Dispense:  90 tablet    Refill:  3   See AVS for instructions about healthy living and anticipatory guidance.  Follow-up: Return in about 6 months (around  01/19/2015).  Vanessa Calico, MD

## 2014-07-20 NOTE — Patient Instructions (Signed)
Preventive Care for Adults A healthy lifestyle and preventive care can promote health and wellness. Preventive health guidelines for women include the following key practices.  A routine yearly physical is a good way to check with your health care provider about your health and preventive screening. It is a chance to share any concerns and updates on your health and to receive a thorough exam.  Visit your dentist for a routine exam and preventive care every 6 months. Brush your teeth twice a day and floss once a day. Good oral hygiene prevents tooth decay and gum disease.  The frequency of eye exams is based on your age, health, family medical history, use of contact lenses, and other factors. Follow your health care provider's recommendations for frequency of eye exams.  Eat a healthy diet. Foods like vegetables, fruits, whole grains, low-fat dairy products, and lean protein foods contain the nutrients you need without too many calories. Decrease your intake of foods high in solid fats, added sugars, and salt. Eat the right amount of calories for you.Get information about a proper diet from your health care provider, if necessary.  Regular physical exercise is one of the most important things you can do for your health. Most adults should get at least 150 minutes of moderate-intensity exercise (any activity that increases your heart rate and causes you to sweat) each week. In addition, most adults need muscle-strengthening exercises on 2 or more days a week.  Maintain a healthy weight. The body mass index (BMI) is a screening tool to identify possible weight problems. It provides an estimate of body fat based on height and weight. Your health care provider can find your BMI and can help you achieve or maintain a healthy weight.For adults 20 years and older:  A BMI below 18.5 is considered underweight.  A BMI of 18.5 to 24.9 is normal.  A BMI of 25 to 29.9 is considered overweight.  A BMI of  30 and above is considered obese.  Maintain normal blood lipids and cholesterol levels by exercising and minimizing your intake of saturated fat. Eat a balanced diet with plenty of fruit and vegetables. Blood tests for lipids and cholesterol should begin at age 76 and be repeated every 5 years. If your lipid or cholesterol levels are high, you are over 50, or you are at high risk for heart disease, you may need your cholesterol levels checked more frequently.Ongoing high lipid and cholesterol levels should be treated with medicines if diet and exercise are not working.  If you smoke, find out from your health care provider how to quit. If you do not use tobacco, do not start.  Lung cancer screening is recommended for adults aged 22-80 years who are at high risk for developing lung cancer because of a history of smoking. A yearly low-dose CT scan of the lungs is recommended for people who have at least a 30-pack-year history of smoking and are a current smoker or have quit within the past 15 years. A pack year of smoking is smoking an average of 1 pack of cigarettes a day for 1 year (for example: 1 pack a day for 30 years or 2 packs a day for 15 years). Yearly screening should continue until the smoker has stopped smoking for at least 15 years. Yearly screening should be stopped for people who develop a health problem that would prevent them from having lung cancer treatment.  If you are pregnant, do not drink alcohol. If you are breastfeeding,  be very cautious about drinking alcohol. If you are not pregnant and choose to drink alcohol, do not have more than 1 drink per day. One drink is considered to be 12 ounces (355 mL) of beer, 5 ounces (148 mL) of wine, or 1.5 ounces (44 mL) of liquor.  Avoid use of street drugs. Do not share needles with anyone. Ask for help if you need support or instructions about stopping the use of drugs.  High blood pressure causes heart disease and increases the risk of  stroke. Your blood pressure should be checked at least every 1 to 2 years. Ongoing high blood pressure should be treated with medicines if weight loss and exercise do not work.  If you are 3-86 years old, ask your health care provider if you should take aspirin to prevent strokes.  Diabetes screening involves taking a blood sample to check your fasting blood sugar level. This should be done once every 3 years, after age 67, if you are within normal weight and without risk factors for diabetes. Testing should be considered at a younger age or be carried out more frequently if you are overweight and have at least 1 risk factor for diabetes.  Breast cancer screening is essential preventive care for women. You should practice "breast self-awareness." This means understanding the normal appearance and feel of your breasts and may include breast self-examination. Any changes detected, no matter how small, should be reported to a health care provider. Women in their 8s and 30s should have a clinical breast exam (CBE) by a health care provider as part of a regular health exam every 1 to 3 years. After age 70, women should have a CBE every year. Starting at age 25, women should consider having a mammogram (breast X-ray test) every year. Women who have a family history of breast cancer should talk to their health care provider about genetic screening. Women at a high risk of breast cancer should talk to their health care providers about having an MRI and a mammogram every year.  Breast cancer gene (BRCA)-related cancer risk assessment is recommended for women who have family members with BRCA-related cancers. BRCA-related cancers include breast, ovarian, tubal, and peritoneal cancers. Having family members with these cancers may be associated with an increased risk for harmful changes (mutations) in the breast cancer genes BRCA1 and BRCA2. Results of the assessment will determine the need for genetic counseling and  BRCA1 and BRCA2 testing.  Routine pelvic exams to screen for cancer are no longer recommended for nonpregnant women who are considered low risk for cancer of the pelvic organs (ovaries, uterus, and vagina) and who do not have symptoms. Ask your health care provider if a screening pelvic exam is right for you.  If you have had past treatment for cervical cancer or a condition that could lead to cancer, you need Pap tests and screening for cancer for at least 20 years after your treatment. If Pap tests have been discontinued, your risk factors (such as having a new sexual partner) need to be reassessed to determine if screening should be resumed. Some women have medical problems that increase the chance of getting cervical cancer. In these cases, your health care provider may recommend more frequent screening and Pap tests.  The HPV test is an additional test that may be used for cervical cancer screening. The HPV test looks for the virus that can cause the cell changes on the cervix. The cells collected during the Pap test can be  tested for HPV. The HPV test could be used to screen women aged 30 years and older, and should be used in women of any age who have unclear Pap test results. After the age of 30, women should have HPV testing at the same frequency as a Pap test.  Colorectal cancer can be detected and often prevented. Most routine colorectal cancer screening begins at the age of 50 years and continues through age 75 years. However, your health care provider may recommend screening at an earlier age if you have risk factors for colon cancer. On a yearly basis, your health care provider may provide home test kits to check for hidden blood in the stool. Use of a small camera at the end of a tube, to directly examine the colon (sigmoidoscopy or colonoscopy), can detect the earliest forms of colorectal cancer. Talk to your health care provider about this at age 50, when routine screening begins. Direct  exam of the colon should be repeated every 5-10 years through age 75 years, unless early forms of pre-cancerous polyps or small growths are found.  People who are at an increased risk for hepatitis B should be screened for this virus. You are considered at high risk for hepatitis B if:  You were born in a country where hepatitis B occurs often. Talk with your health care provider about which countries are considered high risk.  Your parents were born in a high-risk country and you have not received a shot to protect against hepatitis B (hepatitis B vaccine).  You have HIV or AIDS.  You use needles to inject street drugs.  You live with, or have sex with, someone who has hepatitis B.  You get hemodialysis treatment.  You take certain medicines for conditions like cancer, organ transplantation, and autoimmune conditions.  Hepatitis C blood testing is recommended for all people born from 1945 through 1965 and any individual with known risks for hepatitis C.  Practice safe sex. Use condoms and avoid high-risk sexual practices to reduce the spread of sexually transmitted infections (STIs). STIs include gonorrhea, chlamydia, syphilis, trichomonas, herpes, HPV, and human immunodeficiency virus (HIV). Herpes, HIV, and HPV are viral illnesses that have no cure. They can result in disability, cancer, and death.  You should be screened for sexually transmitted illnesses (STIs) including gonorrhea and chlamydia if:  You are sexually active and are younger than 24 years.  You are older than 24 years and your health care provider tells you that you are at risk for this type of infection.  Your sexual activity has changed since you were last screened and you are at an increased risk for chlamydia or gonorrhea. Ask your health care provider if you are at risk.  If you are at risk of being infected with HIV, it is recommended that you take a prescription medicine daily to prevent HIV infection. This is  called preexposure prophylaxis (PrEP). You are considered at risk if:  You are a heterosexual woman, are sexually active, and are at increased risk for HIV infection.  You take drugs by injection.  You are sexually active with a partner who has HIV.  Talk with your health care provider about whether you are at high risk of being infected with HIV. If you choose to begin PrEP, you should first be tested for HIV. You should then be tested every 3 months for as long as you are taking PrEP.  Osteoporosis is a disease in which the bones lose minerals and strength   with aging. This can result in serious bone fractures or breaks. The risk of osteoporosis can be identified using a bone density scan. Women ages 65 years and over and women at risk for fractures or osteoporosis should discuss screening with their health care providers. Ask your health care provider whether you should take a calcium supplement or vitamin D to reduce the rate of osteoporosis.  Menopause can be associated with physical symptoms and risks. Hormone replacement therapy is available to decrease symptoms and risks. You should talk to your health care provider about whether hormone replacement therapy is right for you.  Use sunscreen. Apply sunscreen liberally and repeatedly throughout the day. You should seek shade when your shadow is shorter than you. Protect yourself by wearing long sleeves, pants, a wide-brimmed hat, and sunglasses year round, whenever you are outdoors.  Once a month, do a whole body skin exam, using a mirror to look at the skin on your back. Tell your health care provider of new moles, moles that have irregular borders, moles that are larger than a pencil eraser, or moles that have changed in shape or color.  Stay current with required vaccines (immunizations).  Influenza vaccine. All adults should be immunized every year.  Tetanus, diphtheria, and acellular pertussis (Td, Tdap) vaccine. Pregnant women should  receive 1 dose of Tdap vaccine during each pregnancy. The dose should be obtained regardless of the length of time since the last dose. Immunization is preferred during the 27th-36th week of gestation. An adult who has not previously received Tdap or who does not know her vaccine status should receive 1 dose of Tdap. This initial dose should be followed by tetanus and diphtheria toxoids (Td) booster doses every 10 years. Adults with an unknown or incomplete history of completing a 3-dose immunization series with Td-containing vaccines should begin or complete a primary immunization series including a Tdap dose. Adults should receive a Td booster every 10 years.  Varicella vaccine. An adult without evidence of immunity to varicella should receive 2 doses or a second dose if she has previously received 1 dose. Pregnant females who do not have evidence of immunity should receive the first dose after pregnancy. This first dose should be obtained before leaving the health care facility. The second dose should be obtained 4-8 weeks after the first dose.  Human papillomavirus (HPV) vaccine. Females aged 13-26 years who have not received the vaccine previously should obtain the 3-dose series. The vaccine is not recommended for use in pregnant females. However, pregnancy testing is not needed before receiving a dose. If a female is found to be pregnant after receiving a dose, no treatment is needed. In that case, the remaining doses should be delayed until after the pregnancy. Immunization is recommended for any person with an immunocompromised condition through the age of 26 years if she did not get any or all doses earlier. During the 3-dose series, the second dose should be obtained 4-8 weeks after the first dose. The third dose should be obtained 24 weeks after the first dose and 16 weeks after the second dose.  Zoster vaccine. One dose is recommended for adults aged 60 years or older unless certain conditions are  present.  Measles, mumps, and rubella (MMR) vaccine. Adults born before 1957 generally are considered immune to measles and mumps. Adults born in 1957 or later should have 1 or more doses of MMR vaccine unless there is a contraindication to the vaccine or there is laboratory evidence of immunity to   each of the three diseases. A routine second dose of MMR vaccine should be obtained at least 28 days after the first dose for students attending postsecondary schools, health care workers, or international travelers. People who received inactivated measles vaccine or an unknown type of measles vaccine during 1963-1967 should receive 2 doses of MMR vaccine. People who received inactivated mumps vaccine or an unknown type of mumps vaccine before 1979 and are at high risk for mumps infection should consider immunization with 2 doses of MMR vaccine. For females of childbearing age, rubella immunity should be determined. If there is no evidence of immunity, females who are not pregnant should be vaccinated. If there is no evidence of immunity, females who are pregnant should delay immunization until after pregnancy. Unvaccinated health care workers born before 1957 who lack laboratory evidence of measles, mumps, or rubella immunity or laboratory confirmation of disease should consider measles and mumps immunization with 2 doses of MMR vaccine or rubella immunization with 1 dose of MMR vaccine.  Pneumococcal 13-valent conjugate (PCV13) vaccine. When indicated, a person who is uncertain of her immunization history and has no record of immunization should receive the PCV13 vaccine. An adult aged 19 years or older who has certain medical conditions and has not been previously immunized should receive 1 dose of PCV13 vaccine. This PCV13 should be followed with a dose of pneumococcal polysaccharide (PPSV23) vaccine. The PPSV23 vaccine dose should be obtained at least 8 weeks after the dose of PCV13 vaccine. An adult aged 19  years or older who has certain medical conditions and previously received 1 or more doses of PPSV23 vaccine should receive 1 dose of PCV13. The PCV13 vaccine dose should be obtained 1 or more years after the last PPSV23 vaccine dose.  Pneumococcal polysaccharide (PPSV23) vaccine. When PCV13 is also indicated, PCV13 should be obtained first. All adults aged 65 years and older should be immunized. An adult younger than age 65 years who has certain medical conditions should be immunized. Any person who resides in a nursing home or long-term care facility should be immunized. An adult smoker should be immunized. People with an immunocompromised condition and certain other conditions should receive both PCV13 and PPSV23 vaccines. People with human immunodeficiency virus (HIV) infection should be immunized as soon as possible after diagnosis. Immunization during chemotherapy or radiation therapy should be avoided. Routine use of PPSV23 vaccine is not recommended for American Indians, [place] Natives, or people younger than 65 years unless there are medical conditions that require PPSV23 vaccine. When indicated, people who have unknown immunization and have no record of immunization should receive PPSV23 vaccine. One-time revaccination 5 years after the first dose of PPSV23 is recommended for people aged 19-64 years who have chronic kidney failure, nephrotic syndrome, asplenia, or immunocompromised conditions. People who received 1-2 doses of PPSV23 before age 65 years should receive another dose of PPSV23 vaccine at age 65 years or later if at least 5 years have passed since the previous dose. Doses of PPSV23 are not needed for people immunized with PPSV23 at or after age 65 years.  Meningococcal vaccine. Adults with asplenia or persistent complement component deficiencies should receive 2 doses of quadrivalent meningococcal conjugate (MenACWY-D) vaccine. The doses should be obtained at least 2 months apart.  Microbiologists working with certain meningococcal bacteria, military recruits, people at risk during an outbreak, and people who travel to or live in countries with a high rate of meningitis should be immunized. A first-year college student up through age   21 years who is living in a residence hall should receive a dose if she did not receive a dose on or after her 16th birthday. Adults who have certain high-risk conditions should receive one or more doses of vaccine.  Hepatitis A vaccine. Adults who wish to be protected from this disease, have certain high-risk conditions, work with hepatitis A-infected animals, work in hepatitis A research labs, or travel to or work in countries with a high rate of hepatitis A should be immunized. Adults who were previously unvaccinated and who anticipate close contact with an international adoptee during the first 60 days after arrival in the [place] from a country with a high rate of hepatitis A should be immunized.  Hepatitis B vaccine. Adults who wish to be protected from this disease, have certain high-risk conditions, may be exposed to blood or other infectious body fluids, are household contacts or sex partners of hepatitis B positive people, are clients or workers in certain care facilities, or travel to or work in countries with a high rate of hepatitis B should be immunized.  Haemophilus influenzae type b (Hib) vaccine. A previously unvaccinated person with asplenia or sickle cell disease or having a scheduled splenectomy should receive 1 dose of Hib vaccine. Regardless of previous immunization, a recipient of a hematopoietic stem cell transplant should receive a 3-dose series 6-12 months after her successful transplant. Hib vaccine is not recommended for adults with HIV infection. Preventive Services / Frequency Ages 64 to 68 years  Blood pressure check.** / Every 1 to 2 years.  Lipid and cholesterol check.** / Every 5 years beginning at age  22.  Clinical breast exam.** / Every 3 years for women in their 88s and 53s.  BRCA-related cancer risk assessment.** / For women who have family members with a BRCA-related cancer (breast, ovarian, tubal, or peritoneal cancers).  Pap test.** / Every 2 years from ages 90 through 51. Every 3 years starting at age 21 through age 56 or 3 with a history of 3 consecutive normal Pap tests.  HPV screening.** / Every 3 years from ages 24 through ages 1 to 46 with a history of 3 consecutive normal Pap tests.  Hepatitis C blood test.** / For any individual with known risks for hepatitis C.  Skin self-exam. / Monthly.  Influenza vaccine. / Every year.  Tetanus, diphtheria, and acellular pertussis (Tdap, Td) vaccine.** / Consult your health care provider. Pregnant women should receive 1 dose of Tdap vaccine during each pregnancy. 1 dose of Td every 10 years.  Varicella vaccine.** / Consult your health care provider. Pregnant females who do not have evidence of immunity should receive the first dose after pregnancy.  HPV vaccine. / 3 doses over 6 months, if 72 and younger. The vaccine is not recommended for use in pregnant females. However, pregnancy testing is not needed before receiving a dose.  Measles, mumps, rubella (MMR) vaccine.** / You need at least 1 dose of MMR if you were born in 1957 or later. You may also need a 2nd dose. For females of childbearing age, rubella immunity should be determined. If there is no evidence of immunity, females who are not pregnant should be vaccinated. If there is no evidence of immunity, females who are pregnant should delay immunization until after pregnancy.  Pneumococcal 13-valent conjugate (PCV13) vaccine.** / Consult your health care provider.  Pneumococcal polysaccharide (PPSV23) vaccine.** / 1 to 2 doses if you smoke cigarettes or if you have certain conditions.  Meningococcal vaccine.** /  1 dose if you are age 19 to 21 years and a first-year college  student living in a residence hall, or have one of several medical conditions, you need to get vaccinated against meningococcal disease. You may also need additional booster doses.  Hepatitis A vaccine.** / Consult your health care provider.  Hepatitis B vaccine.** / Consult your health care provider.  Haemophilus influenzae type b (Hib) vaccine.** / Consult your health care provider. Ages 40 to 64 years  Blood pressure check.** / Every 1 to 2 years.  Lipid and cholesterol check.** / Every 5 years beginning at age 20 years.  Lung cancer screening. / Every year if you are aged 55-80 years and have a 30-pack-year history of smoking and currently smoke or have quit within the past 15 years. Yearly screening is stopped once you have quit smoking for at least 15 years or develop a health problem that would prevent you from having lung cancer treatment.  Clinical breast exam.** / Every year after age 40 years.  BRCA-related cancer risk assessment.** / For women who have family members with a BRCA-related cancer (breast, ovarian, tubal, or peritoneal cancers).  Mammogram.** / Every year beginning at age 40 years and continuing for as long as you are in good health. Consult with your health care provider.  Pap test.** / Every 3 years starting at age 30 years through age 65 or 70 years with a history of 3 consecutive normal Pap tests.  HPV screening.** / Every 3 years from ages 30 years through ages 65 to 70 years with a history of 3 consecutive normal Pap tests.  Fecal occult blood test (FOBT) of stool. / Every year beginning at age 50 years and continuing until age 75 years. You may not need to do this test if you get a colonoscopy every 10 years.  Flexible sigmoidoscopy or colonoscopy.** / Every 5 years for a flexible sigmoidoscopy or every 10 years for a colonoscopy beginning at age 50 years and continuing until age 75 years.  Hepatitis C blood test.** / For all people born from 1945 through  1965 and any individual with known risks for hepatitis C.  Skin self-exam. / Monthly.  Influenza vaccine. / Every year.  Tetanus, diphtheria, and acellular pertussis (Tdap/Td) vaccine.** / Consult your health care provider. Pregnant women should receive 1 dose of Tdap vaccine during each pregnancy. 1 dose of Td every 10 years.  Varicella vaccine.** / Consult your health care provider. Pregnant females who do not have evidence of immunity should receive the first dose after pregnancy.  Zoster vaccine.** / 1 dose for adults aged 60 years or older.  Measles, mumps, rubella (MMR) vaccine.** / You need at least 1 dose of MMR if you were born in 1957 or later. You may also need a 2nd dose. For females of childbearing age, rubella immunity should be determined. If there is no evidence of immunity, females who are not pregnant should be vaccinated. If there is no evidence of immunity, females who are pregnant should delay immunization until after pregnancy.  Pneumococcal 13-valent conjugate (PCV13) vaccine.** / Consult your health care provider.  Pneumococcal polysaccharide (PPSV23) vaccine.** / 1 to 2 doses if you smoke cigarettes or if you have certain conditions.  Meningococcal vaccine.** / Consult your health care provider.  Hepatitis A vaccine.** / Consult your health care provider.  Hepatitis B vaccine.** / Consult your health care provider.  Haemophilus influenzae type b (Hib) vaccine.** / Consult your health care provider. Ages 65   years and over  Blood pressure check.** / Every 1 to 2 years.  Lipid and cholesterol check.** / Every 5 years beginning at age 22 years.  Lung cancer screening. / Every year if you are aged 73-80 years and have a 30-pack-year history of smoking and currently smoke or have quit within the past 15 years. Yearly screening is stopped once you have quit smoking for at least 15 years or develop a health problem that would prevent you from having lung cancer  treatment.  Clinical breast exam.** / Every year after age 4 years.  BRCA-related cancer risk assessment.** / For women who have family members with a BRCA-related cancer (breast, ovarian, tubal, or peritoneal cancers).  Mammogram.** / Every year beginning at age 40 years and continuing for as long as you are in good health. Consult with your health care provider.  Pap test.** / Every 3 years starting at age 9 years through age 34 or 91 years with 3 consecutive normal Pap tests. Testing can be stopped between 65 and 70 years with 3 consecutive normal Pap tests and no abnormal Pap or HPV tests in the past 10 years.  HPV screening.** / Every 3 years from ages 57 years through ages 64 or 45 years with a history of 3 consecutive normal Pap tests. Testing can be stopped between 65 and 70 years with 3 consecutive normal Pap tests and no abnormal Pap or HPV tests in the past 10 years.  Fecal occult blood test (FOBT) of stool. / Every year beginning at age 15 years and continuing until age 17 years. You may not need to do this test if you get a colonoscopy every 10 years.  Flexible sigmoidoscopy or colonoscopy.** / Every 5 years for a flexible sigmoidoscopy or every 10 years for a colonoscopy beginning at age 86 years and continuing until age 71 years.  Hepatitis C blood test.** / For all people born from [birth year] through 1965 and any individual with known risks for hepatitis C.  Osteoporosis screening.** / A one-time screening for women ages 83 years and over and women at risk for fractures or osteoporosis.  Skin self-exam. / Monthly.  Influenza vaccine. / Every year.  Tetanus, diphtheria, and acellular pertussis (Tdap/Td) vaccine.** / 1 dose of Td every 10 years.  Varicella vaccine.** / Consult your health care provider.  Zoster vaccine.** / 1 dose for adults aged 61 years or older.  Pneumococcal 13-valent conjugate (PCV13) vaccine.** / Consult your health care provider.  Pneumococcal  polysaccharide (PPSV23) vaccine.** / 1 dose for all adults aged 28 years and older.  Meningococcal vaccine.** / Consult your health care provider.  Hepatitis A vaccine.** / Consult your health care provider.  Hepatitis B vaccine.** / Consult your health care provider.  Haemophilus influenzae type b (Hib) vaccine.** / Consult your health care provider. ** Family history and personal history of risk and conditions may change your health care provider's recommendations. Document Released: 03/07/2001 Document Revised: 05/26/2013 Document Reviewed: 06/06/2010 [facility] Patient Information 2015 [place], [place]. This information is not intended to replace advice given to you by your health care provider. Make sure you discuss any questions you have with your health care provider.

## 2014-07-20 NOTE — Progress Notes (Signed)
Pre visit review using our clinic review tool, if applicable. No additional management support is needed unless otherwise documented below in the visit note. 

## 2014-07-21 ENCOUNTER — Encounter: Payer: Self-pay | Admitting: Internal Medicine

## 2014-07-21 NOTE — Assessment & Plan Note (Signed)

## 2014-08-11 ENCOUNTER — Other Ambulatory Visit: Payer: Self-pay | Admitting: Geriatric Medicine

## 2014-09-10 ENCOUNTER — Other Ambulatory Visit: Payer: Self-pay

## 2014-09-10 MED ORDER — FUROSEMIDE 20 MG PO TABS: 20.0000 mg | ORAL_TABLET | Freq: Every day | ORAL | Status: DC

## 2014-09-10 NOTE — Telephone Encounter (Signed)
Rf on furosemide from historical provider. 20 MG tabs 1 tab daily. Ok to RF?

## 2014-11-25 ENCOUNTER — Encounter: Payer: Self-pay | Admitting: Gastroenterology

## 2014-11-25 ENCOUNTER — Encounter: Payer: Self-pay | Admitting: Internal Medicine

## 2014-11-25 ENCOUNTER — Ambulatory Visit (INDEPENDENT_AMBULATORY_CARE_PROVIDER_SITE_OTHER): Payer: BLUE CROSS/BLUE SHIELD | Admitting: Internal Medicine

## 2014-11-25 VITALS — BP 138/80 | HR 81 | Temp 99.5°F | Wt 161.0 lb

## 2014-11-25 DIAGNOSIS — J309 Allergic rhinitis, unspecified: Secondary | ICD-10-CM

## 2014-11-25 DIAGNOSIS — R05 Cough: Secondary | ICD-10-CM | POA: Diagnosis not present

## 2014-11-25 DIAGNOSIS — R059 Cough, unspecified: Secondary | ICD-10-CM

## 2014-11-25 MED ORDER — AMOXICILLIN 500 MG PO CAPS: 500.0000 mg | ORAL_CAPSULE | Freq: Three times a day (TID) | ORAL | Status: DC

## 2014-11-25 MED ORDER — HYDROCODONE-HOMATROPINE 5-1.5 MG/5ML PO SYRP: 5.0000 mL | ORAL_SOLUTION | Freq: Three times a day (TID) | ORAL | Status: DC | PRN

## 2014-11-25 NOTE — Patient Instructions (Signed)
Plain Mucinex (NOT D) for thick secretions ;force NON dairy fluids .   Nasal cleansing in the shower as discussed with lather of mild shampoo.After 10 seconds wash off lather while  exhaling through nostrils. Make sure that all residual soap is removed to prevent irritation.  Flonase OR Nasacort AQ 1 spray in each nostril twice a day as needed. Use the "crossover" technique into opposite nostril spraying toward opposite ear @ 45 degree angle, not straight up into nostril.  Plain Allegra (NOT D )  160 daily , Loratidine 10 mg , OR Zyrtec 10 mg @ bedtime  as needed for itchy eyes & sneezing.  Fill the  prescription for antibiotic if fever, discolored nasal or chest secretions or significant pain above & below eyes appear in the next 48-72 hours. 

## 2014-11-25 NOTE — Progress Notes (Signed)
Pre visit review using our clinic review tool, if applicable. No additional management support is needed unless otherwise documented below in the visit note. 

## 2014-11-25 NOTE — Progress Notes (Signed)
   Subjective:    Patient ID: Vanessa Fuentes, female    DOB: 1945/06/27, 69 y.o.   MRN: [id number]  HPI 2 weeks ago she had upper respiratory tract symptoms for approximately a week manifested as rhinitis with clear drainage and nasal congestion. This was associated with frontal and paranasal congestion; maxillary sinus pressure; sore throat; nonproductive cough; itchy/watery eyes; and popping in the left ear. Symptoms responded to NyQuil and Zyrtec but recurred last night.  With this episode she's had chills and fatigue in addition to the symptoms listed above.  Review of Systems  She denies any nasal purulence or purulent sputum. She's had no definite fever. She has no otic pain or discharge. She also denies shortness of breath or wheezing.     Objective:   Physical Exam General appearance:Adequately nourished; no acute distress or increased work of breathing is present.    Lymphatic: No  lymphadenopathy about the head, neck, or axilla .  Eyes: No conjunctival inflammation or lid edema is present. There is no scleral icterus.  Ears:  External ear exam shows no significant lesions or deformities.  Otoscopic examination reveals clear canals, tympanic membranes are intact bilaterally without bulging, retraction, inflammation or discharge.  Nose:  External nasal examination shows no deformity or inflammation. Nasal mucosa are erythematous without lesions or exudates No septal dislocation or deviation.No obstruction to airflow.   Oral exam: Dental hygiene is good; lips and gums are healthy appearing.There is no oropharyngeal erythema or exudate .  Neck:  No deformities, thyromegaly, masses, or tenderness noted.   Supple with full range of motion without pain.   Heart:  Normal rate and regular rhythm. S1 and S2 normal without gallop, murmur, click, rub or other extra sounds.   Lungs:Chest clear to auscultation; no wheezes, rhonchi,rales ,or rubs present.  Extremities:  No cyanosis,  edema, or clubbing  noted    Skin: Warm & dry w/o tenting or jaundice. No significant lesions or rash.     Assessment & Plan:  #1 allergic rhinitis See orders & AVS

## 2015-01-21 ENCOUNTER — Ambulatory Visit (AMBULATORY_SURGERY_CENTER): Payer: Self-pay

## 2015-01-21 VITALS — Ht 64.0 in | Wt 159.2 lb

## 2015-01-21 DIAGNOSIS — Z860100 Personal history of colon polyps, unspecified: Secondary | ICD-10-CM

## 2015-01-21 MED ORDER — SUPREP BOWEL PREP KIT 17.5-3.13-1.6 GM/177ML PO SOLN: 1.0000 | Freq: Once | ORAL | Status: DC

## 2015-01-21 NOTE — Progress Notes (Signed)
No allergies to eggs or soy No diet/weight loss meds No home oxygen No past problems with anesthesia  Has email and internet; refused emmi 

## 2015-01-24 DIAGNOSIS — M810 Age-related osteoporosis without current pathological fracture: Secondary | ICD-10-CM

## 2015-01-24 HISTORY — DX: Age-related osteoporosis without current pathological fracture: M81.0

## 2015-02-04 ENCOUNTER — Encounter: Payer: Self-pay | Admitting: Gastroenterology

## 2015-02-04 ENCOUNTER — Ambulatory Visit (AMBULATORY_SURGERY_CENTER): Payer: BLUE CROSS/BLUE SHIELD | Admitting: Gastroenterology

## 2015-02-04 VITALS — BP 109/72 | HR 69 | Temp 96.6°F | Resp 15 | Ht 64.0 in | Wt 159.0 lb

## 2015-02-04 DIAGNOSIS — D123 Benign neoplasm of transverse colon: Secondary | ICD-10-CM | POA: Diagnosis not present

## 2015-02-04 DIAGNOSIS — Z86010 Personal history of colonic polyps: Secondary | ICD-10-CM

## 2015-02-04 LAB — HM COLONOSCOPY

## 2015-02-04 MED ORDER — SODIUM CHLORIDE 0.9 % IV SOLN: 500.0000 mL | INTRAVENOUS | Status: DC

## 2015-02-04 NOTE — Op Note (Signed)
[place]  [address] [place], [address]   COLONOSCOPY PROCEDURE REPORT  PATIENT: Vanessa Fuentes, Vanessa Fuentes  MR#: [id number] BIRTHDATE: September 13, 1945 , 69  yrs. old GENDER: female ENDOSCOPIST: Harl Bowie, MD REFERRED [id number] Ronnald Ramp, MD PROCEDURE DATE:  02/04/2015 PROCEDURE:   Colonoscopy, surveillance and Colonoscopy with snare polypectomy First Screening Colonoscopy - Avg.  risk and is 50 yrs.  old or older - No.  Prior Negative Screening - Now for repeat screening. N/A  History of Adenoma - Now for follow-up colonoscopy & has been > or = to 3 yrs.  Yes hx of adenoma.  Has been 3 or more years since last colonoscopy.  Polyps removed today? Yes ASA CLASS:   Class I INDICATIONS:Surveillance due to prior colonic neoplasia and PH Colon Adenoma. MEDICATIONS: Propofol 200 mg IV  DESCRIPTION OF PROCEDURE:   After the risks benefits and alternatives of the procedure were thoroughly explained, informed consent was obtained.  The digital rectal exam revealed no abnormalities of the rectum.   The LB [id number]-[id number]  endoscope was introduced through the anus and advanced to the terminal ileum which was intubated for a short distance. No adverse events experienced.   The quality of the prep was good.  The instrument was then slowly withdrawn as the colon was fully examined. Estimated blood loss is zero unless otherwise noted in this procedure report.   COLON FINDINGS: Two sessile polyps ranging between 5-38mm in size were found in the transverse colon.  Polypectomies were performed with a cold snare.  The resection was complete, the polyp tissue was completely retrieved and sent to histology.  Retroflexed views revealed internal hemorrhoids. The time to cecum = 6.9 Withdrawal time = 10.5   The scope was withdrawn and the procedure completed. COMPLICATIONS: There were no immediate complications.  ENDOSCOPIC IMPRESSION: Two sessile polyps ranging between 5-4mm in size  were found in the transverse colon; polypectomies were performed with a cold snare  RECOMMENDATIONS: 1.  If the polyp(s) removed today are proven to be adenomatous (pre-cancerous) polyps, you will need a repeat colonoscopy in 5 years.  Otherwise you should continue to follow colorectal cancer screening guidelines for "routine risk" patients with colonoscopy in 10 years.  You will receive a letter within 1-2 weeks with the results of your biopsy as well as final recommendations.  Please call my office if you have not received a letter after 3 weeks. 2.  Await pathology results  eSigned:  Harl Bowie, MD 02/04/2015 8:41 AM

## 2015-02-04 NOTE — Progress Notes (Signed)
Called to room to assist during endoscopic procedure.  Patient ID and intended procedure confirmed with present staff. Received instructions for my participation in the procedure from the performing physician.  

## 2015-02-04 NOTE — Progress Notes (Signed)
Report to PACU, RN, vss, BBS= Clear.  

## 2015-02-04 NOTE — Patient Instructions (Signed)
Discharge instructions given. Handout on polyps. Resume previous medications. YOU HAD AN ENDOSCOPIC PROCEDURE TODAY AT THE [facility]:   Refer to the procedure report that was given to you for any specific questions about what was found during the examination.  If the procedure report does not answer your questions, please call your gastroenterologist to clarify.  If you requested that your care partner not be given the details of your procedure findings, then the procedure report has been included in a sealed envelope for you to review at your convenience later.  YOU SHOULD EXPECT: Some feelings of bloating in the abdomen. Passage of more gas than usual.  Walking can help get rid of the air that was put into your GI tract during the procedure and reduce the bloating. If you had a lower endoscopy (such as a colonoscopy or flexible sigmoidoscopy) you may notice spotting of blood in your stool or on the toilet paper. If you underwent a bowel prep for your procedure, you may not have a normal bowel movement for a few days.  Please Note:  You might notice some irritation and congestion in your nose or some drainage.  This is from the oxygen used during your procedure.  There is no need for concern and it should clear up in a day or so.  SYMPTOMS TO REPORT IMMEDIATELY:   Following lower endoscopy (colonoscopy or flexible sigmoidoscopy):  Excessive amounts of blood in the stool  Significant tenderness or worsening of abdominal pains  Swelling of the abdomen that is new, acute  Fever of 100F or higher   For urgent or emergent issues, a gastroenterologist can be reached at any hour by calling [phone].   DIET: Your first meal following the procedure should be a small meal and then it is ok to progress to your normal diet. Heavy or fried foods are harder to digest and may make you feel nauseous or bloated.  Likewise, meals heavy in dairy and vegetables can increase bloating.  Drink  plenty of fluids but you should avoid alcoholic beverages for 24 hours.  ACTIVITY:  You should plan to take it easy for the rest of today and you should NOT DRIVE or use heavy machinery until tomorrow (because of the sedation medicines used during the test).    FOLLOW UP: Our staff will call the number listed on your records the next business day following your procedure to check on you and address any questions or concerns that you may have regarding the information given to you following your procedure. If we do not reach you, we will leave a message.  However, if you are feeling well and you are not experiencing any problems, there is no need to return our call.  We will assume that you have returned to your regular daily activities without incident.  If any biopsies were taken you will be contacted by phone or by letter within the next 1-3 weeks.  Please call us at [phone] if you have not heard about the biopsies in 3 weeks.    SIGNATURES/CONFIDENTIALITY: You and/or your care partner have signed paperwork which will be entered into your electronic medical record.  These signatures attest to the fact that that the information above on your After Visit Summary has been reviewed and is understood.  Full responsibility of the confidentiality of this discharge information lies with you and/or your care-partner. 

## 2015-02-05 ENCOUNTER — Telehealth: Payer: Self-pay | Admitting: *Deleted

## 2015-02-05 NOTE — Telephone Encounter (Signed)
  Follow up Call-  Call back number 02/04/2015  Post procedure Call Back phone  # [phone]  Permission to leave phone message Yes     Patient questions:  Do you have a fever, pain , or abdominal swelling? No. Pain Score  0 *  Have you tolerated food without any problems? Yes.    Have you been able to return to your normal activities? Yes.    Do you have any questions about your discharge instructions: Diet   No. Medications  No. Follow up visit  No.  Do you have questions or concerns about your Care? No.  Actions: * If pain score is 4 or above: No action needed, pain <4.

## 2015-02-15 ENCOUNTER — Encounter: Payer: Self-pay | Admitting: Gastroenterology

## 2015-03-31 LAB — HM DEXA SCAN

## 2015-05-20 ENCOUNTER — Other Ambulatory Visit (HOSPITAL_COMMUNITY): Payer: Self-pay | Admitting: Obstetrics & Gynecology

## 2015-05-20 ENCOUNTER — Ambulatory Visit (HOSPITAL_COMMUNITY)
Admission: RE | Admit: 2015-05-20 | Discharge: 2015-05-20 | Disposition: A | Discharge status: Home / Self Care | Payer: BLUE CROSS/BLUE SHIELD | Source: Ambulatory Visit | Attending: Obstetrics & Gynecology | Admitting: Obstetrics & Gynecology

## 2015-05-20 ENCOUNTER — Encounter (HOSPITAL_COMMUNITY): Payer: Self-pay

## 2015-05-20 DIAGNOSIS — Z029 Encounter for administrative examinations, unspecified: Secondary | ICD-10-CM | POA: Insufficient documentation

## 2015-05-20 HISTORY — DX: Age-related osteoporosis without current pathological fracture: M81.0

## 2015-05-20 MED ORDER — ZOLEDRONIC ACID 5 MG/100ML IV SOLN
5.0000 mg | Freq: Once | INTRAVENOUS | Status: AC
  Administered 2015-05-20: 5 mg via INTRAVENOUS
  Filled 2015-05-20: qty 100

## 2015-05-20 MED ORDER — SODIUM CHLORIDE 0.9 % IV SOLN
Freq: Once | INTRAVENOUS | Status: AC
  Administered 2015-05-20: 14:00:00 via INTRAVENOUS

## 2015-05-20 NOTE — Discharge Instructions (Signed)
Drink  Fluids/water as tolerated over the next 72 hours Tylenol or ibuprofen as needed for aches and pains  Continue Calcium and Vit D as directed by your MD   Reclast   Zoledronic Acid injection (Paget's Disease, Osteoporosis) What is this medicine? ZOLEDRONIC ACID (ZOE le dron ik AS id) lowers the amount of calcium loss from bone. It is used to treat Paget's disease and osteoporosis in women. This medicine may be used for other purposes; ask your health care provider or pharmacist if you have questions. What should I tell my health care provider before I take this medicine? They need to know if you have any of these conditions: -aspirin-sensitive asthma -cancer, especially if you are receiving medicines used to treat cancer -dental disease or wear dentures -infection -kidney disease -low levels of calcium in the blood -past surgery on the parathyroid gland or intestines -receiving corticosteroids like dexamethasone or prednisone -an unusual or allergic reaction to zoledronic acid, other medicines, foods, dyes, or preservatives -pregnant or trying to get pregnant -breast-feeding How should I use this medicine? This medicine is for infusion into a vein. It is given by a health care professional in a hospital or clinic setting. Talk to your pediatrician regarding the use of this medicine in children. This medicine is not approved for use in children. Overdosage: If you think you have taken too much of this medicine contact a poison control center or emergency room at once. NOTE: This medicine is only for you. Do not share this medicine with others. What if I miss a dose? It is important not to miss your dose. Call your doctor or health care professional if you are unable to keep an appointment. What may interact with this medicine? -certain antibiotics given by injection -NSAIDs, medicines for pain and inflammation, like ibuprofen or naproxen -some diuretics like bumetanide,  furosemide -teriparatide This list may not describe all possible interactions. Give your health care provider a list of all the medicines, herbs, non-prescription drugs, or dietary supplements you use. Also tell them if you smoke, drink alcohol, or use illegal drugs. Some items may interact with your medicine. What should I watch for while using this medicine? Visit your doctor or health care professional for regular checkups. It may be some time before you see the benefit from this medicine. Do not stop taking your medicine unless your doctor tells you to. Your doctor may order blood tests or other tests to see how you are doing. Women should inform their doctor if they wish to become pregnant or think they might be pregnant. There is a potential for serious side effects to an unborn child. Talk to your health care professional or pharmacist for more information. You should make sure that you get enough calcium and vitamin D while you are taking this medicine. Discuss the foods you eat and the vitamins you take with your health care professional. Some people who take this medicine have severe bone, joint, and/or muscle pain. This medicine may also increase your risk for jaw problems or a broken thigh bone. Tell your doctor right away if you have severe pain in your jaw, bones, joints, or muscles. Tell your doctor if you have any pain that does not go away or that gets worse. Tell your dentist and dental surgeon that you are taking this medicine. You should not have major dental surgery while on this medicine. See your dentist to have a dental exam and fix any dental problems before starting this medicine.  Take good care of your teeth while on this medicine. Make sure you see your dentist for regular follow-up appointments. What side effects may I notice from receiving this medicine? Side effects that you should report to your doctor or health care professional as soon as possible: -allergic reactions  like skin rash, itching or hives, swelling of the face, lips, or tongue -anxiety, confusion, or depression -breathing problems -changes in vision -eye pain -feeling faint or lightheaded, falls -jaw pain, especially after dental work -mouth sores -muscle cramps, stiffness, or weakness -redness, blistering, peeling or loosening of the skin, including inside the mouth -trouble passing urine or change in the amount of urine Side effects that usually do not require medical attention (report to your doctor or health care professional if they continue or are bothersome): -bone, joint, or muscle pain -constipation -diarrhea -fever -hair loss -irritation at site where injected -loss of appetite -nausea, vomiting -stomach upset -trouble sleeping -trouble swallowing -weak or tired This list may not describe all possible side effects. Call your doctor for medical advice about side effects. You may report side effects to FDA at 1-800-FDA-1088. Where should I keep my medicine? This drug is given in a hospital or clinic and will not be stored at home. NOTE: This sheet is a summary. It may not cover all possible information. If you have questions about this medicine, talk to your doctor, pharmacist, or health care provider.    2016, [employer]. (2013-06-07 14:19:57)

## 2015-07-14 ENCOUNTER — Telehealth: Payer: Self-pay

## 2015-07-14 DIAGNOSIS — I10 Essential (primary) hypertension: Secondary | ICD-10-CM

## 2015-07-14 MED ORDER — VALSARTAN 160 MG PO TABS: 160.0000 mg | ORAL_TABLET | Freq: Every day | ORAL | Status: DC

## 2015-07-14 NOTE — Telephone Encounter (Signed)
Pt due for CPE requesting med rfs. Sent 90 day supply until visit

## 2015-07-15 NOTE — Telephone Encounter (Signed)
Left patient vm to schedule appt °

## 2015-08-10 ENCOUNTER — Other Ambulatory Visit (INDEPENDENT_AMBULATORY_CARE_PROVIDER_SITE_OTHER): Payer: BLUE CROSS/BLUE SHIELD

## 2015-08-10 ENCOUNTER — Encounter: Payer: Self-pay | Admitting: Internal Medicine

## 2015-08-10 ENCOUNTER — Ambulatory Visit (INDEPENDENT_AMBULATORY_CARE_PROVIDER_SITE_OTHER): Payer: BLUE CROSS/BLUE SHIELD | Admitting: Internal Medicine

## 2015-08-10 VITALS — BP 126/84 | HR 83 | Temp 98.2°F | Resp 16 | Wt 159.0 lb

## 2015-08-10 DIAGNOSIS — I10 Essential (primary) hypertension: Secondary | ICD-10-CM

## 2015-08-10 DIAGNOSIS — Z0000 Encounter for general adult medical examination without abnormal findings: Secondary | ICD-10-CM

## 2015-08-10 DIAGNOSIS — M810 Age-related osteoporosis without current pathological fracture: Secondary | ICD-10-CM

## 2015-08-10 DIAGNOSIS — E785 Hyperlipidemia, unspecified: Secondary | ICD-10-CM

## 2015-08-10 DIAGNOSIS — Z1159 Encounter for screening for other viral diseases: Secondary | ICD-10-CM

## 2015-08-10 DIAGNOSIS — R739 Hyperglycemia, unspecified: Secondary | ICD-10-CM

## 2015-08-10 DIAGNOSIS — M8580 Other specified disorders of bone density and structure, unspecified site: Secondary | ICD-10-CM

## 2015-08-10 DIAGNOSIS — E2839 Other primary ovarian failure: Secondary | ICD-10-CM

## 2015-08-10 LAB — VITAMIN D 25 HYDROXY (VIT D DEFICIENCY, FRACTURES): VITD: 34.15 ng/mL (ref 30.00–100.00)

## 2015-08-10 LAB — COMPREHENSIVE METABOLIC PANEL
ALT: 15 U/L (ref 0–35)
AST: 17 U/L (ref 0–37)
Albumin: 4.6 g/dL (ref 3.5–5.2)
Alkaline Phosphatase: 61 U/L (ref 39–117)
BILIRUBIN TOTAL: 0.7 mg/dL (ref 0.2–1.2)
BUN: 10 mg/dL (ref 6–23)
CO2: 29 meq/L (ref 19–32)
CREATININE: 0.73 mg/dL (ref 0.40–1.20)
Calcium: 10.7 mg/dL — ABNORMAL HIGH (ref 8.4–10.5)
Chloride: 100 mEq/L (ref 96–112)
GFR: 83.88 mL/min (ref >60.00)
GLUCOSE: 95 mg/dL (ref 70–99)
Potassium: 4.7 mEq/L (ref 3.5–5.1)
SODIUM: 137 meq/L (ref 135–145)
Total Protein: 7.4 g/dL (ref 6.0–8.3)

## 2015-08-10 LAB — HEMOGLOBIN A1C: Hgb A1c MFr Bld: 5 % (ref 4.6–6.5)

## 2015-08-10 LAB — TSH: TSH: 0.69 u[IU]/mL (ref 0.35–4.50)

## 2015-08-10 LAB — LIPID PANEL
CHOL/HDL RATIO: 4
Cholesterol: 237 mg/dL — ABNORMAL HIGH (ref 0–200)
HDL: 63 mg/dL (ref >39.00)
LDL Cholesterol: 156 mg/dL — ABNORMAL HIGH (ref 0–99)
NONHDL: 173.57
TRIGLYCERIDES: 87 mg/dL (ref 0.0–149.0)
VLDL: 17.4 mg/dL (ref 0.0–40.0)

## 2015-08-10 NOTE — Assessment & Plan Note (Signed)

## 2015-08-10 NOTE — Progress Notes (Signed)
Pre visit review using our clinic review tool, if applicable. No additional management support is needed unless otherwise documented below in the visit note. 

## 2015-08-10 NOTE — Patient Instructions (Signed)
Preventive Care for Adults, Female A healthy lifestyle and preventive care can promote health and wellness. Preventive health guidelines for women include the following key practices.  A routine yearly physical is a good way to check with your health care provider about your health and preventive screening. It is a chance to share any concerns and updates on your health and to receive a thorough exam.  Visit your dentist for a routine exam and preventive care every 6 months. Brush your teeth twice a day and floss once a day. Good oral hygiene prevents tooth decay and gum disease.  The frequency of eye exams is based on your age, health, family medical history, use of contact lenses, and other factors. Follow your health care provider's recommendations for frequency of eye exams.  Eat a healthy diet. Foods like vegetables, fruits, whole grains, low-fat dairy products, and lean protein foods contain the nutrients you need without too many calories. Decrease your intake of foods high in solid fats, added sugars, and salt. Eat the right amount of calories for you.Get information about a proper diet from your health care provider, if necessary.  Regular physical exercise is one of the most important things you can do for your health. Most adults should get at least 150 minutes of moderate-intensity exercise (any activity that increases your heart rate and causes you to sweat) each week. In addition, most adults need muscle-strengthening exercises on 2 or more days a week.  Maintain a healthy weight. The body mass index (BMI) is a screening tool to identify possible weight problems. It provides an estimate of body fat based on height and weight. Your health care provider can find your BMI and can help you achieve or maintain a healthy weight.For adults 20 years and older:  A BMI below 18.5 is considered underweight.  A BMI of 18.5 to 24.9 is normal.  A BMI of 25 to 29.9 is considered overweight.  A  BMI of 30 and above is considered obese.  Maintain normal blood lipids and cholesterol levels by exercising and minimizing your intake of saturated fat. Eat a balanced diet with plenty of fruit and vegetables. Blood tests for lipids and cholesterol should begin at age 45 and be repeated every 5 years. If your lipid or cholesterol levels are high, you are over 50, or you are at high risk for heart disease, you may need your cholesterol levels checked more frequently.Ongoing high lipid and cholesterol levels should be treated with medicines if diet and exercise are not working.  If you smoke, find out from your health care provider how to quit. If you do not use tobacco, do not start.  Lung cancer screening is recommended for adults aged 45-80 years who are at high risk for developing lung cancer because of a history of smoking. A yearly low-dose CT scan of the lungs is recommended for people who have at least a 30-pack-year history of smoking and are a current smoker or have quit within the past 15 years. A pack year of smoking is smoking an average of 1 pack of cigarettes a day for 1 year (for example: 1 pack a day for 30 years or 2 packs a day for 15 years). Yearly screening should continue until the smoker has stopped smoking for at least 15 years. Yearly screening should be stopped for people who develop a health problem that would prevent them from having lung cancer treatment.  If you are pregnant, do not drink alcohol. If you are  breastfeeding, be very cautious about drinking alcohol. If you are not pregnant and choose to drink alcohol, do not have more than 1 drink per day. One drink is considered to be 12 ounces (355 mL) of beer, 5 ounces (148 mL) of wine, or 1.5 ounces (44 mL) of liquor.  Avoid use of street drugs. Do not share needles with anyone. Ask for help if you need support or instructions about stopping the use of drugs.  High blood pressure causes heart disease and increases the risk  of stroke. Your blood pressure should be checked at least every 1 to 2 years. Ongoing high blood pressure should be treated with medicines if weight loss and exercise do not work.  If you are 55-79 years old, ask your health care provider if you should take aspirin to prevent strokes.  Diabetes screening is done by taking a blood sample to check your blood glucose level after you have not eaten for a certain period of time (fasting). If you are not overweight and you do not have risk factors for diabetes, you should be screened once every 3 years starting at age 45. If you are overweight or obese and you are 40-70 years of age, you should be screened for diabetes every year as part of your cardiovascular risk assessment.  Breast cancer screening is essential preventive care for women. You should practice "breast self-awareness." This means understanding the normal appearance and feel of your breasts and may include breast self-examination. Any changes detected, no matter how small, should be reported to a health care provider. Women in their 20s and 30s should have a clinical breast exam (CBE) by a health care provider as part of a regular health exam every 1 to 3 years. After age 40, women should have a CBE every year. Starting at age 40, women should consider having a mammogram (breast X-ray test) every year. Women who have a family history of breast cancer should talk to their health care provider about genetic screening. Women at a high risk of breast cancer should talk to their health care providers about having an MRI and a mammogram every year.  Breast cancer gene (BRCA)-related cancer risk assessment is recommended for women who have family members with BRCA-related cancers. BRCA-related cancers include breast, ovarian, tubal, and peritoneal cancers. Having family members with these cancers may be associated with an increased risk for harmful changes (mutations) in the breast cancer genes BRCA1 and  BRCA2. Results of the assessment will determine the need for genetic counseling and BRCA1 and BRCA2 testing.  Your health care provider may recommend that you be screened regularly for cancer of the pelvic organs (ovaries, uterus, and vagina). This screening involves a pelvic examination, including checking for microscopic changes to the surface of your cervix (Pap test). You may be encouraged to have this screening done every 3 years, beginning at age 21.  For women ages 30-65, health care providers may recommend pelvic exams and Pap testing every 3 years, or they may recommend the Pap and pelvic exam, combined with testing for human papilloma virus (HPV), every 5 years. Some types of HPV increase your risk of cervical cancer. Testing for HPV may also be done on women of any age with unclear Pap test results.  Other health care providers may not recommend any screening for nonpregnant women who are considered low risk for pelvic cancer and who do not have symptoms. Ask your health care provider if a screening pelvic exam is right for   you.  If you have had past treatment for cervical cancer or a condition that could lead to cancer, you need Pap tests and screening for cancer for at least 20 years after your treatment. If Pap tests have been discontinued, your risk factors (such as having a new sexual partner) need to be reassessed to determine if screening should resume. Some women have medical problems that increase the chance of getting cervical cancer. In these cases, your health care provider may recommend more frequent screening and Pap tests.  Colorectal cancer can be detected and often prevented. Most routine colorectal cancer screening begins at the age of 50 years and continues through age 75 years. However, your health care provider may recommend screening at an earlier age if you have risk factors for colon cancer. On a yearly basis, your health care provider may provide home test kits to check  for hidden blood in the stool. Use of a small camera at the end of a tube, to directly examine the colon (sigmoidoscopy or colonoscopy), can detect the earliest forms of colorectal cancer. Talk to your health care provider about this at age 50, when routine screening begins. Direct exam of the colon should be repeated every 5-10 years through age 75 years, unless early forms of precancerous polyps or small growths are found.  People who are at an increased risk for hepatitis B should be screened for this virus. You are considered at high risk for hepatitis B if:  You were born in a country where hepatitis B occurs often. Talk with your health care provider about which countries are considered high risk.  Your parents were born in a high-risk country and you have not received a shot to protect against hepatitis B (hepatitis B vaccine).  You have HIV or AIDS.  You use needles to inject street drugs.  You live with, or have sex with, someone who has hepatitis B.  You get hemodialysis treatment.  You take certain medicines for conditions like cancer, organ transplantation, and autoimmune conditions.  Hepatitis C blood testing is recommended for all people born from 1945 through 1965 and any individual with known risks for hepatitis C.  Practice safe sex. Use condoms and avoid high-risk sexual practices to reduce the spread of sexually transmitted infections (STIs). STIs include gonorrhea, chlamydia, syphilis, trichomonas, herpes, HPV, and human immunodeficiency virus (HIV). Herpes, HIV, and HPV are viral illnesses that have no cure. They can result in disability, cancer, and death.  You should be screened for sexually transmitted illnesses (STIs) including gonorrhea and chlamydia if:  You are sexually active and are younger than 24 years.  You are older than 24 years and your health care provider tells you that you are at risk for this type of infection.  Your sexual activity has changed  since you were last screened and you are at an increased risk for chlamydia or gonorrhea. Ask your health care provider if you are at risk.  If you are at risk of being infected with HIV, it is recommended that you take a prescription medicine daily to prevent HIV infection. This is called preexposure prophylaxis (PrEP). You are considered at risk if:  You are sexually active and do not regularly use condoms or know the HIV status of your partner(s).  You take drugs by injection.  You are sexually active with a partner who has HIV.  Talk with your health care provider about whether you are at high risk of being infected with HIV. If   you choose to begin PrEP, you should first be tested for HIV. You should then be tested every 3 months for as long as you are taking PrEP.  Osteoporosis is a disease in which the bones lose minerals and strength with aging. This can result in serious bone fractures or breaks. The risk of osteoporosis can be identified using a bone density scan. Women ages 67 years and over and women at risk for fractures or osteoporosis should discuss screening with their health care providers. Ask your health care provider whether you should take a calcium supplement or vitamin D to reduce the rate of osteoporosis.  Menopause can be associated with physical symptoms and risks. Hormone replacement therapy is available to decrease symptoms and risks. You should talk to your health care provider about whether hormone replacement therapy is right for you.  Use sunscreen. Apply sunscreen liberally and repeatedly throughout the day. You should seek shade when your shadow is shorter than you. Protect yourself by wearing long sleeves, pants, a wide-brimmed hat, and sunglasses year round, whenever you are outdoors.  Once a month, do a whole body skin exam, using a mirror to look at the skin on your back. Tell your health care provider of new moles, moles that have irregular borders, moles that  are larger than a pencil eraser, or moles that have changed in shape or color.  Stay current with required vaccines (immunizations).  Influenza vaccine. All adults should be immunized every year.  Tetanus, diphtheria, and acellular pertussis (Td, Tdap) vaccine. Pregnant women should receive 1 dose of Tdap vaccine during each pregnancy. The dose should be obtained regardless of the length of time since the last dose. Immunization is preferred during the 27th-36th week of gestation. An adult who has not previously received Tdap or who does not know her vaccine status should receive 1 dose of Tdap. This initial dose should be followed by tetanus and diphtheria toxoids (Td) booster doses every 10 years. Adults with an unknown or incomplete history of completing a 3-dose immunization series with Td-containing vaccines should begin or complete a primary immunization series including a Tdap dose. Adults should receive a Td booster every 10 years.  Varicella vaccine. An adult without evidence of immunity to varicella should receive 2 doses or a second dose if she has previously received 1 dose. Pregnant females who do not have evidence of immunity should receive the first dose after pregnancy. This first dose should be obtained before leaving the health care facility. The second dose should be obtained 4-8 weeks after the first dose.  Human papillomavirus (HPV) vaccine. Females aged 13-26 years who have not received the vaccine previously should obtain the 3-dose series. The vaccine is not recommended for use in pregnant females. However, pregnancy testing is not needed before receiving a dose. If a female is found to be pregnant after receiving a dose, no treatment is needed. In that case, the remaining doses should be delayed until after the pregnancy. Immunization is recommended for any person with an immunocompromised condition through the age of 61 years if she did not get any or all doses earlier. During the  3-dose series, the second dose should be obtained 4-8 weeks after the first dose. The third dose should be obtained 24 weeks after the first dose and 16 weeks after the second dose.  Zoster vaccine. One dose is recommended for adults aged 30 years or older unless certain conditions are present.  Measles, mumps, and rubella (MMR) vaccine. Adults born  before 1957 generally are considered immune to measles and mumps. Adults born in 1957 or later should have 1 or more doses of MMR vaccine unless there is a contraindication to the vaccine or there is laboratory evidence of immunity to each of the three diseases. A routine second dose of MMR vaccine should be obtained at least 28 days after the first dose for students attending postsecondary schools, health care workers, or international travelers. People who received inactivated measles vaccine or an unknown type of measles vaccine during 1963-1967 should receive 2 doses of MMR vaccine. People who received inactivated mumps vaccine or an unknown type of mumps vaccine before 1979 and are at high risk for mumps infection should consider immunization with 2 doses of MMR vaccine. For females of childbearing age, rubella immunity should be determined. If there is no evidence of immunity, females who are not pregnant should be vaccinated. If there is no evidence of immunity, females who are pregnant should delay immunization until after pregnancy. Unvaccinated health care workers born before 1957 who lack laboratory evidence of measles, mumps, or rubella immunity or laboratory confirmation of disease should consider measles and mumps immunization with 2 doses of MMR vaccine or rubella immunization with 1 dose of MMR vaccine.  Pneumococcal 13-valent conjugate (PCV13) vaccine. When indicated, a person who is uncertain of his immunization history and has no record of immunization should receive the PCV13 vaccine. All adults 65 years of age and older should receive this  vaccine. An adult aged 19 years or older who has certain medical conditions and has not been previously immunized should receive 1 dose of PCV13 vaccine. This PCV13 should be followed with a dose of pneumococcal polysaccharide (PPSV23) vaccine. Adults who are at high risk for pneumococcal disease should obtain the PPSV23 vaccine at least 8 weeks after the dose of PCV13 vaccine. Adults older than 70 years of age who have normal immune system function should obtain the PPSV23 vaccine dose at least 1 year after the dose of PCV13 vaccine.  Pneumococcal polysaccharide (PPSV23) vaccine. When PCV13 is also indicated, PCV13 should be obtained first. All adults aged 65 years and older should be immunized. An adult younger than age 65 years who has certain medical conditions should be immunized. Any person who resides in a nursing home or long-term care facility should be immunized. An adult smoker should be immunized. People with an immunocompromised condition and certain other conditions should receive both PCV13 and PPSV23 vaccines. People with human immunodeficiency virus (HIV) infection should be immunized as soon as possible after diagnosis. Immunization during chemotherapy or radiation therapy should be avoided. Routine use of PPSV23 vaccine is not recommended for American Indians, [place] Natives, or people younger than 65 years unless there are medical conditions that require PPSV23 vaccine. When indicated, people who have unknown immunization and have no record of immunization should receive PPSV23 vaccine. One-time revaccination 5 years after the first dose of PPSV23 is recommended for people aged 19-64 years who have chronic kidney failure, nephrotic syndrome, asplenia, or immunocompromised conditions. People who received 1-2 doses of PPSV23 before age 65 years should receive another dose of PPSV23 vaccine at age 65 years or later if at least 5 years have passed since the previous dose. Doses of PPSV23 are not  needed for people immunized with PPSV23 at or after age 65 years.  Meningococcal vaccine. Adults with asplenia or persistent complement component deficiencies should receive 2 doses of quadrivalent meningococcal conjugate (MenACWY-D) vaccine. The doses should be obtained   at least 2 months apart. Microbiologists working with certain meningococcal bacteria, [place] recruits, people at risk during an outbreak, and people who travel to or live in countries with a high rate of meningitis should be immunized. A first-year college student up through age 34 years who is living in a residence hall should receive a dose if she did not receive a dose on or after her 16th birthday. Adults who have certain high-risk conditions should receive one or more doses of vaccine.  Hepatitis A vaccine. Adults who wish to be protected from this disease, have certain high-risk conditions, work with hepatitis A-infected animals, work in hepatitis A research labs, or travel to or work in countries with a high rate of hepatitis A should be immunized. Adults who were previously unvaccinated and who anticipate close contact with an international adoptee during the first 60 days after arrival in the [place] from a country with a high rate of hepatitis A should be immunized.  Hepatitis B vaccine. Adults who wish to be protected from this disease, have certain high-risk conditions, may be exposed to blood or other infectious body fluids, are household contacts or sex partners of hepatitis B positive people, are clients or workers in certain care facilities, or travel to or work in countries with a high rate of hepatitis B should be immunized.  Haemophilus influenzae type b (Hib) vaccine. A previously unvaccinated person with asplenia or sickle cell disease or having a scheduled splenectomy should receive 1 dose of Hib vaccine. Regardless of previous immunization, a recipient of a hematopoietic stem cell transplant should receive a  3-dose series 6-12 months after her successful transplant. Hib vaccine is not recommended for adults with HIV infection. Preventive Services / Frequency Ages 35 to 4 years  Blood pressure check.** / Every 3-5 years.  Lipid and cholesterol check.** / Every 5 years beginning at age 60.  Clinical breast exam.** / Every 3 years for women in their 71s and 10s.  BRCA-related cancer risk assessment.** / For women who have family members with a BRCA-related cancer (breast, ovarian, tubal, or peritoneal cancers).  Pap test.** / Every 2 years from ages 76 through 26. Every 3 years starting at age 61 through age 76 or 93 with a history of 3 consecutive normal Pap tests.  HPV screening.** / Every 3 years from ages 37 through ages 60 to 51 with a history of 3 consecutive normal Pap tests.  Hepatitis C blood test.** / For any individual with known risks for hepatitis C.  Skin self-exam. / Monthly.  Influenza vaccine. / Every year.  Tetanus, diphtheria, and acellular pertussis (Tdap, Td) vaccine.** / Consult your health care provider. Pregnant women should receive 1 dose of Tdap vaccine during each pregnancy. 1 dose of Td every 10 years.  Varicella vaccine.** / Consult your health care provider. Pregnant females who do not have evidence of immunity should receive the first dose after pregnancy.  HPV vaccine. / 3 doses over 6 months, if 93 and younger. The vaccine is not recommended for use in pregnant females. However, pregnancy testing is not needed before receiving a dose.  Measles, mumps, rubella (MMR) vaccine.** / You need at least 1 dose of MMR if you were born in 1957 or later. You may also need a 2nd dose. For females of childbearing age, rubella immunity should be determined. If there is no evidence of immunity, females who are not pregnant should be vaccinated. If there is no evidence of immunity, females who are  pregnant should delay immunization until after pregnancy.  Pneumococcal  13-valent conjugate (PCV13) vaccine.** / Consult your health care provider.  Pneumococcal polysaccharide (PPSV23) vaccine.** / 1 to 2 doses if you smoke cigarettes or if you have certain conditions.  Meningococcal vaccine.** / 1 dose if you are age 68 to 8 years and a Market researcher living in a residence hall, or have one of several medical conditions, you need to get vaccinated against meningococcal disease. You may also need additional booster doses.  Hepatitis A vaccine.** / Consult your health care provider.  Hepatitis B vaccine.** / Consult your health care provider.  Haemophilus influenzae type b (Hib) vaccine.** / Consult your health care provider. Ages 7 to 53 years  Blood pressure check.** / Every year.  Lipid and cholesterol check.** / Every 5 years beginning at age 25 years.  Lung cancer screening. / Every year if you are aged 11-80 years and have a 30-pack-year history of smoking and currently smoke or have quit within the past 15 years. Yearly screening is stopped once you have quit smoking for at least 15 years or develop a health problem that would prevent you from having lung cancer treatment.  Clinical breast exam.** / Every year after age 48 years.  BRCA-related cancer risk assessment.** / For women who have family members with a BRCA-related cancer (breast, ovarian, tubal, or peritoneal cancers).  Mammogram.** / Every year beginning at age 41 years and continuing for as long as you are in good health. Consult with your health care provider.  Pap test.** / Every 3 years starting at age 65 years through age 37 or 70 years with a history of 3 consecutive normal Pap tests.  HPV screening.** / Every 3 years from ages 72 years through ages 60 to 40 years with a history of 3 consecutive normal Pap tests.  Fecal occult blood test (FOBT) of stool. / Every year beginning at age 21 years and continuing until age 5 years. You may not need to do this test if you get  a colonoscopy every 10 years.  Flexible sigmoidoscopy or colonoscopy.** / Every 5 years for a flexible sigmoidoscopy or every 10 years for a colonoscopy beginning at age 35 years and continuing until age 48 years.  Hepatitis C blood test.** / For all people born from [birth year] through 1965 and any individual with known risks for hepatitis C.  Skin self-exam. / Monthly.  Influenza vaccine. / Every year.  Tetanus, diphtheria, and acellular pertussis (Tdap/Td) vaccine.** / Consult your health care provider. Pregnant women should receive 1 dose of Tdap vaccine during each pregnancy. 1 dose of Td every 10 years.  Varicella vaccine.** / Consult your health care provider. Pregnant females who do not have evidence of immunity should receive the first dose after pregnancy.  Zoster vaccine.** / 1 dose for adults aged 30 years or older.  Measles, mumps, rubella (MMR) vaccine.** / You need at least 1 dose of MMR if you were born in 1957 or later. You may also need a second dose. For females of childbearing age, rubella immunity should be determined. If there is no evidence of immunity, females who are not pregnant should be vaccinated. If there is no evidence of immunity, females who are pregnant should delay immunization until after pregnancy.  Pneumococcal 13-valent conjugate (PCV13) vaccine.** / Consult your health care provider.  Pneumococcal polysaccharide (PPSV23) vaccine.** / 1 to 2 doses if you smoke cigarettes or if you have certain conditions.  Meningococcal vaccine.** /  Consult your health care provider.  Hepatitis A vaccine.** / Consult your health care provider.  Hepatitis B vaccine.** / Consult your health care provider.  Haemophilus influenzae type b (Hib) vaccine.** / Consult your health care provider. Ages 64 years and over  Blood pressure check.** / Every year.  Lipid and cholesterol check.** / Every 5 years beginning at age 23 years.  Lung cancer screening. / Every year if you  are aged 16-80 years and have a 30-pack-year history of smoking and currently smoke or have quit within the past 15 years. Yearly screening is stopped once you have quit smoking for at least 15 years or develop a health problem that would prevent you from having lung cancer treatment.  Clinical breast exam.** / Every year after age 74 years.  BRCA-related cancer risk assessment.** / For women who have family members with a BRCA-related cancer (breast, ovarian, tubal, or peritoneal cancers).  Mammogram.** / Every year beginning at age 44 years and continuing for as long as you are in good health. Consult with your health care provider.  Pap test.** / Every 3 years starting at age 58 years through age 22 or 39 years with 3 consecutive normal Pap tests. Testing can be stopped between 65 and 70 years with 3 consecutive normal Pap tests and no abnormal Pap or HPV tests in the past 10 years.  HPV screening.** / Every 3 years from ages 64 years through ages 70 or 61 years with a history of 3 consecutive normal Pap tests. Testing can be stopped between 65 and 70 years with 3 consecutive normal Pap tests and no abnormal Pap or HPV tests in the past 10 years.  Fecal occult blood test (FOBT) of stool. / Every year beginning at age 40 years and continuing until age 27 years. You may not need to do this test if you get a colonoscopy every 10 years.  Flexible sigmoidoscopy or colonoscopy.** / Every 5 years for a flexible sigmoidoscopy or every 10 years for a colonoscopy beginning at age 7 years and continuing until age 32 years.  Hepatitis C blood test.** / For all people born from [birth year] through 1965 and any individual with known risks for hepatitis C.  Osteoporosis screening.** / A one-time screening for women ages 30 years and over and women at risk for fractures or osteoporosis.  Skin self-exam. / Monthly.  Influenza vaccine. / Every year.  Tetanus, diphtheria, and acellular pertussis (Tdap/Td)  vaccine.** / 1 dose of Td every 10 years.  Varicella vaccine.** / Consult your health care provider.  Zoster vaccine.** / 1 dose for adults aged 35 years or older.  Pneumococcal 13-valent conjugate (PCV13) vaccine.** / Consult your health care provider.  Pneumococcal polysaccharide (PPSV23) vaccine.** / 1 dose for all adults aged 46 years and older.  Meningococcal vaccine.** / Consult your health care provider.  Hepatitis A vaccine.** / Consult your health care provider.  Hepatitis B vaccine.** / Consult your health care provider.  Haemophilus influenzae type b (Hib) vaccine.** / Consult your health care provider. ** Family history and personal history of risk and conditions may change your health care provider's recommendations.   This information is not intended to replace advice given to you by your health care provider. Make sure you discuss any questions you have with your health care provider.   Document Released: 03/07/2001 Document Revised: 01/30/2014 Document Reviewed: 06/06/2010 Elsevier Interactive Patient Education [employer].

## 2015-08-10 NOTE — Progress Notes (Signed)
Subjective:  Patient ID: Vanessa Fuentes, female    DOB: 12/28/1945  Age: 70 y.o. MRN: [id number]  CC: Annual Exam and Hypertension   HPI LAREN ORAMA presents for a CPX.  She tells me her blood pressure has been well controlled on the ARB. She has had no episodes of headache/blurred vision/chest pain/shortness of breath/edema/fatigue/or palpitations.  She has osteoporosis that is being treated by her GYN with an annual Reclast injection. She complains of arthralgias since receiving the Reclast injection.    Past Medical History  Diagnosis Date  . Spinal stenosis in cervical region   . Cervicalgia   . Brachial neuritis or radiculitis NOS   . Unspecified essential hypertension   . Degeneration of lumbar or lumbosacral intervertebral disc   . Degeneration of cervical intervertebral disc   . Dysfunction of eustachian tube   . Osteoporosis 2017   Past Surgical History  Procedure Laterality Date  . Tubal ligation    . Cervical discectomy  07/2009    C3-4 with fusion- Dr Janine Ores  . Nasal septum surgery  01/2009    [facility]  . Colonoscopy      polypectomy  . Spine surgery      reports that she quit smoking about 34 years ago. She has never used smokeless tobacco. She reports that she drinks about 1.0 oz of alcohol per week. She reports that she does not use illicit drugs. family history includes Cancer in her maternal aunt and mother; Esophageal cancer in her brother; Heart disease in her brother, father, and mother; Hypertension in her father and mother. There is no history of Diabetes, Colon cancer, Stomach cancer, or COPD. Allergies  Allergen Reactions  . Sulfonamide Derivatives     Blisters eyes    Outpatient Prescriptions Prior to Visit  Medication Sig Dispense Refill  . acetaminophen (TYLENOL) 650 MG CR tablet Take 650 mg by mouth every 8 (eight) hours as needed for pain.    . calcium carbonate (OS-CAL) 1250 (500 Ca) MG chewable tablet Chew 1 tablet by mouth  daily.    . cholecalciferol (VITAMIN D) 1000 units tablet Take 2,000 Units by mouth daily.    . valsartan (DIOVAN) 160 MG tablet Take 1 tablet (160 mg total) by mouth daily. 90 tablet 0  . diphenoxylate-atropine (LOMOTIL) 2.5-0.025 MG per tablet Take one po TID prn diarrhea (Patient not taking: Reported on 02/04/2015) 30 tablet 2  . SUPREP BOWEL PREP SOLN Take 1 kit by mouth once. 354 mL 0   No facility-administered medications prior to visit.    ROS Review of Systems  Constitutional: Negative.  Negative for fever, diaphoresis, activity change, appetite change, fatigue and unexpected weight change.  HENT: Negative.   Eyes: Negative.  Negative for visual disturbance.  Respiratory: Negative.  Negative for cough, choking, chest tightness, shortness of breath and stridor.   Cardiovascular: Negative.  Negative for chest pain, palpitations and leg swelling.  Gastrointestinal: Negative.  Negative for nausea, vomiting, abdominal pain, diarrhea and constipation.  Endocrine: Negative.   Genitourinary: Negative.   Musculoskeletal: Positive for arthralgias. Negative for myalgias and back pain.  Skin: Negative.   Allergic/Immunologic: Negative.   Neurological: Negative.  Negative for dizziness, tremors, facial asymmetry, weakness, numbness and headaches.  Hematological: Negative.  Negative for adenopathy. Does not bruise/bleed easily.  Psychiatric/Behavioral: Negative.     Objective:  BP 126/84 mmHg  Pulse 83  Temp(Src) 98.2 F (36.8 C) (Oral)  Resp 16  Wt 159 lb (72.122 kg)  SpO2  98%  BP Readings from Last 3 Encounters:  08/10/15 126/84  05/20/15 114/72  02/04/15 109/72    Wt Readings from Last 3 Encounters:  08/10/15 159 lb (72.122 kg)  05/20/15 152 lb 2 oz (69.003 kg)  02/04/15 159 lb (72.122 kg)    Physical Exam  Constitutional: She is oriented to person, place, and time. No distress.  HENT:  Mouth/Throat: Oropharynx is clear and moist. No oropharyngeal exudate.  Eyes:  Conjunctivae are normal. Right eye exhibits no discharge. Left eye exhibits no discharge. No scleral icterus.  Neck: Normal range of motion. Neck supple. No JVD present. No tracheal deviation present. No thyromegaly present.  Cardiovascular: Normal rate, regular rhythm, normal heart sounds and intact distal pulses.  Exam reveals no gallop and no friction rub.   No murmur heard. Pulmonary/Chest: Effort normal and breath sounds normal. No stridor. No respiratory distress. She has no wheezes. She has no rales. She exhibits no tenderness.  Abdominal: Soft. Bowel sounds are normal. She exhibits no distension and no mass. There is no tenderness. There is no rebound and no guarding.  Musculoskeletal: Normal range of motion. She exhibits no edema or tenderness.  Lymphadenopathy:    She has no cervical adenopathy.  Neurological: She is oriented to person, place, and time.  Skin: Skin is warm and dry. No rash noted. She is not diaphoretic. No erythema. No pallor.  Vitals reviewed.   Lab Results  Component Value Date   WBC 7.6 08/10/2015   HGB 14.7 08/10/2015   HCT 43.8 08/10/2015   PLT 200.0 08/10/2015   GLUCOSE 95 08/10/2015   CHOL 237* 08/10/2015   TRIG 87.0 08/10/2015   HDL 63.00 08/10/2015   LDLDIRECT 148.3 04/13/2011   LDLCALC 156* 08/10/2015   ALT 15 08/10/2015   AST 17 08/10/2015   NA 137 08/10/2015   K 4.7 08/10/2015   CL 100 08/10/2015   CREATININE 0.73 08/10/2015   BUN 10 08/10/2015   CO2 29 08/10/2015   TSH 0.69 08/10/2015   HGBA1C 5.0 08/10/2015    No results found.  Assessment & Plan:   Genetta was seen today for annual exam and hypertension.  Diagnoses and all orders for this visit:  Hyperglycemia- improvement noted -     Comprehensive metabolic panel; Future -     Hemoglobin A1c; Future  Hyperlipidemia with target LDL less than 130- her Framingham risk was only 5% so I do not recommend that she start taking a statin. -     Lipid panel; Future -      Comprehensive metabolic panel; Future -     TSH; Future  Essential hypertension- her blood pressures adequately well-controlled, electrolytes and renal function are normal. -     Comprehensive metabolic panel; Future -     CBC with Differential/Platelet; Future  Estrogen deficiency -     Cancel: DG Bone Density; Future  Osteopenia -     VITAMIN D 25 Hydroxy (Vit-D Deficiency, Fractures); Future -     Cancel: DG Bone Density; Future  Need for hepatitis C screening test -     Hepatitis C antibody; Future  Osteoporosis- continue annual Reclast injections she was reassured regarding the side effects.  Preventative health care  Hypercalcemia- she has a mildly elevated calcium level, vascular return in 4-6 weeks to recheck this and to screen her for hyperparathyroidism. Her vitamin D level is in the low/normal range.   I have discontinued Ms. Neece's diphenoxylate-atropine and SUPREP BOWEL PREP KIT. I am also  having her maintain her cholecalciferol, calcium carbonate, acetaminophen, and valsartan.  No orders of the defined types were placed in this encounter.   See AVS for instructions about healthy living and anticipatory guidance.  Follow-up: Return in about 6 months (around 02/10/2016).  Scarlette Calico, MD

## 2015-08-11 ENCOUNTER — Encounter: Payer: Self-pay | Admitting: Internal Medicine

## 2015-08-11 LAB — CBC WITH DIFFERENTIAL/PLATELET
BASOS ABS: 0 10*3/uL (ref 0.0–0.1)
Basophils Relative: 0.2 % (ref 0.0–3.0)
EOS ABS: 0.1 10*3/uL (ref 0.0–0.7)
Eosinophils Relative: 1.8 % (ref 0.0–5.0)
HCT: 43.8 % (ref 36.0–46.0)
Hemoglobin: 14.7 g/dL (ref 12.0–15.0)
LYMPHS ABS: 1.5 10*3/uL (ref 0.7–4.0)
Lymphocytes Relative: 20.4 % (ref 12.0–46.0)
MCHC: 33.6 g/dL (ref 30.0–36.0)
MCV: 88.8 fl (ref 78.0–100.0)
Monocytes Absolute: 0.1 10*3/uL (ref 0.1–1.0)
Monocytes Relative: 1.3 % — ABNORMAL LOW (ref 3.0–12.0)
NEUTROS ABS: 5.8 10*3/uL (ref 1.4–7.7)
NEUTROS PCT: 76.3 % (ref 43.0–77.0)
PLATELETS: 200 10*3/uL (ref 150.0–400.0)
RBC: 4.93 Mil/uL (ref 3.87–5.11)
RDW: 13.2 % (ref 11.5–15.5)
WBC: 7.6 10*3/uL (ref 4.0–10.5)

## 2015-08-11 LAB — HEPATITIS C ANTIBODY: HCV AB: NEGATIVE

## 2015-09-06 ENCOUNTER — Telehealth: Payer: Self-pay | Admitting: Internal Medicine

## 2015-09-06 NOTE — Telephone Encounter (Signed)
LVM for pt to call back as soon as possible.  Reviewed labs.   RE:  Last BMET showed elevated calcium. PCP wants pt to go to the lab only to have this rechecked in 4-6 weeks. Orders are in. Pt can go to the lab between Aug 22 to Sept 11.

## 2015-09-06 NOTE — Telephone Encounter (Signed)
Pt called in said that she has some questions about her return visit.  She as under the assumption that she was to return for some type of blood work? I did not see any orders in so she would like to speak to nurse for further instructions

## 2015-09-10 ENCOUNTER — Other Ambulatory Visit: Payer: Self-pay | Admitting: *Deleted

## 2015-09-10 DIAGNOSIS — I10 Essential (primary) hypertension: Secondary | ICD-10-CM

## 2015-09-10 MED ORDER — VALSARTAN 160 MG PO TABS: 160.0000 mg | ORAL_TABLET | Freq: Every day | ORAL | 1 refills | Status: DC

## 2015-09-14 ENCOUNTER — Encounter: Payer: Self-pay | Admitting: Internal Medicine

## 2015-09-14 ENCOUNTER — Other Ambulatory Visit (INDEPENDENT_AMBULATORY_CARE_PROVIDER_SITE_OTHER): Payer: BLUE CROSS/BLUE SHIELD

## 2015-09-14 LAB — BASIC METABOLIC PANEL
BUN: 10 mg/dL (ref 6–23)
CHLORIDE: 105 meq/L (ref 96–112)
CO2: 28 meq/L (ref 19–32)
Calcium: 8.9 mg/dL (ref 8.4–10.5)
Creatinine, Ser: 0.81 mg/dL (ref 0.40–1.20)
GFR: 74.37 mL/min (ref >60.00)
Glucose, Bld: 98 mg/dL (ref 70–99)
POTASSIUM: 4.3 meq/L (ref 3.5–5.1)
Sodium: 138 mEq/L (ref 135–145)

## 2015-09-20 ENCOUNTER — Ambulatory Visit (INDEPENDENT_AMBULATORY_CARE_PROVIDER_SITE_OTHER): Payer: BLUE CROSS/BLUE SHIELD | Admitting: Gastroenterology

## 2015-09-20 ENCOUNTER — Encounter: Payer: Self-pay | Admitting: Gastroenterology

## 2015-09-20 ENCOUNTER — Telehealth: Payer: Self-pay | Admitting: Gastroenterology

## 2015-09-20 VITALS — BP 118/82 | HR 68 | Ht 63.0 in | Wt 158.0 lb

## 2015-09-20 DIAGNOSIS — K645 Perianal venous thrombosis: Secondary | ICD-10-CM | POA: Diagnosis not present

## 2015-09-20 NOTE — Patient Instructions (Signed)
Your appointment at [facility] is on 09/22/2015 at 1:30pm you will be seeing Vanessa Fuentes  Use Recticare 5% over the counter every 3-4 hours as needed     How to Take a [employer] A sitz bath is a warm water bath that is taken while you are sitting down. The water should only come up to your hips and should cover your buttocks. Your health care provider may recommend a sitz bath to help you:   Clean the lower part of your body, including your genital area.  With itching.  With pain.  With sore muscles or muscles that tighten or spasm. HOW TO TAKE A SITZ BATH Take 3-4 sitz baths per day or as told by your health care provider. 1. Partially fill a bathtub with warm water. You will only need the water to be deep enough to cover your hips and buttocks when you are sitting in it. 2. If your health care provider told you to put medicine in the water, follow the directions exactly. 3. Sit in the water and open the tub drain a little. 4. Turn on the warm water again to keep the tub at the correct level. Keep the water running constantly. 5. Soak in the water for 15-20 minutes or as told by your health care provider. 6. After the sitz bath, pat the affected area dry first. Do not rub it. 7. Be careful when you stand up after the sitz bath because you may feel dizzy. SEEK MEDICAL CARE IF:  Your symptoms get worse. Do not continue with sitz baths if your symptoms get worse.  You have new symptoms. Do not continue with sitz baths until you talk with your health care provider.   This information is not intended to replace advice given to you by your health care provider. Make sure you discuss any questions you have with your health care provider.   Document Released: 10/02/2003 Document Revised: 05/26/2014 Document Reviewed: 01/07/2014 Elsevier Interactive Patient Education [employer].

## 2015-09-20 NOTE — Telephone Encounter (Signed)
The patient states the pain began a couple days ago while out of town. She felt the hemorrhoid began. She now has pain and bleeding. Hurts to sit. She is aware if it is thrombosed, she will need to be seen by a surgeon. She would be interested in banding if it would help.

## 2015-09-20 NOTE — Progress Notes (Signed)
     09/20/2015 Vanessa Fuentes [id number] [birth year]   History of Present Illness:  This is a 70 year old female who is known to Dr. Silverio Decamp for colonoscopy in January 2017. At that time she had 2 polyps removed and were sessile serrated polyps on pathology.  She presents to our office today with complaints of rectal pain. She says that on Thursday she had sudden onset of rectal pain and she actually fell a hemorrhoid protrude. Immediately had bleeding as well. She tried Preparation H with no relief and has been taking sitz baths which has helped some. Feels very slightly better today. Hurts to sit. She denied having any constipation. Never had any similar issues in the past.   Current Medications, Allergies, Past Medical History, Past Surgical History, Family History and Social History were reviewed in [employer] record.   Physical Exam: BP 118/82   Pulse 68   Ht 5\' 3"  (1.6 m)   Wt 158 lb (71.7 kg)   BMI 27.99 kg/m  General: Well developed white female in no acute distress Head: Normocephalic and atraumatic Eyes:  Sclerae anicteric, conjunctiva pink  Ears: Normal auditory acuity Lungs: Clear throughout to auscultation Heart: Regular rate and rhythm Abdomen: Soft, non-distended.  Normal bowel sounds.  Non-tender. Rectal:  Tender thrombosed external hemorrhoid noted with small clot. Musculoskeletal: Symmetrical with no gross deformities  Extremities: No edema  Neurological: Alert oriented x 4, grossly non-focal Psychological:  Alert and cooperative. Normal mood and affect  Assessment and Recommendations: -Thrombosed external hemorrhoid:  Will refer to [facility]. In the interim she will use topical lidocaine and continue sitz baths.

## 2015-09-23 NOTE — Progress Notes (Signed)
Reviewed and agree with documentation and assessment and plan. K. Veena Trig Mcbryar , MD   

## 2016-03-21 ENCOUNTER — Ambulatory Visit (INDEPENDENT_AMBULATORY_CARE_PROVIDER_SITE_OTHER): Payer: BLUE CROSS/BLUE SHIELD | Admitting: Internal Medicine

## 2016-03-21 ENCOUNTER — Telehealth: Payer: Self-pay | Admitting: Internal Medicine

## 2016-03-21 ENCOUNTER — Encounter: Payer: Self-pay | Admitting: Internal Medicine

## 2016-03-21 VITALS — BP 142/86 | HR 66 | Temp 98.1°F | Resp 16 | Wt 163.0 lb

## 2016-03-21 DIAGNOSIS — I10 Essential (primary) hypertension: Secondary | ICD-10-CM | POA: Diagnosis not present

## 2016-03-21 DIAGNOSIS — R079 Chest pain, unspecified: Secondary | ICD-10-CM | POA: Diagnosis not present

## 2016-03-21 DIAGNOSIS — K2100 Gastro-esophageal reflux disease with esophagitis, without bleeding: Secondary | ICD-10-CM

## 2016-03-21 MED ORDER — OMEPRAZOLE 20 MG PO CPDR: 20.0000 mg | DELAYED_RELEASE_CAPSULE | Freq: Every day | ORAL | 3 refills | Status: DC

## 2016-03-21 MED ORDER — VALSARTAN 160 MG PO TABS: 160.0000 mg | ORAL_TABLET | Freq: Every day | ORAL | 1 refills | Status: DC

## 2016-03-21 NOTE — Telephone Encounter (Signed)
Patient Name: Vanessa Fuentes  DOB: 1945-09-11    Initial Comment Caller states having swelling and tingling in knees down to ankles, chest discomfort   Nurse Assessment  Nurse: Julien Girt, RN, Almyra Free Date/Time Eilene Ghazi Time): 03/21/2016 9:04:11 AM  Confirm and document reason for call. If symptomatic, describe symptoms. ---Caller states she is having swelling and tingling in both knees down to her ankles and she has had chest discomfort. Denies chest pain at this time, the swelling in her ankles/calves has improved. "they look normal today" Denies sob. Her sx began a month ago.  Does the patient have any new or worsening symptoms? ---Yes  Will a triage be completed? ---Yes  Related visit to physician within the last 2 weeks? ---No  Does the PT have any chronic conditions? (i.e. diabetes, asthma, etc.) ---Yes  List chronic conditions. ---Htn  Is this a behavioral health or substance abuse call? ---No     Guidelines    Guideline Title Affirmed Question Affirmed Notes  Chest Pain [1] Chest pain lasting <= 5 minutes AND [2] NO chest pain or cardiac symptoms now (Exceptions: pains lasting a few seconds)    Final Disposition User   See Physician within [facility], RN, Almyra Free    Referrals  REFERRED TO PCP OFFICE   Disagree/Comply: Leta Baptist

## 2016-03-21 NOTE — Assessment & Plan Note (Signed)
BP Readings from Last 3 Encounters:  03/21/16 (!) 142/86  09/20/15 118/82  08/10/15 126/84   Slightly elevated today, but typically well controlled Continue current medication

## 2016-03-21 NOTE — Progress Notes (Signed)
Subjective:    Patient ID: Vanessa Fuentes, female    DOB: September 27, 1945, 71 y.o.   MRN: [id number]  HPI She is here for an acute visit.   She comes in for central chest discomfort.  The pain is intermittent.  She feels the symptoms worse with laying down - it feels like something is coming up in her throat.  She stopped eating tomatoes, spicy foods because they made her symptoms worse.  She does not drinks a lot of caffeine - she only drinks water with lemon.    She also has an intermittent cramping feeling substernally that lasts about one second.     She denies difficulty swallowing.  She does experience some burping, which is unusual for her.  She denies nausea/vomiting.    The only change is she started taking apple cider vinegar tablets 4 weeks ago.   She has also had a feeling in her legs like they would explode when sleeping.  She denies cramping.  She denies a restless sensation.  She describes the sensation as burning, tingling at times.  It only occurs when she sleeps.  She has spinal stenosis.  She tried propping her knees up but it has not helped.  She is sleeping on three pillows due to her chest pain.  She denies swelling.      Medications and allergies reviewed with patient and updated if appropriate.  Patient Active Problem List   Diagnosis Date Noted  . Thrombosed external hemorrhoid 09/20/2015  . Hypercalcemia 08/11/2015  . IBS (irritable bowel syndrome) 07/20/2014  . Osteoporosis 04/24/2013  . Hyperglycemia 04/23/2013  . Hyperlipidemia with target LDL less than 130 04/14/2011  . Preventative health care 08/04/2010  . Essential hypertension 10/13/2008    Current Outpatient Prescriptions on File Prior to Visit  Medication Sig Dispense Refill  . acetaminophen (TYLENOL) 650 MG CR tablet Take 650 mg by mouth every 8 (eight) hours as needed for pain.    . cholecalciferol (VITAMIN D) 1000 units tablet Take 2,000 Units by mouth daily.    . valsartan (DIOVAN) 160  MG tablet Take 1 tablet (160 mg total) by mouth daily. 90 tablet 1   No current facility-administered medications on file prior to visit.     Past Medical History:  Diagnosis Date  . Brachial neuritis or radiculitis NOS   . Cervicalgia   . Degeneration of cervical intervertebral disc   . Degeneration of lumbar or lumbosacral intervertebral disc   . Dysfunction of eustachian tube   . Osteoporosis 2017  . Spinal stenosis in cervical region   . Unspecified essential hypertension     Past Surgical History:  Procedure Laterality Date  . CERVICAL DISCECTOMY  07/2009   C3-4 with fusion- Dr Janine Ores  . COLONOSCOPY     polypectomy  . NASAL SEPTUM SURGERY  01/2009   [facility]  . SPINE SURGERY    . TUBAL LIGATION      Social History   Social History  . Marital status: Married    Spouse name: N/A  . Number of children: 1  . Years of education: N/A   Occupational History  . retired    Social History Main Topics  . Smoking status: Former Smoker    Quit date: 02/16/1981  . Smokeless tobacco: Never Used     Comment: During Adolescence  . Alcohol use 1.0 oz/week    2 Standard drinks or equivalent per week  . Drug use: No  . Sexual activity:  Yes    Partners: Male   Other Topics Concern  . 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 Married in 1969, one daughter-'73 and 2 grandchildren.  Work- retired from  [employer] October '12 after 43 years. Life is good.     Family History  Problem Relation Age of Onset  . Hypertension Mother   . Heart disease Mother     CAD MI CABG  . Cancer Mother   . Heart disease Father     CAD MI  . Hypertension Father   . Heart disease Brother     CAD  . Esophageal cancer Brother   . Cancer Maternal Aunt     Breast  . Diabetes Neg Hx   . Colon cancer Neg Hx   . Stomach cancer Neg Hx   . COPD Neg Hx     Review of Systems  Constitutional: Negative for fever.  HENT: Negative for sore throat and trouble swallowing.     Respiratory: Negative for cough, shortness of breath and wheezing.   Cardiovascular: Positive for chest pain. Negative for palpitations.  Gastrointestinal: Negative for abdominal pain, nausea and vomiting.  Neurological: Negative for light-headedness and headaches.       Objective:   Vitals:   03/21/16 1435  BP: (!) 142/86  Pulse: 66  Resp: 16  Temp: 98.1 F (36.7 C)   Filed Weights   03/21/16 1435  Weight: 163 lb (73.9 kg)   Body mass index is 28.87 kg/m.  Wt Readings from Last 3 Encounters:  03/21/16 163 lb (73.9 kg)  09/20/15 158 lb (71.7 kg)  08/10/15 159 lb (72.1 kg)     Physical Exam  Constitutional: She appears well-developed and well-nourished. No distress.  HENT:  Head: Normocephalic and atraumatic.  Eyes: Conjunctivae are normal.  Neck: Neck supple. No tracheal deviation present. No thyromegaly present.  Cardiovascular: Normal rate, regular rhythm and normal heart sounds.   No murmur heard. Pulmonary/Chest: Effort normal and breath sounds normal. No respiratory distress. She has no wheezes. She has no rales. She exhibits no tenderness.  Abdominal: Soft. She exhibits no distension. There is no tenderness.  Musculoskeletal: She exhibits no edema.  Lymphadenopathy:    She has no cervical adenopathy.  Skin: Skin is warm and dry. She is not diaphoretic.          Assessment & Plan:   See Problem List for Assessment and Plan of chronic medical problems.

## 2016-03-21 NOTE — Assessment & Plan Note (Signed)
Symptoms consistent with GERD Stop apple cider vinegar tablets Start omeprazole 20 mg daily - may need to titrate dose depending on symptoms GERD information given  Taper off medication once symptoms controlled Call if symptoms do not improve

## 2016-03-21 NOTE — Progress Notes (Signed)
Pre visit review using our clinic review tool, if applicable. No additional management support is needed unless otherwise documented below in the visit note. 

## 2016-03-21 NOTE — Patient Instructions (Signed)
Medications reviewed and updated.  Changes include starting omeprazole daily for at least two weeks.  Once your symptoms are controlled - taper off the medications slowly.  Contact us with any questions.   Your prescription(s) have been submitted to your pharmacy. Please take as directed and contact our office if you believe you are having problem(s) with the medication(s).   Please followup as needed     Heartburn Heartburn is a type of pain or discomfort that can happen in the throat or chest. It is often described as a burning pain. It may also cause a bad taste in the mouth. Heartburn may feel worse when you lie down or bend over, and it is often worse at night. Heartburn may be caused by stomach contents that move back up into the esophagus (reflux). Follow these instructions at home: Take these actions to decrease your discomfort and to help avoid complications. Diet   Follow a diet as recommended by your health care provider. This may involve avoiding foods and drinks such as:  Coffee and tea (with or without caffeine).  Drinks that contain alcohol.  Energy drinks and sports drinks.  Carbonated drinks or sodas.  Chocolate and cocoa.  Peppermint and mint flavorings.  Garlic and onions.  Horseradish.  Spicy and acidic foods, including peppers, chili powder, curry powder, vinegar, hot sauces, and barbecue sauce.  Citrus fruit juices and citrus fruits, such as oranges, lemons, and limes.  Tomato-based foods, such as red sauce, chili, salsa, and pizza with red sauce.  Fried and fatty foods, such as donuts, french fries, potato chips, and high-fat dressings.  High-fat meats, such as hot dogs and fatty cuts of red and white meats, such as rib eye steak, sausage, ham, and bacon.  High-fat dairy items, such as whole milk, butter, and cream cheese.  Eat small, frequent meals instead of large meals.  Avoid drinking large amounts of liquid with your meals.  Avoid  eating meals during the 2-3 hours before bedtime.  Avoid lying down right after you eat.  Do not exercise right after you eat. General instructions   Pay attention to any changes in your symptoms.  Take over-the-counter and prescription medicines only as told by your health care provider. Do not take aspirin, ibuprofen, or other NSAIDs unless your health care provider told you to do so.  Do not use any tobacco products, including cigarettes, chewing tobacco, and e-cigarettes. If you need help quitting, ask your health care provider.  Wear loose-fitting clothing. Do not wear anything tight around your waist that causes pressure on your abdomen.  Raise (elevate) the head of your bed about 6 inches (15 cm).  Try to reduce your stress, such as with yoga or meditation. If you need help reducing stress, ask your health care provider.  If you are overweight, reduce your weight to an amount that is healthy for you. Ask your health care provider for guidance about a safe weight loss goal.  Keep all follow-up visits as told by your health care provider. This is important. Contact a health care provider if:  You have new symptoms.  You have unexplained weight loss.  You have difficulty swallowing, or it hurts to swallow.  You have wheezing or a persistent cough.  Your symptoms do not improve with treatment.  You have frequent heartburn for more than two weeks. Get help right away if:  You have pain in your arms, neck, jaw, teeth, or back.  You feel sweaty, dizzy, or  light-headed.  You have chest pain or shortness of breath.  You vomit and your vomit looks like blood or coffee grounds.  Your stool is bloody or black. This information is not intended to replace advice given to you by your health care provider. Make sure you discuss any questions you have with your health care provider. Document Released: 05/28/2008 Document Revised: 06/17/2015 Document Reviewed: 05/06/2014 Elsevier  Interactive Patient Education  2017 [employer].

## 2016-03-21 NOTE — Assessment & Plan Note (Signed)
EKG normal Unlikely cardiac in nature  - symptoms related to GERD

## 2016-04-10 LAB — HM MAMMOGRAPHY

## 2016-06-12 ENCOUNTER — Ambulatory Visit (HOSPITAL_COMMUNITY)
Admission: RE | Admit: 2016-06-12 | Discharge: 2016-06-12 | Disposition: A | Discharge status: Home / Self Care | Payer: BLUE CROSS/BLUE SHIELD | Source: Ambulatory Visit | Attending: Obstetrics & Gynecology | Admitting: Obstetrics & Gynecology

## 2016-06-12 ENCOUNTER — Encounter (HOSPITAL_COMMUNITY): Payer: Self-pay

## 2016-06-12 DIAGNOSIS — M810 Age-related osteoporosis without current pathological fracture: Secondary | ICD-10-CM | POA: Insufficient documentation

## 2016-06-12 MED ORDER — ZOLEDRONIC ACID 5 MG/100ML IV SOLN
5.0000 mg | Freq: Once | INTRAVENOUS | Status: AC
  Administered 2016-06-12: 5 mg via INTRAVENOUS
  Filled 2016-06-12: qty 100

## 2016-06-12 MED ORDER — SODIUM CHLORIDE 0.9 % IV SOLN
Freq: Once | INTRAVENOUS | Status: AC
  Administered 2016-06-12: 13:00:00 via INTRAVENOUS

## 2016-06-12 NOTE — Discharge Instructions (Signed)

## 2016-06-12 NOTE — Progress Notes (Signed)
D/c instructions given on reclast.  Pt has had before and did well.  Reminded pt to hydrate well over the next couple of days and take tylenol for joint pain.  Pt voiced understanding.  Pt states her MD told her she doesn't have to take Vitamin D and calcium.  Pt's calcium level is 9.6, which is WNL.  Pt was d/c ambulatory to lobby.

## 2016-10-02 ENCOUNTER — Other Ambulatory Visit: Payer: Self-pay | Admitting: Internal Medicine

## 2016-10-02 DIAGNOSIS — I10 Essential (primary) hypertension: Secondary | ICD-10-CM

## 2016-10-17 ENCOUNTER — Other Ambulatory Visit (INDEPENDENT_AMBULATORY_CARE_PROVIDER_SITE_OTHER): Payer: BLUE CROSS/BLUE SHIELD

## 2016-10-17 ENCOUNTER — Ambulatory Visit (INDEPENDENT_AMBULATORY_CARE_PROVIDER_SITE_OTHER)
Admission: RE | Admit: 2016-10-17 | Discharge: 2016-10-17 | Disposition: A | Discharge status: Home / Self Care | Payer: BLUE CROSS/BLUE SHIELD | Source: Ambulatory Visit | Attending: Internal Medicine | Admitting: Internal Medicine

## 2016-10-17 ENCOUNTER — Ambulatory Visit (INDEPENDENT_AMBULATORY_CARE_PROVIDER_SITE_OTHER): Payer: BLUE CROSS/BLUE SHIELD | Admitting: Internal Medicine

## 2016-10-17 ENCOUNTER — Encounter: Payer: Self-pay | Admitting: Internal Medicine

## 2016-10-17 VITALS — BP 130/80 | HR 71 | Temp 98.2°F | Ht 63.0 in | Wt 168.1 lb

## 2016-10-17 DIAGNOSIS — Z0000 Encounter for general adult medical examination without abnormal findings: Secondary | ICD-10-CM

## 2016-10-17 DIAGNOSIS — K2100 Gastro-esophageal reflux disease with esophagitis, without bleeding: Secondary | ICD-10-CM

## 2016-10-17 DIAGNOSIS — M545 Low back pain, unspecified: Secondary | ICD-10-CM | POA: Insufficient documentation

## 2016-10-17 DIAGNOSIS — G8929 Other chronic pain: Secondary | ICD-10-CM | POA: Diagnosis not present

## 2016-10-17 DIAGNOSIS — Z23 Encounter for immunization: Secondary | ICD-10-CM

## 2016-10-17 DIAGNOSIS — I10 Essential (primary) hypertension: Secondary | ICD-10-CM

## 2016-10-17 DIAGNOSIS — E785 Hyperlipidemia, unspecified: Secondary | ICD-10-CM

## 2016-10-17 LAB — CBC WITH DIFFERENTIAL/PLATELET
BASOS PCT: 0.7 % (ref 0.0–3.0)
Basophils Absolute: 0.1 10*3/uL (ref 0.0–0.1)
EOS PCT: 2.4 % (ref 0.0–5.0)
Eosinophils Absolute: 0.2 10*3/uL (ref 0.0–0.7)
HCT: 41.6 % (ref 36.0–46.0)
Hemoglobin: 14.1 g/dL (ref 12.0–15.0)
Lymphocytes Relative: 25.4 % (ref 12.0–46.0)
Lymphs Abs: 2 10*3/uL (ref 0.7–4.0)
MCHC: 33.8 g/dL (ref 30.0–36.0)
MCV: 92.3 fl (ref 78.0–100.0)
MONO ABS: 0.5 10*3/uL (ref 0.1–1.0)
Monocytes Relative: 6.8 % (ref 3.0–12.0)
NEUTROS ABS: 5.2 10*3/uL (ref 1.4–7.7)
NEUTROS PCT: 64.7 % (ref 43.0–77.0)
PLATELETS: 176 10*3/uL (ref 150.0–400.0)
RBC: 4.51 Mil/uL (ref 3.87–5.11)
RDW: 12.9 % (ref 11.5–15.5)
WBC: 8 10*3/uL (ref 4.0–10.5)

## 2016-10-17 LAB — COMPREHENSIVE METABOLIC PANEL
ALK PHOS: 43 U/L (ref 39–117)
ALT: 10 U/L (ref 0–35)
AST: 15 U/L (ref 0–37)
Albumin: 4.3 g/dL (ref 3.5–5.2)
BUN: 13 mg/dL (ref 6–23)
CO2: 29 meq/L (ref 19–32)
Calcium: 9.3 mg/dL (ref 8.4–10.5)
Chloride: 103 mEq/L (ref 96–112)
Creatinine, Ser: 0.82 mg/dL (ref 0.40–1.20)
GFR: 73.09 mL/min (ref >60.00)
GLUCOSE: 97 mg/dL (ref 70–99)
Potassium: 4 mEq/L (ref 3.5–5.1)
Sodium: 138 mEq/L (ref 135–145)
TOTAL PROTEIN: 6.7 g/dL (ref 6.0–8.3)
Total Bilirubin: 0.5 mg/dL (ref 0.2–1.2)

## 2016-10-17 LAB — LIPID PANEL
Cholesterol: 219 mg/dL — ABNORMAL HIGH (ref 0–200)
HDL: 58.9 mg/dL (ref >39.00)
LDL Cholesterol: 129 mg/dL — ABNORMAL HIGH (ref 0–99)
NONHDL: 160.4
Total CHOL/HDL Ratio: 4
Triglycerides: 158 mg/dL — ABNORMAL HIGH (ref 0.0–149.0)
VLDL: 31.6 mg/dL (ref 0.0–40.0)

## 2016-10-17 NOTE — Patient Instructions (Signed)

## 2016-10-17 NOTE — Progress Notes (Signed)
Subjective:  Patient ID: Vanessa Fuentes, female    DOB: September 26, 1945  Age: 71 y.o. MRN: [id number]  CC: Annual Exam; Hypertension; Back Pain; and Hyperlipidemia   HPI Vanessa Fuentes presents for a CPX.  She complains of chronic low back pain that occasionally radiates into her right upper thigh and right hip. She tells me the pain increases with movement. She denies numbness/weakness/tingling in her lower extremities. She takes Tylenol for symptom relief. She denies recent trauma or injury.  Her blood pressure has been well controlled and she's had no episodes of CP, DOE, SOB, palpitations, edema, or fatigue.  Past Medical History:  Diagnosis Date  . Brachial neuritis or radiculitis NOS   . Cervicalgia   . Degeneration of cervical intervertebral disc   . Degeneration of lumbar or lumbosacral intervertebral disc   . Dysfunction of eustachian tube   . Osteoporosis 2017  . Spinal stenosis in cervical region   . Unspecified essential hypertension    Past Surgical History:  Procedure Laterality Date  . CERVICAL DISCECTOMY  07/2009   C3-4 with fusion- Dr Janine Ores  . COLONOSCOPY     polypectomy  . NASAL SEPTUM SURGERY  01/2009   [facility]  . SPINE SURGERY    . TUBAL LIGATION      reports that she quit smoking about 35 years ago. She has never used smokeless tobacco. She reports that she drinks about 1.0 oz of alcohol per week . She reports that she does not use drugs. family history includes Cancer in her maternal aunt and mother; Esophageal cancer in her brother; Heart disease in her brother, father, and mother; Hypertension in her father and mother. Allergies  Allergen Reactions  . Sulfonamide Derivatives     Blisters eyes    Outpatient Medications Prior to Visit  Medication Sig Dispense Refill  . valsartan (DIOVAN) 160 MG tablet Take 1 tablet (160 mg total) by mouth daily. Annual appt is overdue must see provider for future refills 30 tablet 0  . acetaminophen (TYLENOL)  650 MG CR tablet Take 500 mg by mouth every 8 (eight) hours as needed for pain.     . cholecalciferol (VITAMIN D) 1000 units tablet Take 2,000 Units by mouth daily.    . Multiple Vitamins-Minerals (PRESERVISION AREDS 2 PO) Take 2 tablets by mouth daily.    Marland Kitchen omeprazole (PRILOSEC) 20 MG capsule Take 1 capsule (20 mg total) by mouth daily. Take 30 minutes before a meal 30 capsule 3   No facility-administered medications prior to visit.     ROS Review of Systems  Constitutional: Negative.  Negative for appetite change, diaphoresis, fatigue and unexpected weight change.  HENT: Negative.  Negative for sinus pressure and trouble swallowing.   Eyes: Negative for visual disturbance.  Respiratory: Negative for cough, chest tightness, shortness of breath and wheezing.   Cardiovascular: Negative for chest pain, palpitations and leg swelling.  Gastrointestinal: Negative for abdominal pain, constipation, diarrhea, nausea and vomiting.  Endocrine: Negative.   Genitourinary: Negative.  Negative for difficulty urinating.  Musculoskeletal: Positive for back pain. Negative for myalgias and neck pain.  Skin: Negative.   Allergic/Immunologic: Negative.   Neurological: Negative.  Negative for dizziness, weakness, numbness and headaches.  Hematological: Negative for adenopathy. Does not bruise/bleed easily.  Psychiatric/Behavioral: Negative.     Objective:  BP 130/80 (BP Location: Left Arm, Patient Position: Sitting, Cuff Size: Normal)   Pulse 71   Temp 98.2 F (36.8 C) (Oral)   Ht 5\' 3"  (1.6  m)   Wt 168 lb 2 oz (76.3 kg)   SpO2 98%   BMI 29.78 kg/m   BP Readings from Last 3 Encounters:  10/17/16 130/80  06/12/16 102/76  03/21/16 (!) 142/86    Wt Readings from Last 3 Encounters:  10/17/16 168 lb 2 oz (76.3 kg)  03/21/16 163 lb (73.9 kg)  09/20/15 158 lb (71.7 kg)    Physical Exam  Constitutional: She is oriented to person, place, and time. No distress.  HENT:  Mouth/Throat: Oropharynx  is clear and moist. No oropharyngeal exudate.  Eyes: Conjunctivae are normal. Right eye exhibits no discharge. Left eye exhibits no discharge. No scleral icterus.  Neck: Normal range of motion. Neck supple. No JVD present. No thyromegaly present.  Cardiovascular: Normal rate, regular rhythm and intact distal pulses.  Exam reveals no gallop and no friction rub.   No murmur heard. Pulmonary/Chest: Effort normal and breath sounds normal. No respiratory distress. She has no wheezes. She has no rales. She exhibits no tenderness.  Abdominal: Soft. Bowel sounds are normal. She exhibits no distension and no mass. There is no tenderness. There is no rebound and no guarding.  Musculoskeletal: Normal range of motion. She exhibits no edema, tenderness or deformity.  Lymphadenopathy:    She has no cervical adenopathy.  Neurological: She is alert and oriented to person, place, and time. She has normal strength. She displays no atrophy and no tremor. No cranial nerve deficit or sensory deficit. She exhibits normal muscle tone. She displays a negative Romberg sign. She displays no seizure activity. Coordination and gait normal.  Reflex Scores:      Tricep reflexes are 1+ on the right side and 1+ on the left side.      Bicep reflexes are 1+ on the right side and 1+ on the left side.      Brachioradialis reflexes are 1+ on the right side and 1+ on the left side.      Patellar reflexes are 1+ on the right side and 1+ on the left side.      Achilles reflexes are 1+ on the right side and 1+ on the left side. Neg SLR in BLE  Skin: Skin is warm and dry. No rash noted. She is not diaphoretic. No erythema. No pallor.  Vitals reviewed.   Lab Results  Component Value Date   WBC 8.0 10/17/2016   HGB 14.1 10/17/2016   HCT 41.6 10/17/2016   PLT 176.0 10/17/2016   GLUCOSE 97 10/17/2016   CHOL 219 (H) 10/17/2016   TRIG 158.0 (H) 10/17/2016   HDL 58.90 10/17/2016   LDLDIRECT 148.3 04/13/2011   LDLCALC 129 (H)  10/17/2016   ALT 10 10/17/2016   AST 15 10/17/2016   NA 138 10/17/2016   K 4.0 10/17/2016   CL 103 10/17/2016   CREATININE 0.82 10/17/2016   BUN 13 10/17/2016   CO2 29 10/17/2016   TSH 0.88 10/17/2016   HGBA1C 5.0 08/10/2015    No results found.  Assessment & Plan:   Camyla was seen today for annual exam, hypertension, back pain and hyperlipidemia.  Diagnoses and all orders for this visit:  Essential hypertension- Her blood pressure is well controlled. Electrolytes and renal function are normal. -     Comprehensive metabolic panel; Future -     Thyroid Panel With TSH; Future  Gastroesophageal reflux disease with esophagitis- her symptoms are well controlled with PPI. No complications are noted. -     CBC with Differential/Platelet; Future  Hypercalcemia- her calcium level is normal now -     Comprehensive metabolic panel; Future  Hyperlipidemia with target LDL less than 130- she has a mildly elevated ASCVD risk score. I've asked her to start taking a statin for CV risk reduction. -     Lipid panel; Future -     Thyroid Panel With TSH; Future -     rosuvastatin (CRESTOR) 5 MG tablet; Take 1 tablet (5 mg total) by mouth daily.  Chronic midline low back pain, with sciatica presence unspecified- exam is unremarkable, plain film is positive for DDD. She'll continue Tylenol as noted and she will let me know if she wants to consider additional treatment options. -     DG Lumbar Spine Complete; Future  Need for influenza vaccination -     Cancel: Flu Vaccine QUAD 36+ mos IM -     Flu vaccine HIGH DOSE PF (Fluzone High dose)  Preventative health care   I have discontinued Ms. Helbling's cholecalciferol, acetaminophen, omeprazole, and Multiple Vitamins-Minerals (PRESERVISION AREDS 2 PO). I am also having her start on rosuvastatin. Additionally, I am having her maintain her valsartan.  Meds ordered this encounter  Medications  . rosuvastatin (CRESTOR) 5 MG tablet    Sig: Take  1 tablet (5 mg total) by mouth daily.    Dispense:  90 tablet    Refill:  3   See AVS for instructions about healthy living and anticipatory guidance.  Follow-up: Return in about 2 months (around 12/17/2016).  Scarlette Calico, MD

## 2016-10-18 ENCOUNTER — Encounter: Payer: Self-pay | Admitting: Internal Medicine

## 2016-10-18 LAB — THYROID PANEL WITH TSH
Free Thyroxine Index: 3.1 (ref 1.4–3.8)
T3 UPTAKE: 32 % (ref 22–35)
T4, Total: 9.8 ug/dL (ref 5.1–11.9)
TSH: 0.88 m[IU]/L (ref 0.40–4.50)

## 2016-10-18 MED ORDER — ROSUVASTATIN CALCIUM 5 MG PO TABS: 5.0000 mg | ORAL_TABLET | Freq: Every day | ORAL | 3 refills | Status: DC

## 2016-10-18 NOTE — Assessment & Plan Note (Signed)

## 2016-10-19 ENCOUNTER — Other Ambulatory Visit: Payer: Self-pay | Admitting: Internal Medicine

## 2016-10-19 DIAGNOSIS — M5440 Lumbago with sciatica, unspecified side: Secondary | ICD-10-CM

## 2016-10-19 DIAGNOSIS — M5137 Other intervertebral disc degeneration, lumbosacral region: Secondary | ICD-10-CM

## 2016-10-19 DIAGNOSIS — G8929 Other chronic pain: Secondary | ICD-10-CM

## 2016-10-30 ENCOUNTER — Encounter: Payer: Self-pay | Admitting: Nurse Practitioner

## 2016-10-30 ENCOUNTER — Ambulatory Visit (INDEPENDENT_AMBULATORY_CARE_PROVIDER_SITE_OTHER)
Admission: RE | Admit: 2016-10-30 | Discharge: 2016-10-30 | Disposition: A | Discharge status: Home / Self Care | Payer: BLUE CROSS/BLUE SHIELD | Source: Ambulatory Visit | Attending: Nurse Practitioner | Admitting: Nurse Practitioner

## 2016-10-30 ENCOUNTER — Ambulatory Visit (INDEPENDENT_AMBULATORY_CARE_PROVIDER_SITE_OTHER): Payer: BLUE CROSS/BLUE SHIELD | Admitting: Nurse Practitioner

## 2016-10-30 ENCOUNTER — Telehealth: Payer: Self-pay | Admitting: Internal Medicine

## 2016-10-30 ENCOUNTER — Other Ambulatory Visit (INDEPENDENT_AMBULATORY_CARE_PROVIDER_SITE_OTHER): Payer: BLUE CROSS/BLUE SHIELD

## 2016-10-30 VITALS — BP 132/86 | HR 71 | Temp 97.6°F | Ht 63.0 in | Wt 165.0 lb

## 2016-10-30 DIAGNOSIS — R5381 Other malaise: Secondary | ICD-10-CM

## 2016-10-30 DIAGNOSIS — R197 Diarrhea, unspecified: Secondary | ICD-10-CM

## 2016-10-30 DIAGNOSIS — R05 Cough: Secondary | ICD-10-CM

## 2016-10-30 DIAGNOSIS — R059 Cough, unspecified: Secondary | ICD-10-CM

## 2016-10-30 DIAGNOSIS — R5383 Other fatigue: Secondary | ICD-10-CM | POA: Diagnosis not present

## 2016-10-30 DIAGNOSIS — B349 Viral infection, unspecified: Secondary | ICD-10-CM

## 2016-10-30 LAB — CBC WITH DIFFERENTIAL/PLATELET
BASOS ABS: 0.1 10*3/uL (ref 0.0–0.1)
Basophils Relative: 0.9 % (ref 0.0–3.0)
EOS ABS: 0.1 10*3/uL (ref 0.0–0.7)
Eosinophils Relative: 1.4 % (ref 0.0–5.0)
HCT: 41.1 % (ref 36.0–46.0)
Hemoglobin: 14 g/dL (ref 12.0–15.0)
LYMPHS ABS: 1.7 10*3/uL (ref 0.7–4.0)
LYMPHS PCT: 19.6 % (ref 12.0–46.0)
MCHC: 34.1 g/dL (ref 30.0–36.0)
MCV: 90.6 fl (ref 78.0–100.0)
MONOS PCT: 7.4 % (ref 3.0–12.0)
Monocytes Absolute: 0.6 10*3/uL (ref 0.1–1.0)
NEUTROS PCT: 70.7 % (ref 43.0–77.0)
Neutro Abs: 6 10*3/uL (ref 1.4–7.7)
PLATELETS: 262 10*3/uL (ref 150.0–400.0)
RBC: 4.54 Mil/uL (ref 3.87–5.11)
RDW: 12.3 % (ref 11.5–15.5)
WBC: 8.5 10*3/uL (ref 4.0–10.5)

## 2016-10-30 LAB — BASIC METABOLIC PANEL
BUN: 10 mg/dL (ref 6–23)
CHLORIDE: 102 meq/L (ref 96–112)
CO2: 28 meq/L (ref 19–32)
Calcium: 9.6 mg/dL (ref 8.4–10.5)
Creatinine, Ser: 0.72 mg/dL (ref 0.40–1.20)
GFR: 84.92 mL/min (ref >60.00)
Glucose, Bld: 101 mg/dL — ABNORMAL HIGH (ref 70–99)
POTASSIUM: 4.2 meq/L (ref 3.5–5.1)
Sodium: 138 mEq/L (ref 135–145)

## 2016-10-30 NOTE — Progress Notes (Signed)
Subjective:  Patient ID: Vanessa Fuentes, female    DOB: 04-Feb-1945  Age: 71 y.o. MRN: [id number]  CC: Headache (headache,diarrhea,weak, chill,pressure in face,not feeling good for 2 wks. took OTC had flu shot?--crestor consult?)   Headache   This is a new problem. The current episode started in the past 7 days. The problem occurs constantly. The problem has been unchanged. The pain is located in the frontal region. The pain does not radiate. The pain quality is not similar to prior headaches. The quality of the pain is described as aching and dull. Associated symptoms include anorexia, coughing, muscle aches, sinus pressure and weakness. Pertinent negatives include no abdominal pain, blurred vision, fever, insomnia, nausea, numbness, phonophobia, photophobia, scalp tenderness, sore throat, tinnitus, visual change or vomiting. Associated symptoms comments: Diarrhea. Nothing aggravates the symptoms. She has tried nothing for the symptoms.  Diarrhea   This is a new problem. The current episode started in the past 7 days. The problem occurs less than 2 times per day. The problem has been unchanged. The stool consistency is described as watery. The patient states that diarrhea does not awaken her from sleep. Associated symptoms include bloating, chills, coughing, headaches, myalgias and a URI. Pertinent negatives include no abdominal pain, fever, sweats or vomiting. There are no known risk factors. She has tried increased fluids for the symptoms. The treatment provided mild relief.  influenza vaccine was administered 10/17/16. Onset of symptoms 10/23/16.  Did not start crestor Rx. She was not sure why medication was prescribed.  Outpatient Medications Prior to Visit  Medication Sig Dispense Refill  . valsartan (DIOVAN) 160 MG tablet Take 1 tablet (160 mg total) by mouth daily. Annual appt is overdue must see provider for future refills 30 tablet 0  . rosuvastatin (CRESTOR) 5 MG tablet Take 1  tablet (5 mg total) by mouth daily. (Patient not taking: Reported on 10/30/2016) 90 tablet 3   No facility-administered medications prior to visit.     ROS See HPI  Objective:  BP 132/86   Pulse 71   Temp 97.6 F (36.4 C)   Ht 5\' 3"  (1.6 m)   Wt 165 lb (74.8 kg)   SpO2 95%   BMI 29.23 kg/m   BP Readings from Last 3 Encounters:  10/30/16 132/86  10/17/16 130/80  06/12/16 102/76    Wt Readings from Last 3 Encounters:  10/30/16 165 lb (74.8 kg)  10/17/16 168 lb 2 oz (76.3 kg)  03/21/16 163 lb (73.9 kg)    Physical Exam  Constitutional: She is oriented to person, place, and time. No distress.  HENT:  Nose: Right sinus exhibits maxillary sinus tenderness and frontal sinus tenderness. Left sinus exhibits maxillary sinus tenderness and frontal sinus tenderness.  Mouth/Throat: No trismus in the jaw. No oropharyngeal exudate or posterior oropharyngeal erythema.  Cardiovascular: Normal rate and regular rhythm.   Pulmonary/Chest: Effort normal and breath sounds normal. No respiratory distress.  Abdominal: Soft. Bowel sounds are normal. She exhibits no distension. There is no tenderness.  Musculoskeletal: She exhibits no edema.  Neurological: She is alert and oriented to person, place, and time.  Skin: Skin is warm and dry. No rash noted. No erythema.  Vitals reviewed.   Lab Results  Component Value Date   WBC 8.5 10/30/2016   HGB 14.0 10/30/2016   HCT 41.1 10/30/2016   PLT 262.0 10/30/2016   GLUCOSE 101 (H) 10/30/2016   CHOL 219 (H) 10/17/2016   TRIG 158.0 (H) 10/17/2016   HDL 58.90  10/17/2016   LDLDIRECT 148.3 04/13/2011   LDLCALC 129 (H) 10/17/2016   ALT 10 10/17/2016   AST 15 10/17/2016   NA 138 10/30/2016   K 4.2 10/30/2016   CL 102 10/30/2016   CREATININE 0.72 10/30/2016   BUN 10 10/30/2016   CO2 28 10/30/2016   TSH 0.88 10/17/2016   HGBA1C 5.0 08/10/2015    Dg Lumbar Spine Complete  Result Date: 10/17/2016 CLINICAL DATA:  Chronic low back pain with  bilateral sciatic symptoms. No known injury. EXAM: LUMBAR SPINE - COMPLETE 4+ VIEW COMPARISON:  MRI of the lumbar spine of January 02, 2014 and lumbar spine series of April 12, 2006. FINDINGS: The bones are subjectively osteopenic. There is curvature of the lumbar spine convex toward the right centered at L1. The vertebral bodies are preserved in height. There is multilevel degenerative disc space narrowing at the thoracolumbar junction and throughout the lumbar spine. There is facet joint hypertrophy at L5-S1. The pedicles are intact. The observed portions of the sacrum are normal. IMPRESSION: There is multilevel degenerative disc disease of the lumbar spine. There is gentle dextrocurvature centered at L1. There is no compression fracture nor significant spondylolisthesis. Electronically Signed   By: David  Martinique M.D.   On: 10/17/2016 15:25    Assessment & Plan:   Tyanna was seen today for headache.  Diagnoses and all orders for this visit:  Cough -     DG Chest 2 View; Future  Diarrhea, unspecified type -     Basic metabolic panel; Future  Malaise and fatigue -     CBC w/Diff; Future -     Basic metabolic panel; Future -     DG Chest 2 View; Future   I am having Ms. Cuffee maintain her valsartan and rosuvastatin.  No orders of the defined types were placed in this encounter.   Follow-up: No Follow-up on file.  Wilfred Lacy, NP

## 2016-10-30 NOTE — Telephone Encounter (Signed)
[facility]  Patient Name: Vanessa Fuentes  DOB: 1945-03-31    Initial Comment Caller states she recently had her flu shot and she thinks she may have had a reaction. She states she has been tired and her head has been hurting. She has been taking Tylenol for the pain.    Nurse Assessment  Nurse: Harlow Mares, RN, Suanne Marker Date/Time (Eastern Time): 10/30/2016 9:04:34 AM  Confirm and document reason for call. If symptomatic, describe symptoms. ---Caller states she recently had her flu shot and she thinks she may have had a reaction. She states she has been tired and her head has been hurting. She has been taking Tylenol for the pain. Reports that she has had a headache for a week. Off/on. Reports chest cough, no chills.  Does the patient have any new or worsening symptoms? ---Yes  Will a triage be completed? ---Yes  Related visit to physician within the last 2 weeks? ---No  Does the PT have any chronic conditions? (i.e. diabetes, asthma, etc.) ---Yes  List chronic conditions. ---HTN  Is this a behavioral health or substance abuse call? ---No     Guidelines    Guideline Title Affirmed Question Affirmed Notes  Headache [1] New headache AND [2] age > 44    Final Disposition User   See Physician within 24 Hours [place], RN, Suanne Marker    Comments  Appt made with Wilfred Lacy at the [place] office for 10:45am this morning.   Referrals  REFERRED TO PCP OFFICE   Caller Disagree/Comply Comply  Caller Understands Yes  PreDisposition Did not know what to do

## 2016-10-30 NOTE — Patient Instructions (Addendum)
Encourage adequate oral hydration.  Normal CXR. Normal labs results I think symptoms are related to viral infection. Key to treatment in rest and oral hydration. Follow Brat diet for 2days then advance as tolerated.  Food Choices to Help Relieve Diarrhea, Adult When you have diarrhea, the foods you eat and your eating habits are very important. Choosing the right foods and drinks can help:  Relieve diarrhea.  Replace lost fluids and nutrients.  Prevent dehydration.  What general guidelines should I follow? Relieving diarrhea  Choose foods with less than 2 g or .07 oz. of fiber per serving.  Limit fats to less than 8 tsp (38 g or 1.34 oz.) a day.  Avoid the following: ? Foods and beverages sweetened with high-fructose corn syrup, honey, or sugar alcohols such as xylitol, sorbitol, and mannitol. ? Foods that contain a lot of fat or sugar. ? Fried, greasy, or spicy foods. ? High-fiber grains, breads, and cereals. ? Raw fruits and vegetables.  Eat foods that are rich in probiotics. These foods include dairy products such as yogurt and fermented milk products. They help increase healthy bacteria in the stomach and intestines (gastrointestinal tract, or GI tract).  If you have lactose intolerance, avoid dairy products. These may make your diarrhea worse.  Take medicine to help stop diarrhea (antidiarrheal medicine) only as told by your health care provider. Replacing nutrients  Eat small meals or snacks every 3-4 hours.  Eat bland foods, such as white rice, toast, or baked potato, until your diarrhea starts to get better. Gradually reintroduce nutrient-rich foods as tolerated or as told by your health care provider. This includes: ? Well-cooked protein foods. ? Peeled, seeded, and soft-cooked fruits and vegetables. ? Low-fat dairy products.  Take vitamin and mineral supplements as told by your health care provider. Preventing dehydration   Start by sipping water or a special  solution to prevent dehydration (oral rehydration solution, ORS). Urine that is clear or pale yellow means that you are getting enough fluid.  Try to drink at least 8-10 cups of fluid each day to help replace lost fluids.  You may add other liquids in addition to water, such as clear juice or decaffeinated sports drinks, as tolerated or as told by your health care provider.  Avoid drinks with caffeine, such as coffee, tea, or soft drinks.  Avoid alcohol. What foods are recommended? The items listed may not be a complete list. Talk with your health care provider about what dietary choices are best for you. Grains White rice. White, [place], or pita breads (fresh or toasted), including plain rolls, buns, or bagels. White pasta. Saltine, soda, or graham crackers. Pretzels. Low-fiber cereal. Cooked cereals made with water (such as cornmeal, farina, or cream cereals). Plain muffins. Matzo. Melba toast. Zwieback. Vegetables Potatoes (without the skin). Most well-cooked and canned vegetables without skins or seeds. Tender lettuce. Fruits Apple sauce. Fruits canned in juice. Cooked apricots, cherries, grapefruit, peaches, pears, or plums. Fresh bananas and cantaloupe. Meats and other protein foods Baked or boiled chicken. Eggs. Tofu. Fish. Seafood. Smooth nut butters. Ground or well-cooked tender beef, ham, veal, lamb, pork, or poultry. Dairy Plain yogurt, kefir, and unsweetened liquid yogurt. Lactose-free milk, buttermilk, skim milk, or soy milk. Low-fat or nonfat hard cheese. Beverages Water. Low-calorie sports drinks. Fruit juices without pulp. Strained tomato and vegetable juices. Decaffeinated teas. Sugar-free beverages not sweetened with sugar alcohols. Oral rehydration solutions, if approved by your health care provider. Seasoning and other foods Bouillon, broth, or soups made  from recommended foods. What foods are not recommended? The items listed may not be a complete list. Talk with your  health care provider about what dietary choices are best for you. Grains Whole grain, whole wheat, bran, or rye breads, rolls, pastas, and crackers. Wild or brown rice. Whole grain or bran cereals. Barley. Oats and oatmeal. Corn tortillas or taco shells. Granola. Popcorn. Vegetables Raw vegetables. Fried vegetables. Cabbage, broccoli, Brussels sprouts, artichokes, baked beans, beet greens, corn, kale, legumes, peas, sweet potatoes, and yams. Potato skins. Cooked spinach and cabbage. Fruits Dried fruit, including raisins and dates. Raw fruits. Stewed or dried prunes. Canned fruits with syrup. Meat and other protein foods Fried or fatty meats. Deli meats. Chunky nut butters. Nuts and seeds. Beans and lentils. Berniece Salines. Hot dogs. Sausage. Dairy High-fat cheeses. Whole milk, chocolate milk, and beverages made with milk, such as milk shakes. Half-and-half. Cream. sour cream. Ice cream. Beverages Caffeinated beverages (such as coffee, tea, soda, or energy drinks). Alcoholic beverages. Fruit juices with pulp. Prune juice. Soft drinks sweetened with high-fructose corn syrup or sugar alcohols. High-calorie sports drinks. Fats and oils Butter. Cream sauces. Margarine. Salad oils. Plain salad dressings. Olives. Avocados. Mayonnaise. Sweets and desserts Sweet rolls, doughnuts, and sweet breads. Sugar-free desserts sweetened with sugar alcohols such as xylitol and sorbitol. Seasoning and other foods Honey. Hot sauce. Chili powder. Gravy. Cream-based or milk-based soups. Pancakes and waffles. Summary  When you have diarrhea, the foods you eat and your eating habits are very important.  Make sure you get at least 8-10 cups of fluid each day, or enough to keep your urine clear or pale yellow.  Eat bland foods and gradually reintroduce healthy, nutrient-rich foods as tolerated, or as told by your health care provider.  Avoid high-fiber, fried, greasy, or spicy foods. This information is not intended to  replace advice given to you by your health care provider. Make sure you discuss any questions you have with your health care provider. Document Released: 04/01/2003 Document Revised: 01/07/2016 Document Reviewed: 01/07/2016 Elsevier Interactive Patient Education  2017 [employer].

## 2016-11-08 ENCOUNTER — Other Ambulatory Visit: Payer: Self-pay | Admitting: Internal Medicine

## 2016-11-08 DIAGNOSIS — I10 Essential (primary) hypertension: Secondary | ICD-10-CM

## 2016-11-10 ENCOUNTER — Encounter: Payer: Self-pay | Admitting: Physical Medicine & Rehabilitation

## 2016-11-24 ENCOUNTER — Encounter: Payer: BLUE CROSS/BLUE SHIELD | Attending: Physical Medicine & Rehabilitation

## 2016-11-24 ENCOUNTER — Encounter: Payer: Self-pay | Admitting: Physical Medicine & Rehabilitation

## 2016-11-24 ENCOUNTER — Ambulatory Visit (HOSPITAL_BASED_OUTPATIENT_CLINIC_OR_DEPARTMENT_OTHER): Payer: BLUE CROSS/BLUE SHIELD | Admitting: Physical Medicine & Rehabilitation

## 2016-11-24 VITALS — BP 119/79 | HR 69

## 2016-11-24 DIAGNOSIS — M419 Scoliosis, unspecified: Secondary | ICD-10-CM | POA: Diagnosis not present

## 2016-11-24 DIAGNOSIS — M5441 Lumbago with sciatica, right side: Secondary | ICD-10-CM

## 2016-11-24 DIAGNOSIS — G8929 Other chronic pain: Secondary | ICD-10-CM | POA: Insufficient documentation

## 2016-11-24 DIAGNOSIS — M545 Low back pain: Secondary | ICD-10-CM | POA: Diagnosis not present

## 2016-11-24 NOTE — Progress Notes (Signed)
Subjective:    Patient ID: Vanessa Fuentes, female    DOB: August 10, 1945, 71 y.o.   MRN: [id number]  HPI  CC:  Low back and Right thigh pain  3-4 yr hx with mo antecedant fall or injury.  Pain does not occur when in bed.  Has pain with prolonged car rides.  Walking aggravates pain as well.Patient denies any numbness or tingling in the right thigh. No bowel or bladder incontinence, history of irritable bowel Denies any weakness or giving away of the legs.  Has not had PT for pain, used to see DC but not any longer. Retired at age 2 y.  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 Patient is independent with all her self-care and mobility. Her sleep is fair. Her husband continues to work. She plays golf.  Takes acetaminophen one time a week, does not wish to take any stronger medications.  Pain Inventory Average Pain 0 Pain Right Now 0 My pain is burning and dull  In the last 24 hours, has pain interfered with the following? General activity 0 Relation with others 0 Enjoyment of life 0 What TIME of day is your pain at its worst? daytime Sleep (in general) Fair  Pain is worse with: sitting and standing Pain improves with: rest and heat/ice Relief from Meds: 0  Mobility walk without assistance ability to climb steps?  yes do you drive?  yes  Function retired I need assistance with the following:  . Do you have any goals in this area?  no  Neuro/Psych No problems in this area  Prior Studies Any changes since last visit?  no EXAM: LUMBAR SPINE - COMPLETE 4+ VIEW   COMPARISON:  MRI of the lumbar spine of January 02, 2014 and lumbar spine series of April 12, 2006.   FINDINGS: The bones are subjectively osteopenic. There is curvature of the lumbar spine convex toward the right centered at L1. The vertebral bodies are preserved in height. There is multilevel degenerative disc space narrowing at the thoracolumbar junction and  throughout the lumbar spine. There is facet joint hypertrophy at L5-S1. The pedicles are intact. The observed portions of the sacrum are normal.   IMPRESSION: There is multilevel degenerative disc disease of the lumbar spine. There is gentle dextrocurvature centered at L1. There is no compression fracture nor significant spondylolisthesis.     Electronically Signed   By: David  Martinique M.D.   On: 10/17/2016 15:25  Physicians involved in your care Any changes since last visit?  no   Family History  Problem Relation Age of Onset  . Hypertension Mother   . Heart disease Mother        CAD MI CABG  . Cancer Mother   . Heart disease Father        CAD MI  . Hypertension Father   . Heart disease Brother        CAD  . Esophageal cancer Brother   . Cancer Maternal Aunt        Breast  . Diabetes Neg Hx   . Colon cancer Neg Hx   . Stomach cancer Neg Hx   . COPD Neg Hx    Social History   Social History  . Marital status: Married    Spouse name: N/A  . Number of children: 1  . Years of education: N/A   Occupational History  . retired    Social History Main Topics  . Smoking  status: Former Smoker    Quit date: 02/16/1981  . Smokeless tobacco: Never Used     Comment: During Adolescence  . Alcohol use 1.0 oz/week    2 Standard drinks or equivalent per week  . Drug use: No  . Sexual activity: Yes    Partners: Male   Other Topics Concern  . 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 Married in 1969, one daughter-'73 and 2 grandchildren.  Work- retired from  [employer] October '12 after 43 years. Life is good.    Past Surgical History:  Procedure Laterality Date  . CERVICAL DISCECTOMY  07/2009   C3-4 with fusion- Dr Janine Ores  . COLONOSCOPY     polypectomy  . NASAL SEPTUM SURGERY  01/2009   [facility]  . SPINE SURGERY    . TUBAL LIGATION     Past Medical History:  Diagnosis Date  . Brachial neuritis or radiculitis NOS   . Cervicalgia   . Degeneration  of cervical intervertebral disc   . Degeneration of lumbar or lumbosacral intervertebral disc   . Dysfunction of eustachian tube   . Osteoporosis 2017  . Spinal stenosis in cervical region   . Unspecified essential hypertension    There were no vitals taken for this visit.  Opioid Risk Score:  7 Fall Risk Score:  `1  Depression screen PHQ 2/9  Depression screen [facility] 2/9 11/24/2016 10/18/2016 10/17/2016 08/11/2015 08/10/2015 07/21/2014 04/24/2013  Decreased Interest 0 0 0 0 0 0 0  Down, Depressed, Hopeless 0 0 0 0 0 0 0  PHQ - 2 Score 0 0 0 0 0 0 0     Review of Systems     Objective:   Physical Exam  Constitutional: She is oriented to person, place, and time. She appears well-developed and well-nourished. No distress.  HENT:  Head: Normocephalic and atraumatic.  Eyes: Pupils are equal, round, and reactive to light. Conjunctivae and EOM are normal.  Neck: Normal range of motion.  Cardiovascular: Normal rate, regular rhythm and normal heart sounds.  Exam reveals no friction rub.   No murmur heard. Musculoskeletal: Normal range of motion. She exhibits no edema.  Neurological: She is alert and oriented to person, place, and time. She displays no atrophy. No cranial nerve deficit. Coordination and gait normal.  Reflex Scores:      Patellar reflexes are 2+ on the right side and 2+ on the left side.      Achilles reflexes are 2+ on the right side and 2+ on the left side. Motor strength is 5/5 bilateral deltoid, biceps, triceps, grip, hip flexor, knee extensor, ankle dorsiflexor and plantar flexor  Ambulates without evidence to drag or knee instability.  Skin: Skin is warm and dry. She is not diaphoretic.  Psychiatric: She has a normal mood and affect. Her behavior is normal. Judgment and thought content normal.  Nursing note and vitals reviewed. . Hip range of motion is normal with internal and external, no pain.        Assessment & Plan:  1. Lumbar pain, greater than five-year  duration with some radiation into the right anterior thigh, no focal neurologic signs. Previous x-rays show a mild dextroscoliosis, previous MRI in 2016, not available in Epic, this was reportedly reviewed by spine surgeon, no surgical intervention was recommended. Patient has not had any conservative care and will make referral to physical therapy. The patient has no improvement over the next month, would consider repeat MRI  Patient  does not wish to have any type of stronger pain medications, she is open to lumbar spine injections if necessary. Given that she has mainly axial back pain, she may be a good candidate for lumbar medial branch blocks.

## 2016-11-24 NOTE — Patient Instructions (Signed)
Referral for PT, recheck in one month if no better then repeat MRI

## 2016-12-11 ENCOUNTER — Telehealth: Payer: Self-pay | Admitting: Physical Medicine & Rehabilitation

## 2016-12-11 NOTE — Telephone Encounter (Signed)
Pt stated she canceled her PT appointments due to sickness. Please advise pt wants to know whether or not she needs to keep the 12/03 appointment with you or cancel it until she has her PT.

## 2016-12-11 NOTE — Telephone Encounter (Signed)
Would need to complete PT, please reschedule ~01/25/17

## 2016-12-25 ENCOUNTER — Ambulatory Visit: Payer: BLUE CROSS/BLUE SHIELD | Admitting: Physical Medicine & Rehabilitation

## 2016-12-26 ENCOUNTER — Encounter: Payer: Self-pay | Admitting: Physical Therapy

## 2016-12-26 ENCOUNTER — Ambulatory Visit: Payer: BLUE CROSS/BLUE SHIELD | Attending: Physical Medicine & Rehabilitation | Admitting: Physical Therapy

## 2016-12-26 DIAGNOSIS — M545 Low back pain, unspecified: Secondary | ICD-10-CM

## 2016-12-26 DIAGNOSIS — G8929 Other chronic pain: Secondary | ICD-10-CM | POA: Diagnosis present

## 2016-12-26 DIAGNOSIS — M62830 Muscle spasm of back: Secondary | ICD-10-CM

## 2016-12-27 NOTE — Therapy (Signed)
[place], [place], [address] Phone: [phone]   Fax:  [phone]  Physical Therapy Evaluation  Patient Details  Name: Vanessa Fuentes MRN: [id number] Date of Birth: 1945/12/12 Referring Provider: Dr Jone Baseman    Encounter Date: 12/26/2016  PT End of Session - 12/27/16 0835    Visit Number  1    Number of Visits  16    Date for PT Re-Evaluation  02/21/17    Authorization Type  BCBS     PT Start Time  0300    PT Stop Time  0355    PT Time Calculation (min)  55 min    Activity Tolerance  Patient tolerated treatment well    Behavior During Therapy  [facility] for tasks assessed/performed       Past Medical History:  Diagnosis Date  . Brachial neuritis or radiculitis NOS   . Cervicalgia   . Degeneration of cervical intervertebral disc   . Degeneration of lumbar or lumbosacral intervertebral disc   . Dysfunction of eustachian tube   . Osteoporosis 2017  . Spinal stenosis in cervical region   . Unspecified essential hypertension     Past Surgical History:  Procedure Laterality Date  . CERVICAL DISCECTOMY  07/2009   C3-4 with fusion- Dr Janine Ores  . COLONOSCOPY     polypectomy  . NASAL SEPTUM SURGERY  01/2009   [facility]  . SPINE SURGERY    . TUBAL LIGATION      There were no vitals filed for this visit.   Subjective Assessment - 12/26/16 1516    Subjective  Patient has had year of lower back pain. The pain is located below her waist line and on both sides. She reports the pain is more aggrevating then anything. She has the most pain when she is sitting.     Limitations  Sitting    How long can you sit comfortably?  < 20 minutes     How long can you stand comfortably?  Standing on hard surfaces     Diagnostic tests  X-ray     Patient Stated Goals  To have less pain in her back     Currently in Pain?  Yes    Pain Score  4     Pain Location  Back    Pain Orientation  Right;Left    Pain  Descriptors / Indicators  Aching    Pain Onset  More than a month ago    Pain Frequency  Constant    Aggravating Factors   standing and walking     Pain Relieving Factors  rest     Effect of Pain on Daily Activities  difficulty perfroming ADL's     Multiple Pain Sites  No         [facility] PT Assessment - 12/27/16 0001      Assessment   Medical Diagnosis  Low Back Pian     Referring Provider  Dr Jone Baseman     Onset Date/Surgical Date  -- For many years     Hand Dominance  Left    Next MD Visit  January 3rd     Prior Therapy  None       Precautions   Precautions  None      Restrictions   Weight Bearing Restrictions  No      Balance Screen   Has the patient fallen in the past 6 months  No  Has the patient had a decrease in activity level because of a fear of falling?   No    Is the patient reluctant to leave their home because of a fear of falling?   No      Home Environment   Additional Comments  Stairs at her house but does not have to go up them. No pain when she goes up the stairs.       Prior Function   Level of Independence  Independent    Vocation  Retired    Leisure  [employer] and went to the gym in the past but does not anymore       Cognition   Overall Cognitive Status  Within Functional Limits for tasks assessed    Attention  Focused    Focused Attention  Appears intact    Memory  Appears intact    Awareness  Appears intact    Problem Solving  Appears intact      Sensation   Additional Comments  Numbness in her right leg which is intermittent       Coordination   Gross Motor Movements are Fluid and Coordinated  Yes    Fine Motor Movements are Fluid and Coordinated  Yes      AROM   Lumbar Flexion  limited 25%     Lumbar Extension  25%     Lumbar - Right Side Bend  No limit     Lumbar - Left Side Bend  no limit     Lumbar - Right Rotation  No limit     Lumbar - Left Rotation  Pain on the right       PROM   Overall PROM Comments  mild tightness  in righthip flexion but able to go to full range       Strength   Overall Strength Comments  fair core contraction with mod cuing     Right/Left Hip  Right;Left    Right Hip Flexion  4+/5    Right Hip ABduction  4+/5    Right Hip ADduction  4+/5    Left Hip Flexion  5/5    Left Hip ABduction  5/5    Left Hip ADduction  5/5    Right/Left Knee  Right;Left    Right Knee Flexion  5/5    Right Knee Extension  5/5    Left Knee Flexion  5/5    Left Knee Extension  5/5      Flexibility   Soft Tissue Assessment /Muscle Length  yes    Hamstrings  90/90 35 degrees right 28 degrees left       Palpation   Spinal mobility  limited PA movement from L3 to L5     Palpation comment  tightness along bilateral       Ambulation/Gait   Gait Comments  No significant gait abnormalities noted.              Objective measurements completed on examination: See above findings.      [facility] Adult PT Treatment/Exercise - 12/27/16 0001      Lumbar Exercises: Stretches   Active Hamstring Stretch Limitations  3x20 sec with strap     Passive Hamstring Stretch Limitations  seated 3x20 sec hold     Piriformis Stretch Limitations  3x20 sec hold       Lumbar Exercises: Supine   AB Set Limitations  abdominal breathing x10 with mod cuing  PT Education - 12/27/16 [phone]    Education provided  Yes    Education Details  HEP, symptom mangement; anatomy of condition;     Person(s) Educated  Patient    Methods  Explanation;Demonstration;Verbal cues;Tactile cues    Comprehension  Verbalized understanding;Returned demonstration;Verbal cues required;Tactile cues required       PT Short Term Goals - 12/27/16 0836      PT SHORT TERM GOAL #1   Title  Patient will be independent with inital HEP     Time  4    Period  Weeks    Status  New    Target Date  01/24/17      PT SHORT TERM GOAL #2   Title  Patient will increase gross bilateral hip strength to 5/5     Time  4    Period  Weeks     Status  New    Target Date  01/24/17      PT SHORT TERM GOAL #3   Title  Patient will improve bilateral hamstring length by 10 degrees.     Time  4    Period  Weeks    Status  New    Target Date  01/24/17        PT Long Term Goals - 12/27/16 0856      PT LONG TERM GOAL #1   Title  Patient will sit in a car for 1 hour without increased pain in order to perfrom go on trips     Time  8    Period  Weeks    Status  New    Target Date  02/21/17      PT LONG TERM GOAL #2   Title  Patient will transfer from sit to stand independent without reports of pain or stiffness     Time  8    Period  Weeks    Status  New    Target Date  02/21/17      PT LONG TERM GOAL #3   Title  Patient will show a 32% limitation on FOTO     Time  8    Period  Weeks    Status  New    Target Date  02/21/17             Plan - 12/27/16 6063    Clinical Impression Statement  Patient is a 71 year old female with low back pain that increases when she sits. She feels stiffness when she transfers sit to stand. She has spasming in her bilateral lumbar paraspinals and tight hamstrings. She has a slight strength deficit on the right. She would  beneift from skilled therapy to improve ability to sit and take trips and to reduce her lower back pain     History and Personal Factors relevant to plan of care:  osteoperoiss    Clinical Presentation  Stable    Clinical Decision Making  Low    Rehab Potential  Good    PT Frequency  2x / week    PT Duration  8 weeks    PT Treatment/Interventions  ADLs/Self Care Home Management;Canalith Repostioning;Cryotherapy;Teaching laboratory technician;Therapeutic exercise;Therapeutic activities;Patient/family education;Manual techniques;Passive range of motion;Taping;Dry needling;Splinting;Moist Heat    PT Next Visit Plan  begin core strengtheing; add supine march calmmshell, ball squeeze; bridge all with abdominal breathing; review  stretching; review ink strethc     PT Home Exercise Plan  abdominal breathing; piriformis stretch; prayer stretch; hamstring stretch seated and  supine     Consulted and Agree with Plan of Care  Patient       Patient will benefit from skilled therapeutic intervention in order to improve the following deficits and impairments:  Pain, Improper body mechanics, Increased muscle spasms, Postural dysfunction, Decreased range of motion, Decreased activity tolerance  Visit Diagnosis: Chronic bilateral low back pain without sciatica  Muscle spasm of back     Problem List Patient Active Problem List   Diagnosis Date Noted  . Chronic midline low back pain 10/17/2016  . Gastroesophageal reflux disease with esophagitis 03/21/2016  . Thrombosed external hemorrhoid 09/20/2015  . Hypercalcemia 08/11/2015  . IBS (irritable bowel syndrome) 07/20/2014  . Osteoporosis 04/24/2013  . Hyperglycemia 04/23/2013  . Hyperlipidemia with target LDL less than 130 04/14/2011  . Preventative health care 08/04/2010  . Essential hypertension 10/13/2008  . DEGENERATIVE DISC DISEASE, LUMBAR SPINE 10/13/2008    Carney Living PT DPT  12/27/2016, 9:13 AM  [facility] [address] [place], [place], [address] Phone: [phone]   Fax:  [phone]  Name: Vanessa Fuentes MRN: [id number] Date of Birth: [birth year]

## 2017-01-05 ENCOUNTER — Ambulatory Visit: Payer: BLUE CROSS/BLUE SHIELD | Admitting: Physical Therapy

## 2017-01-05 DIAGNOSIS — M545 Low back pain, unspecified: Secondary | ICD-10-CM

## 2017-01-05 DIAGNOSIS — M62830 Muscle spasm of back: Secondary | ICD-10-CM

## 2017-01-05 DIAGNOSIS — G8929 Other chronic pain: Secondary | ICD-10-CM

## 2017-01-05 NOTE — Therapy (Addendum)
[place], [place], [address] Phone: [phone]   Fax:  [phone]  Physical Therapy Treatment/ Discharge   Patient Details  Name: Vanessa Fuentes MRN: [id number] Date of Birth: 12-18-1945 Referring Provider: Dr Jone Baseman    Encounter Date: 01/05/2017  PT End of Session - 01/05/17 1111    Visit Number  2    Number of Visits  16    Date for PT Re-Evaluation  02/21/17    Authorization Type  [employer]     PT Start Time  1100    PT Stop Time  1141    PT Time Calculation (min)  41 min    Activity Tolerance  Patient tolerated treatment well    Behavior During Therapy  [facility] for tasks assessed/performed       Past Medical History:  Diagnosis Date  . Brachial neuritis or radiculitis NOS   . Cervicalgia   . Degeneration of cervical intervertebral disc   . Degeneration of lumbar or lumbosacral intervertebral disc   . Dysfunction of eustachian tube   . Osteoporosis 2017  . Spinal stenosis in cervical region   . Unspecified essential hypertension     Past Surgical History:  Procedure Laterality Date  . CERVICAL DISCECTOMY  07/2009   C3-4 with fusion- Dr Janine Ores  . COLONOSCOPY     polypectomy  . NASAL SEPTUM SURGERY  01/2009   [facility]  . SPINE SURGERY    . TUBAL LIGATION      There were no vitals filed for this visit.  Subjective Assessment - 01/05/17 1106    Subjective  Patient reports her back has not hurt over the last week. She lost her sheet so she hasnt been doing all of her exercises just the ones that she cane rememebr.     How long can you sit comfortably?  < 20 minutes     How long can you stand comfortably?  Standing on hard surfaces     Diagnostic tests  X-ray     Patient Stated Goals  To have less pain in her back     Currently in Pain?  No/denies                      [facility] Adult PT Treatment/Exercise - 01/05/17 0001      Lumbar Exercises: Stretches   Active  Hamstring Stretch Limitations  3x20 sec with strap     Passive Hamstring Stretch Limitations  seated 3x20 sec hold     Piriformis Stretch Limitations  3x20 sec hold       Lumbar Exercises: Standing   Other Standing Lumbar Exercises  sink stretch 2x20 sec       Lumbar Exercises: Supine   AB Set Limitations  abdominal breathing x10 with mod cuing     Bridge  10 reps    Other Supine Lumbar Exercises  supine march 2x10; clamshell 2x10; ball queeze 2x10 all with abdominal brace             PT Education - 01/05/17 1108    Education provided  Yes    Education Details  updated HEP; reviewed exercise technique     Person(s) Educated  Patient    Methods  Explanation;Demonstration;Tactile cues;Verbal cues    Comprehension  Verbalized understanding;Returned demonstration;Verbal cues required;Tactile cues required;Need further instruction       PT Short Term Goals - 12/27/16 0836      PT SHORT TERM  GOAL #1   Title  Patient will be independent with inital HEP     Time  4    Period  Weeks    Status  New    Target Date  01/24/17      PT SHORT TERM GOAL #2   Title  Patient will increase gross bilateral hip strength to 5/5     Time  4    Period  Weeks    Status  New    Target Date  01/24/17      PT SHORT TERM GOAL #3   Title  Patient will improve bilateral hamstring length by 10 degrees.     Time  4    Period  Weeks    Status  New    Target Date  01/24/17        PT Long Term Goals - 12/27/16 0856      PT LONG TERM GOAL #1   Title  Patient will sit in a car for 1 hour without increased pain in order to perfrom go on trips     Time  8    Period  Weeks    Status  New    Target Date  02/21/17      PT LONG TERM GOAL #2   Title  Patient will transfer from sit to stand independent without reports of pain or stiffness     Time  8    Period  Weeks    Status  New    Target Date  02/21/17      PT LONG TERM GOAL #3   Title  Patient will show a 32% limitation on FOTO      Time  8    Period  Weeks    Status  New    Target Date  02/21/17            Plan - 01/05/17 1301    Clinical Impression Statement  Patient tolerated treatment well. therapy added in strengthening exercises. therapy also gave her another copy of her other exercises. she was advised to pick 3 stretches and three exercises and try to perfrom 1-2x a day. She may be ready for discharge next visit. FOTO isf she is.     Clinical Presentation  Stable    Clinical Decision Making  Low    Rehab Potential  Good    PT Frequency  2x / week    PT Duration  8 weeks    PT Treatment/Interventions  ADLs/Self Care Home Management;Canalith Repostioning;Cryotherapy;Teaching laboratory technician;Therapeutic exercise;Therapeutic activities;Patient/family education;Manual techniques;Passive range of motion;Taping;Dry needling;Splinting;Moist Heat    PT Next Visit Plan  review HEP     PT Home Exercise Plan  abdominal breathing; piriformis stretch; prayer stretch; hamstring stretch seated and supine        Patient will benefit from skilled therapeutic intervention in order to improve the following deficits and impairments:  Pain, Improper body mechanics, Increased muscle spasms, Postural dysfunction, Decreased range of motion, Decreased activity tolerance  Visit Diagnosis: Chronic bilateral low back pain without sciatica  Muscle spasm of back     Problem List Patient Active Problem List   Diagnosis Date Noted  . Chronic midline low back pain 10/17/2016  . Gastroesophageal reflux disease with esophagitis 03/21/2016  . Thrombosed external hemorrhoid 09/20/2015  . Hypercalcemia 08/11/2015  . IBS (irritable bowel syndrome) 07/20/2014  . Osteoporosis 04/24/2013  . Hyperglycemia 04/23/2013  . Hyperlipidemia with target LDL less than 130 04/14/2011  . Preventative  health care 08/04/2010  . Essential hypertension 10/13/2008  . DEGENERATIVE DISC DISEASE, LUMBAR SPINE  10/13/2008   PHYSICAL THERAPY DISCHARGE SUMMARY  Visits from Start of Care: 2  Current functional level related to goals / functional outcomes: Improved pain    Remaining deficits: Unknown patient did not return    Education / Equipment: HEP  Plan: Patient agrees to discharge.  Patient goals were not met. Patient is being discharged due to not returning since the last visit.  ?????      Carney Living PT DPT  01/05/2017, 1:04 PM  [facility] [address] [place], [place], [address] Phone: [phone]   Fax:  [phone]  Name: Vanessa Fuentes MRN: [id number] Date of Birth: 12-14-1945

## 2017-01-09 ENCOUNTER — Encounter: Payer: BLUE CROSS/BLUE SHIELD | Admitting: Physical Therapy

## 2017-01-10 ENCOUNTER — Ambulatory Visit: Payer: BLUE CROSS/BLUE SHIELD | Admitting: Physical Therapy

## 2017-01-24 ENCOUNTER — Ambulatory Visit: Payer: BLUE CROSS/BLUE SHIELD | Admitting: Physical Therapy

## 2017-01-25 ENCOUNTER — Ambulatory Visit: Payer: BLUE CROSS/BLUE SHIELD | Admitting: Physical Medicine & Rehabilitation

## 2017-04-12 DIAGNOSIS — Z1231 Encounter for screening mammogram for malignant neoplasm of breast: Secondary | ICD-10-CM | POA: Diagnosis not present

## 2017-04-12 DIAGNOSIS — M810 Age-related osteoporosis without current pathological fracture: Secondary | ICD-10-CM | POA: Diagnosis not present

## 2017-04-12 DIAGNOSIS — Z01419 Encounter for gynecological examination (general) (routine) without abnormal findings: Secondary | ICD-10-CM | POA: Diagnosis not present

## 2017-05-07 ENCOUNTER — Other Ambulatory Visit: Payer: Self-pay | Admitting: Internal Medicine

## 2017-05-08 NOTE — Telephone Encounter (Signed)
Pt needs an appt scheduled before we can send in refills.

## 2017-05-09 NOTE — Telephone Encounter (Signed)
appt made

## 2017-05-16 ENCOUNTER — Ambulatory Visit (INDEPENDENT_AMBULATORY_CARE_PROVIDER_SITE_OTHER): Payer: Medicare Other | Admitting: Internal Medicine

## 2017-05-16 ENCOUNTER — Other Ambulatory Visit (INDEPENDENT_AMBULATORY_CARE_PROVIDER_SITE_OTHER): Payer: Medicare Other

## 2017-05-16 ENCOUNTER — Encounter: Payer: Self-pay | Admitting: Internal Medicine

## 2017-05-16 DIAGNOSIS — I10 Essential (primary) hypertension: Secondary | ICD-10-CM | POA: Diagnosis not present

## 2017-05-16 DIAGNOSIS — R739 Hyperglycemia, unspecified: Secondary | ICD-10-CM

## 2017-05-16 LAB — BASIC METABOLIC PANEL
BUN: 11 mg/dL (ref 6–23)
CHLORIDE: 104 meq/L (ref 96–112)
CO2: 27 mEq/L (ref 19–32)
CREATININE: 0.79 mg/dL (ref 0.40–1.20)
Calcium: 9.1 mg/dL (ref 8.4–10.5)
GFR: 76.18 mL/min (ref >60.00)
Glucose, Bld: 92 mg/dL (ref 70–99)
Potassium: 4.5 mEq/L (ref 3.5–5.1)
Sodium: 138 mEq/L (ref 135–145)

## 2017-05-16 MED ORDER — VALSARTAN 160 MG PO TABS: 160.0000 mg | ORAL_TABLET | Freq: Every day | ORAL | 1 refills | Status: DC

## 2017-05-16 NOTE — Patient Instructions (Signed)

## 2017-05-16 NOTE — Progress Notes (Signed)
Subjective:  Patient ID: Vanessa Fuentes, female    DOB: 05/23/1945  Age: 72 y.o. MRN: [id number]  CC: Hypertension   HPI Vanessa Fuentes presents for a BP check - She tells me her blood pressure has been well controlled.  She is active and denies any recent episodes of chest pain, lightheadedness, or dyspnea on exertion.  She feels well and offers no complaints.  Outpatient Medications Prior to Visit  Medication Sig Dispense Refill  . rosuvastatin (CRESTOR) 5 MG tablet Take 1 tablet (5 mg total) by mouth daily. 90 tablet 3  . valsartan (DIOVAN) 160 MG tablet Take 1 tablet (160 mg total) by mouth daily. Annual appt is overdue must see provider for future refills 30 tablet 0  . valsartan (DIOVAN) 160 MG tablet Take 1 tablet (160 mg total) by mouth daily. (Patient not taking: Reported on 12/26/2016) 90 tablet 1  . valsartan (DIOVAN) 160 MG tablet Take 1 tablet (160 mg total) by mouth daily. 30 tablet 0   No facility-administered medications prior to visit.     ROS Review of Systems  Constitutional: Negative for diaphoresis and fatigue.  HENT: Negative.   Eyes: Negative for visual disturbance.  Respiratory: Negative for cough, chest tightness, shortness of breath and wheezing.   Cardiovascular: Negative for chest pain, palpitations and leg swelling.  Gastrointestinal: Negative for abdominal pain, diarrhea, nausea and vomiting.  Genitourinary: Negative.  Negative for difficulty urinating.  Musculoskeletal: Negative.  Negative for arthralgias, back pain and neck pain.  Skin: Negative.  Negative for color change.  Neurological: Negative.  Negative for dizziness, weakness and light-headedness.  Hematological: Negative for adenopathy. Does not bruise/bleed easily.  Psychiatric/Behavioral: Negative.     Objective:  BP 132/84   Pulse 65   Temp 98.5 F (36.9 C) (Oral)   Resp 16   Wt 166 lb (75.3 kg)   SpO2 98%   BMI 29.41 kg/m   BP Readings from Last 3 Encounters:  05/16/17  132/84  11/24/16 119/79  10/30/16 132/86    Wt Readings from Last 3 Encounters:  05/16/17 166 lb (75.3 kg)  10/30/16 165 lb (74.8 kg)  10/17/16 168 lb 2 oz (76.3 kg)    Physical Exam  Constitutional: She is oriented to person, place, and time. No distress.  HENT:  Mouth/Throat: Oropharynx is clear and moist. No oropharyngeal exudate.  Eyes: Conjunctivae are normal. No scleral icterus.  Neck: Normal range of motion. Neck supple. No JVD present. No thyromegaly present.  Cardiovascular: Normal rate, regular rhythm and normal heart sounds. Exam reveals no gallop and no friction rub.  No murmur heard. Pulmonary/Chest: Effort normal and breath sounds normal. No stridor. No respiratory distress. She has no wheezes. She has no rales.  Abdominal: Soft. Bowel sounds are normal. She exhibits no mass. There is no guarding.  Musculoskeletal: Normal range of motion. She exhibits no edema, tenderness or deformity.  Lymphadenopathy:    She has no cervical adenopathy.  Neurological: She is alert and oriented to person, place, and time.  Skin: Skin is warm and dry. She is not diaphoretic.  Vitals reviewed.   Lab Results  Component Value Date   WBC 8.5 10/30/2016   HGB 14.0 10/30/2016   HCT 41.1 10/30/2016   PLT 262.0 10/30/2016   GLUCOSE 92 05/16/2017   CHOL 219 (H) 10/17/2016   TRIG 158.0 (H) 10/17/2016   HDL 58.90 10/17/2016   LDLDIRECT 148.3 04/13/2011   LDLCALC 129 (H) 10/17/2016   ALT 10 10/17/2016  AST 15 10/17/2016   NA 138 05/16/2017   K 4.5 05/16/2017   CL 104 05/16/2017   CREATININE 0.79 05/16/2017   BUN 11 05/16/2017   CO2 27 05/16/2017   TSH 0.88 10/17/2016   HGBA1C 5.0 08/10/2015    Dg Chest 2 View  Result Date: 10/30/2016 CLINICAL DATA:  Cough, chills, fatigue x 10 days, HTN, EXAM: CHEST  2 VIEW COMPARISON:  08/04/2009 FINDINGS: Midline trachea. Normal heart size. Tortuous thoracic aorta. No pleural effusion or pneumothorax. Clear lungs. IMPRESSION: No acute  cardiopulmonary disease. Electronically Signed   By: Abigail Miyamoto M.D.   On: 10/30/2016 12:03    Assessment & Plan:   Vanessa Fuentes was seen today for hypertension.  Diagnoses and all orders for this visit:  Hypercalcemia- Her calcium level is normal now. -     Basic metabolic panel; Future  Hyperglycemia- Improvement noted -     Basic metabolic panel; Future  Essential hypertension- Her blood pressure is well controlled.  Electrolytes and renal function are normal. -     Basic metabolic panel; Future -     valsartan (DIOVAN) 160 MG tablet; Take 1 tablet (160 mg total) by mouth daily. Annual appt is overdue must see provider for future refills   I am having Vanessa Fuentes maintain her rosuvastatin and valsartan.  Meds ordered this encounter  Medications  . valsartan (DIOVAN) 160 MG tablet    Sig: Take 1 tablet (160 mg total) by mouth daily. Annual appt is overdue must see provider for future refills    Dispense:  90 tablet    Refill:  1     Follow-up: Return in about 6 months (around 11/15/2017).  Scarlette Calico, MD

## 2017-08-16 DIAGNOSIS — L570 Actinic keratosis: Secondary | ICD-10-CM | POA: Diagnosis not present

## 2017-08-16 DIAGNOSIS — D2271 Melanocytic nevi of right lower limb, including hip: Secondary | ICD-10-CM | POA: Diagnosis not present

## 2017-08-16 DIAGNOSIS — L820 Inflamed seborrheic keratosis: Secondary | ICD-10-CM | POA: Diagnosis not present

## 2017-08-16 DIAGNOSIS — L821 Other seborrheic keratosis: Secondary | ICD-10-CM | POA: Diagnosis not present

## 2017-10-23 ENCOUNTER — Telehealth: Payer: Self-pay | Admitting: Internal Medicine

## 2017-10-23 DIAGNOSIS — I10 Essential (primary) hypertension: Secondary | ICD-10-CM

## 2017-10-23 DIAGNOSIS — E785 Hyperlipidemia, unspecified: Secondary | ICD-10-CM

## 2017-10-24 NOTE — Telephone Encounter (Signed)
Pt is due for follow up before I can send in a refill. My chart sent to pt informing of same.

## 2017-10-29 ENCOUNTER — Other Ambulatory Visit: Payer: Self-pay | Admitting: Internal Medicine

## 2017-10-29 DIAGNOSIS — E785 Hyperlipidemia, unspecified: Secondary | ICD-10-CM

## 2017-10-29 MED ORDER — VALSARTAN 160 MG PO TABS: 160.0000 mg | ORAL_TABLET | Freq: Every day | ORAL | 0 refills | Status: DC

## 2017-10-29 MED ORDER — ROSUVASTATIN CALCIUM 5 MG PO TABS: 5.0000 mg | ORAL_TABLET | Freq: Every day | ORAL | 0 refills | Status: DC

## 2017-10-29 NOTE — Addendum Note (Signed)
Addended by: Aviva Signs M on: 10/29/2017 02:11 PM   Modules accepted: Orders

## 2017-10-29 NOTE — Telephone Encounter (Signed)
Erx has been sent in as requested.

## 2017-10-29 NOTE — Telephone Encounter (Signed)
Pt has scheduled cpe on Nov 5 at 8 am. Can you send in 30 day of rosuvastatin (CRESTOR) 5 MG tablet To get her through to appt?  [facility] [phone] - [place], [place] - [address] AT [place] [phone] (Phone) [phone] (Fax

## 2017-11-27 ENCOUNTER — Ambulatory Visit (INDEPENDENT_AMBULATORY_CARE_PROVIDER_SITE_OTHER): Payer: Medicare Other | Admitting: Internal Medicine

## 2017-11-27 ENCOUNTER — Other Ambulatory Visit (INDEPENDENT_AMBULATORY_CARE_PROVIDER_SITE_OTHER): Payer: Medicare Other

## 2017-11-27 ENCOUNTER — Encounter: Payer: Self-pay | Admitting: Internal Medicine

## 2017-11-27 VITALS — BP 128/70 | HR 73 | Temp 98.2°F | Resp 16 | Ht 63.0 in | Wt 170.0 lb

## 2017-11-27 DIAGNOSIS — Z23 Encounter for immunization: Secondary | ICD-10-CM

## 2017-11-27 DIAGNOSIS — K2100 Gastro-esophageal reflux disease with esophagitis, without bleeding: Secondary | ICD-10-CM

## 2017-11-27 DIAGNOSIS — E785 Hyperlipidemia, unspecified: Secondary | ICD-10-CM

## 2017-11-27 DIAGNOSIS — Z0000 Encounter for general adult medical examination without abnormal findings: Secondary | ICD-10-CM | POA: Diagnosis not present

## 2017-11-27 DIAGNOSIS — I10 Essential (primary) hypertension: Secondary | ICD-10-CM

## 2017-11-27 DIAGNOSIS — Z1231 Encounter for screening mammogram for malignant neoplasm of breast: Secondary | ICD-10-CM

## 2017-11-27 LAB — LIPID PANEL
CHOL/HDL RATIO: 2
Cholesterol: 160 mg/dL (ref 0–200)
HDL: 65.9 mg/dL (ref >39.00)
LDL CALC: 78 mg/dL (ref 0–99)
NonHDL: 94.5
Triglycerides: 81 mg/dL (ref 0.0–149.0)
VLDL: 16.2 mg/dL (ref 0.0–40.0)

## 2017-11-27 LAB — CBC WITH DIFFERENTIAL/PLATELET
BASOS PCT: 1.1 % (ref 0.0–3.0)
Basophils Absolute: 0.1 10*3/uL (ref 0.0–0.1)
EOS ABS: 0.2 10*3/uL (ref 0.0–0.7)
Eosinophils Relative: 3.4 % (ref 0.0–5.0)
HCT: 41.8 % (ref 36.0–46.0)
HEMOGLOBIN: 14.3 g/dL (ref 12.0–15.0)
Lymphocytes Relative: 25.4 % (ref 12.0–46.0)
Lymphs Abs: 1.5 10*3/uL (ref 0.7–4.0)
MCHC: 34.2 g/dL (ref 30.0–36.0)
MCV: 90.3 fl (ref 78.0–100.0)
MONO ABS: 0.3 10*3/uL (ref 0.1–1.0)
Monocytes Relative: 6 % (ref 3.0–12.0)
Neutro Abs: 3.7 10*3/uL (ref 1.4–7.7)
Neutrophils Relative %: 64.1 % (ref 43.0–77.0)
Platelets: 169 10*3/uL (ref 150.0–400.0)
RBC: 4.63 Mil/uL (ref 3.87–5.11)
RDW: 12.9 % (ref 11.5–15.5)
WBC: 5.7 10*3/uL (ref 4.0–10.5)

## 2017-11-27 LAB — COMPREHENSIVE METABOLIC PANEL
ALBUMIN: 4.3 g/dL (ref 3.5–5.2)
ALK PHOS: 46 U/L (ref 39–117)
ALT: 18 U/L (ref 0–35)
AST: 17 U/L (ref 0–37)
BILIRUBIN TOTAL: 0.6 mg/dL (ref 0.2–1.2)
BUN: 14 mg/dL (ref 6–23)
CALCIUM: 9.2 mg/dL (ref 8.4–10.5)
CO2: 26 mEq/L (ref 19–32)
Chloride: 103 mEq/L (ref 96–112)
Creatinine, Ser: 0.94 mg/dL (ref 0.40–1.20)
GFR: 62.24 mL/min (ref >60.00)
Glucose, Bld: 100 mg/dL — ABNORMAL HIGH (ref 70–99)
POTASSIUM: 4.2 meq/L (ref 3.5–5.1)
Sodium: 137 mEq/L (ref 135–145)
Total Protein: 6.7 g/dL (ref 6.0–8.3)

## 2017-11-27 LAB — TSH: TSH: 1.03 u[IU]/mL (ref 0.35–4.50)

## 2017-11-27 NOTE — Patient Instructions (Signed)

## 2017-11-27 NOTE — Progress Notes (Signed)
Subjective:  Patient ID: Vanessa Fuentes, female    DOB: 1945/12/19  Age: 72 y.o. MRN: [id number]  CC: Annual Exam; Hyperlipidemia; and Hypertension   HPI Vanessa Fuentes presents for a CPX.  She feels well and offers no complaints.  She has rare episodes of constipation which responds well to OTC meds.  She is very active and denies any recent episodes of CP, DOE, palpitations, edema, or fatigue.  Past Medical History:  Diagnosis Date  . Brachial neuritis or radiculitis NOS   . Cervicalgia   . Degeneration of cervical intervertebral disc   . Degeneration of lumbar or lumbosacral intervertebral disc   . Dysfunction of eustachian tube   . Osteoporosis 2017  . Spinal stenosis in cervical region   . Unspecified essential hypertension    Past Surgical History:  Procedure Laterality Date  . CERVICAL DISCECTOMY  07/2009   C3-4 with fusion- Dr Janine Ores  . COLONOSCOPY     polypectomy  . NASAL SEPTUM SURGERY  01/2009   [facility]  . SPINE SURGERY    . TUBAL LIGATION      reports that she quit smoking about 36 years ago. She has never used smokeless tobacco. She reports that she drinks about 2.0 standard drinks of alcohol per week. She reports that she does not use drugs. family history includes Cancer in her maternal aunt and mother; Esophageal cancer in her brother; Heart disease in her brother, father, and mother; Hypertension in her father and mother. Allergies  Allergen Reactions  . Sulfonamide Derivatives     Blisters eyes    Outpatient Medications Prior to Visit  Medication Sig Dispense Refill  . alendronate (FOSAMAX) 70 MG tablet alendronate 70 mg tablet  TK 1 T PO Q WK    . rosuvastatin (CRESTOR) 5 MG tablet TAKE 1 TABLET(5 MG) BY MOUTH DAILY 90 tablet 0  . valsartan (DIOVAN) 160 MG tablet Take 1 tablet (160 mg total) by mouth daily. Annual appt is overdue must see provider for future refills 30 tablet 0   No facility-administered medications prior to visit.      ROS Review of Systems  Constitutional: Negative.  Negative for diaphoresis, fatigue and unexpected weight change.  HENT: Negative.   Eyes: Negative for visual disturbance.  Respiratory: Negative for cough, chest tightness, shortness of breath and wheezing.   Cardiovascular: Negative for chest pain and leg swelling.  Gastrointestinal: Positive for constipation. Negative for abdominal distention, abdominal pain, nausea and vomiting.  Genitourinary: Negative.  Negative for difficulty urinating and dysuria.  Musculoskeletal: Negative.  Negative for arthralgias, joint swelling and myalgias.  Skin: Negative.  Negative for color change and rash.  Neurological: Negative.  Negative for dizziness, weakness, light-headedness and headaches.  Hematological: Negative for adenopathy. Does not bruise/bleed easily.  Psychiatric/Behavioral: Negative.     Objective:  BP 128/70 (BP Location: Left Arm, Patient Position: Sitting, Cuff Size: Normal)   Pulse 73   Temp 98.2 F (36.8 C) (Oral)   Resp 16   Ht 5\' 3"  (1.6 m)   Wt 170 lb (77.1 kg)   SpO2 95%   BMI 30.11 kg/m   BP Readings from Last 3 Encounters:  11/27/17 128/70  05/16/17 132/84  11/24/16 119/79    Wt Readings from Last 3 Encounters:  11/27/17 170 lb (77.1 kg)  05/16/17 166 lb (75.3 kg)  10/30/16 165 lb (74.8 kg)    Physical Exam  Constitutional: She is oriented to person, place, and time. No distress.  HENT:  Mouth/Throat: Oropharynx is clear and moist. No oropharyngeal exudate.  Eyes: Conjunctivae are normal. No scleral icterus.  Neck: Normal range of motion. Neck supple. No JVD present. No thyromegaly present.  Cardiovascular: Normal rate, regular rhythm and normal heart sounds. Exam reveals no gallop.  No murmur heard. Pulmonary/Chest: Effort normal and breath sounds normal. No respiratory distress. She has no wheezes. She has no rales.  Abdominal: Normal appearance and bowel sounds are normal. There is no  hepatosplenomegaly. There is no tenderness.  Musculoskeletal: Normal range of motion. She exhibits no edema, tenderness or deformity.  Lymphadenopathy:    She has no cervical adenopathy.  Neurological: She is alert and oriented to person, place, and time.  Skin: Skin is warm and dry. She is not diaphoretic. No pallor.  Psychiatric: She has a normal mood and affect. Her behavior is normal. Judgment and thought content normal.  Vitals reviewed.   Lab Results  Component Value Date   WBC 5.7 11/27/2017   HGB 14.3 11/27/2017   HCT 41.8 11/27/2017   PLT 169.0 11/27/2017   GLUCOSE 100 (H) 11/27/2017   CHOL 160 11/27/2017   TRIG 81.0 11/27/2017   HDL 65.90 11/27/2017   LDLDIRECT 148.3 04/13/2011   LDLCALC 78 11/27/2017   ALT 18 11/27/2017   AST 17 11/27/2017   NA 137 11/27/2017   K 4.2 11/27/2017   CL 103 11/27/2017   CREATININE 0.94 11/27/2017   BUN 14 11/27/2017   CO2 26 11/27/2017   TSH 1.03 11/27/2017   HGBA1C 5.0 08/10/2015    Dg Chest 2 View  Result Date: 10/30/2016 CLINICAL DATA:  Cough, chills, fatigue x 10 days, HTN, EXAM: CHEST  2 VIEW COMPARISON:  08/04/2009 FINDINGS: Midline trachea. Normal heart size. Tortuous thoracic aorta. No pleural effusion or pneumothorax. Clear lungs. IMPRESSION: No acute cardiopulmonary disease. Electronically Signed   By: Abigail Miyamoto M.D.   On: 10/30/2016 12:03    Assessment & Plan:   [name] was seen today for annual exam, hyperlipidemia and hypertension.  Diagnoses and all orders for this visit:  Essential hypertension- Her blood pressure is well controlled.  Electrolytes and renal function are normal.  Will continue the current dose of the ARB. -     TSH; Future -     valsartan (DIOVAN) 160 MG tablet; Take 1 tablet (160 mg total) by mouth daily.  Gastroesophageal reflux disease with esophagitis- She is asymptomatic with this and no complications noted. -     CBC with Differential/Platelet; Future  Hypercalcemia- Her calcium level  is normal now.  I will monitor her for hyperparathyroidism. -     Comprehensive metabolic panel; Future -     PTH, intact and calcium; Future  Hyperlipidemia with target LDL less than 130- She has achieved her LDL goal and is doing well on the statin. -     Lipid panel; Future -     TSH; Future -     rosuvastatin (CRESTOR) 5 MG tablet; TAKE 1 TABLET(5 MG) BY MOUTH DAILY  Preventative health care  Need for influenza vaccination -     Flu vaccine HIGH DOSE PF (Fluzone High dose)  Visit for screening mammogram -     MM DIGITAL SCREENING BILATERAL; Future   I have changed Mardene Celeste E. Rineer's valsartan. I am also having her maintain her alendronate and rosuvastatin.  Meds ordered this encounter  Medications  . valsartan (DIOVAN) 160 MG tablet    Sig: Take 1 tablet (160 mg total) by mouth daily.  Dispense:  90 tablet    Refill:  1  . rosuvastatin (CRESTOR) 5 MG tablet    Sig: TAKE 1 TABLET(5 MG) BY MOUTH DAILY    Dispense:  90 tablet    Refill:  1   See AVS for instructions about healthy living and anticipatory guidance.  Follow-up: Return in about 6 months (around 05/28/2018).  Scarlette Calico, MD

## 2017-11-28 ENCOUNTER — Encounter: Payer: Self-pay | Admitting: Internal Medicine

## 2017-11-28 MED ORDER — ROSUVASTATIN CALCIUM 5 MG PO TABS: ORAL_TABLET | ORAL | 1 refills | Status: DC

## 2017-11-28 MED ORDER — VALSARTAN 160 MG PO TABS: 160.0000 mg | ORAL_TABLET | Freq: Every day | ORAL | 1 refills | Status: DC

## 2017-11-28 NOTE — Assessment & Plan Note (Signed)

## 2017-11-29 LAB — PTH, INTACT AND CALCIUM
Calcium: 9.1 mg/dL (ref 8.6–10.4)
PTH: 62 pg/mL (ref 14–64)

## 2018-05-28 DIAGNOSIS — M5412 Radiculopathy, cervical region: Secondary | ICD-10-CM | POA: Diagnosis not present

## 2018-05-29 IMAGING — DX DG CHEST 2V
2 series · 2 of 2 positions shown · non-contrast
Comparison: 08/04/2009

CLINICAL DATA: Cough, chills, fatigue x 10 days, HTN,

EXAM:
CHEST  2 VIEW

[chest pa]
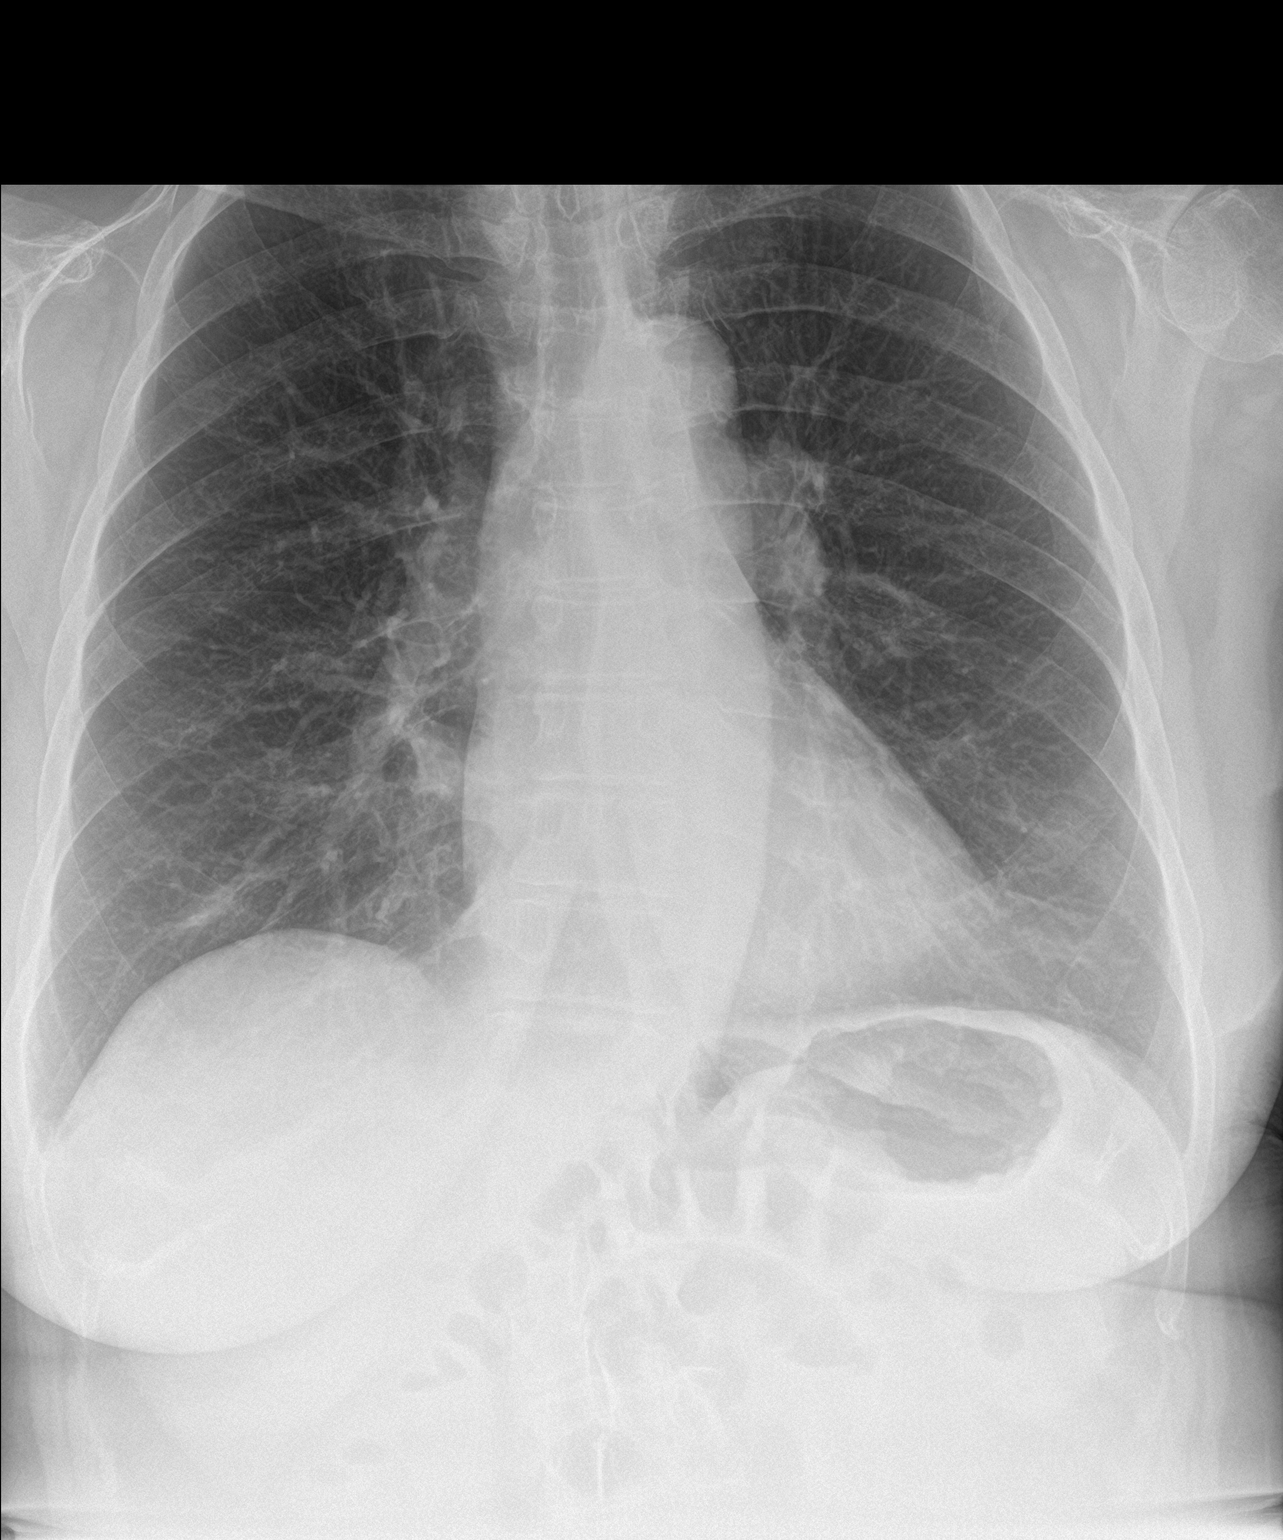

[chest lat]
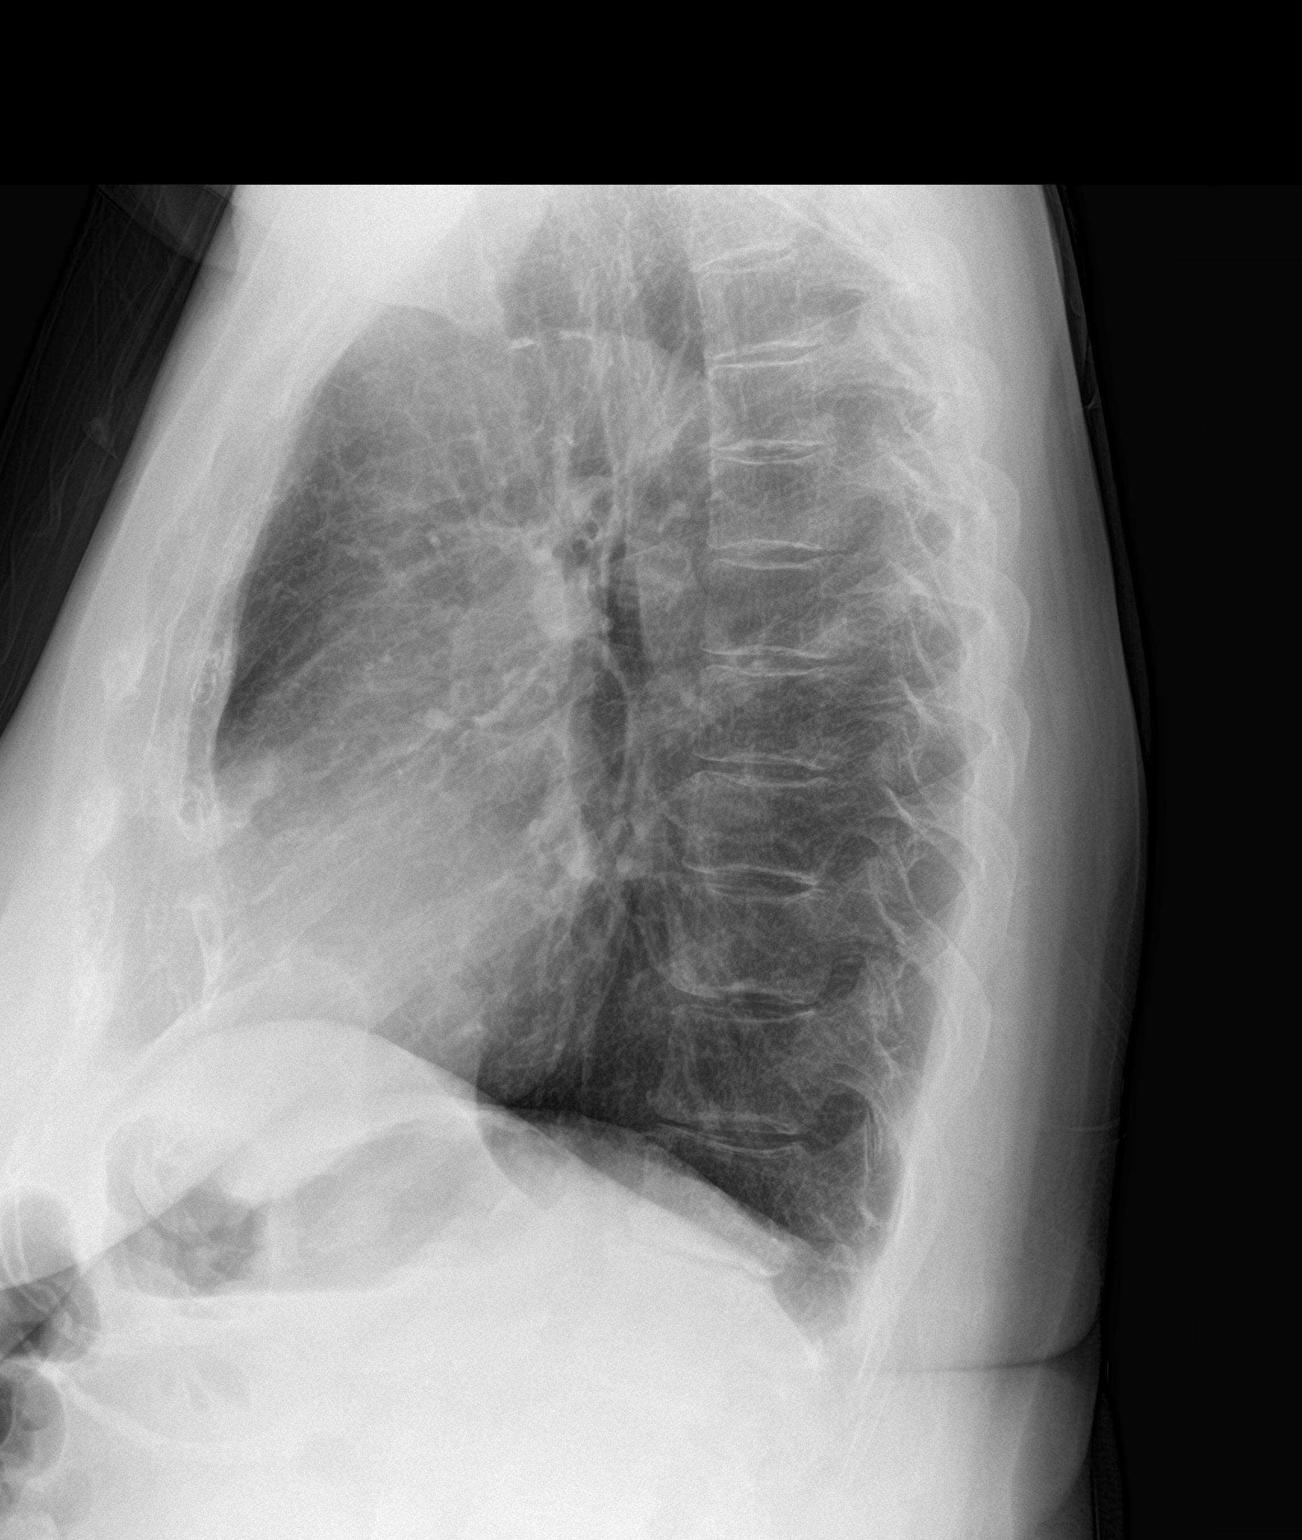

[2 of 2 positions shown; findings below may reference images not displayed]

FINDINGS: Midline trachea. Normal heart size. Tortuous thoracic aorta. No
pleural effusion or pneumothorax. Clear lungs.
IMPRESSION: No acute cardiopulmonary disease.

## 2018-05-30 ENCOUNTER — Other Ambulatory Visit: Payer: Self-pay | Admitting: Internal Medicine

## 2018-05-30 DIAGNOSIS — I10 Essential (primary) hypertension: Secondary | ICD-10-CM

## 2018-06-11 DIAGNOSIS — M5416 Radiculopathy, lumbar region: Secondary | ICD-10-CM | POA: Diagnosis not present

## 2018-06-11 DIAGNOSIS — M25551 Pain in right hip: Secondary | ICD-10-CM | POA: Diagnosis not present

## 2018-06-13 DIAGNOSIS — M5416 Radiculopathy, lumbar region: Secondary | ICD-10-CM | POA: Insufficient documentation

## 2018-07-08 DIAGNOSIS — K1329 Other disturbances of oral epithelium, including tongue: Secondary | ICD-10-CM | POA: Diagnosis not present

## 2018-07-23 DIAGNOSIS — K1329 Other disturbances of oral epithelium, including tongue: Secondary | ICD-10-CM | POA: Diagnosis not present

## 2018-07-26 ENCOUNTER — Other Ambulatory Visit: Payer: Self-pay | Admitting: Internal Medicine

## 2018-07-26 DIAGNOSIS — E785 Hyperlipidemia, unspecified: Secondary | ICD-10-CM

## 2018-07-29 ENCOUNTER — Other Ambulatory Visit: Payer: Self-pay | Admitting: Internal Medicine

## 2018-07-29 DIAGNOSIS — I10 Essential (primary) hypertension: Secondary | ICD-10-CM

## 2018-07-29 MED ORDER — VALSARTAN 160 MG PO TABS: ORAL_TABLET | ORAL | 0 refills | Status: DC

## 2018-08-15 DIAGNOSIS — K1379 Other lesions of oral mucosa: Secondary | ICD-10-CM | POA: Diagnosis not present

## 2018-08-15 DIAGNOSIS — L859 Epidermal thickening, unspecified: Secondary | ICD-10-CM | POA: Diagnosis not present

## 2018-08-15 DIAGNOSIS — J029 Acute pharyngitis, unspecified: Secondary | ICD-10-CM | POA: Diagnosis not present

## 2018-08-19 DIAGNOSIS — Z1231 Encounter for screening mammogram for malignant neoplasm of breast: Secondary | ICD-10-CM | POA: Diagnosis not present

## 2018-08-19 LAB — HM MAMMOGRAPHY

## 2018-08-25 DIAGNOSIS — H35359 Cystoid macular degeneration, unspecified eye: Secondary | ICD-10-CM | POA: Diagnosis not present

## 2018-08-27 ENCOUNTER — Encounter: Payer: Self-pay | Admitting: Internal Medicine

## 2018-09-02 DIAGNOSIS — H35341 Macular cyst, hole, or pseudohole, right eye: Secondary | ICD-10-CM | POA: Diagnosis not present

## 2018-09-02 DIAGNOSIS — H35362 Drusen (degenerative) of macula, left eye: Secondary | ICD-10-CM | POA: Diagnosis not present

## 2018-09-02 DIAGNOSIS — H43823 Vitreomacular adhesion, bilateral: Secondary | ICD-10-CM | POA: Diagnosis not present

## 2018-09-02 DIAGNOSIS — H43813 Vitreous degeneration, bilateral: Secondary | ICD-10-CM | POA: Diagnosis not present

## 2018-09-26 DIAGNOSIS — L565 Disseminated superficial actinic porokeratosis (DSAP): Secondary | ICD-10-CM | POA: Diagnosis not present

## 2018-09-26 DIAGNOSIS — B353 Tinea pedis: Secondary | ICD-10-CM | POA: Diagnosis not present

## 2018-09-26 DIAGNOSIS — L821 Other seborrheic keratosis: Secondary | ICD-10-CM | POA: Diagnosis not present

## 2018-09-26 DIAGNOSIS — L570 Actinic keratosis: Secondary | ICD-10-CM | POA: Diagnosis not present

## 2018-10-02 DIAGNOSIS — H35341 Macular cyst, hole, or pseudohole, right eye: Secondary | ICD-10-CM | POA: Diagnosis not present

## 2018-10-15 ENCOUNTER — Other Ambulatory Visit: Payer: Self-pay | Admitting: Internal Medicine

## 2018-10-15 DIAGNOSIS — E785 Hyperlipidemia, unspecified: Secondary | ICD-10-CM

## 2018-10-15 MED ORDER — ROSUVASTATIN CALCIUM 5 MG PO TABS: 5.0000 mg | ORAL_TABLET | Freq: Every day | ORAL | 0 refills | Status: DC

## 2018-10-31 DIAGNOSIS — H43822 Vitreomacular adhesion, left eye: Secondary | ICD-10-CM | POA: Diagnosis not present

## 2018-11-12 DIAGNOSIS — Z23 Encounter for immunization: Secondary | ICD-10-CM | POA: Diagnosis not present

## 2018-11-22 ENCOUNTER — Telehealth: Payer: Self-pay

## 2018-11-22 ENCOUNTER — Other Ambulatory Visit: Payer: Self-pay | Admitting: Internal Medicine

## 2018-11-22 DIAGNOSIS — I10 Essential (primary) hypertension: Secondary | ICD-10-CM

## 2018-11-22 MED ORDER — VALSARTAN 160 MG PO TABS: ORAL_TABLET | ORAL | 0 refills | Status: DC

## 2018-11-22 NOTE — Telephone Encounter (Signed)
Rf rq for valsartan. Pt contacted and agreed to schedule for 11/27/2018 at 3:40pm.   Please advise if rx can be sent in.

## 2018-11-27 ENCOUNTER — Encounter: Payer: Self-pay | Admitting: Internal Medicine

## 2018-11-27 ENCOUNTER — Other Ambulatory Visit: Payer: Self-pay

## 2018-11-27 ENCOUNTER — Ambulatory Visit (INDEPENDENT_AMBULATORY_CARE_PROVIDER_SITE_OTHER): Payer: Medicare Other | Admitting: Internal Medicine

## 2018-11-27 ENCOUNTER — Other Ambulatory Visit (INDEPENDENT_AMBULATORY_CARE_PROVIDER_SITE_OTHER): Payer: Medicare Other

## 2018-11-27 VITALS — BP 136/78 | HR 70 | Temp 98.4°F | Resp 16 | Ht 63.0 in | Wt 165.2 lb

## 2018-11-27 DIAGNOSIS — I10 Essential (primary) hypertension: Secondary | ICD-10-CM

## 2018-11-27 DIAGNOSIS — E785 Hyperlipidemia, unspecified: Secondary | ICD-10-CM

## 2018-11-27 LAB — HEPATIC FUNCTION PANEL
ALT: 12 U/L (ref 0–35)
AST: 14 U/L (ref 0–37)
Albumin: 4.3 g/dL (ref 3.5–5.2)
Alkaline Phosphatase: 49 U/L (ref 39–117)
Bilirubin, Direct: 0.1 mg/dL (ref 0.0–0.3)
Total Bilirubin: 0.4 mg/dL (ref 0.2–1.2)
Total Protein: 6.6 g/dL (ref 6.0–8.3)

## 2018-11-27 LAB — BASIC METABOLIC PANEL
BUN: 15 mg/dL (ref 6–23)
CO2: 27 mEq/L (ref 19–32)
Calcium: 9.3 mg/dL (ref 8.4–10.5)
Chloride: 104 mEq/L (ref 96–112)
Creatinine, Ser: 0.79 mg/dL (ref 0.40–1.20)
GFR: 71.37 mL/min (ref >60.00)
Glucose, Bld: 107 mg/dL — ABNORMAL HIGH (ref 70–99)
Potassium: 3.9 mEq/L (ref 3.5–5.1)
Sodium: 138 mEq/L (ref 135–145)

## 2018-11-27 LAB — LIPID PANEL
Cholesterol: 156 mg/dL (ref 0–200)
HDL: 60 mg/dL (ref >39.00)
LDL Cholesterol: 76 mg/dL (ref 0–99)
NonHDL: 96.22
Total CHOL/HDL Ratio: 3
Triglycerides: 103 mg/dL (ref 0.0–149.0)
VLDL: 20.6 mg/dL (ref 0.0–40.0)

## 2018-11-27 LAB — CBC WITH DIFFERENTIAL/PLATELET
Basophils Absolute: 0.1 10*3/uL (ref 0.0–0.1)
Basophils Relative: 1.1 % (ref 0.0–3.0)
Eosinophils Absolute: 0.2 10*3/uL (ref 0.0–0.7)
Eosinophils Relative: 2.3 % (ref 0.0–5.0)
HCT: 40.2 % (ref 36.0–46.0)
Hemoglobin: 13.6 g/dL (ref 12.0–15.0)
Lymphocytes Relative: 30.5 % (ref 12.0–46.0)
Lymphs Abs: 2 10*3/uL (ref 0.7–4.0)
MCHC: 33.8 g/dL (ref 30.0–36.0)
MCV: 91.7 fl (ref 78.0–100.0)
Monocytes Absolute: 0.5 10*3/uL (ref 0.1–1.0)
Monocytes Relative: 7 % (ref 3.0–12.0)
Neutro Abs: 3.9 10*3/uL (ref 1.4–7.7)
Neutrophils Relative %: 59.1 % (ref 43.0–77.0)
Platelets: 176 10*3/uL (ref 150.0–400.0)
RBC: 4.39 Mil/uL (ref 3.87–5.11)
RDW: 12.5 % (ref 11.5–15.5)
WBC: 6.6 10*3/uL (ref 4.0–10.5)

## 2018-11-27 LAB — TSH: TSH: 1.03 u[IU]/mL (ref 0.35–4.50)

## 2018-11-27 NOTE — Progress Notes (Signed)
Subjective:  Patient ID: Vanessa Fuentes, female    DOB: 17-Jun-1945  Age: 73 y.o. MRN: [id number]  CC: Hypertension and Hyperlipidemia   HPI Vanessa Fuentes presents for f/up - She tells me her blood pressure has been well controlled.  She has felt well recently and offers no complaints.  Outpatient Medications Prior to Visit  Medication Sig Dispense Refill  . alendronate (FOSAMAX) 70 MG tablet alendronate 70 mg tablet  TK 1 T PO Q WK    . rosuvastatin (CRESTOR) 5 MG tablet Take 1 tablet (5 mg total) by mouth at bedtime. 90 tablet 0  . valsartan (DIOVAN) 160 MG tablet TAKE 1 TABLET(160 MG) BY MOUTH DAILY 90 tablet 0   No facility-administered medications prior to visit.     ROS Review of Systems  Constitutional: Negative for chills, diaphoresis, fatigue and fever.  HENT: Negative.   Eyes: Negative.   Respiratory: Negative for cough, chest tightness, shortness of breath and wheezing.   Cardiovascular: Negative for chest pain, palpitations and leg swelling.  Gastrointestinal: Negative for abdominal pain, diarrhea, nausea and vomiting.  Endocrine: Negative.   Genitourinary: Negative.  Negative for difficulty urinating.  Musculoskeletal: Negative.  Negative for arthralgias and myalgias.  Skin: Negative.  Negative for color change and pallor.  Neurological: Negative.  Negative for dizziness, weakness, light-headedness and headaches.  Hematological: Negative for adenopathy. Does not bruise/bleed easily.  Psychiatric/Behavioral: Negative.     Objective:  BP 136/78 (BP Location: Left Arm, Patient Position: Sitting, Cuff Size: Normal)   Pulse 70   Temp 98.4 F (36.9 C) (Oral)   Resp 16   Ht 5\' 3"  (1.6 m)   Wt 165 lb 4 oz (75 kg)   SpO2 96%   BMI 29.27 kg/m   BP Readings from Last 3 Encounters:  11/27/18 136/78  11/27/17 128/70  05/16/17 132/84    Wt Readings from Last 3 Encounters:  11/27/18 165 lb 4 oz (75 kg)  11/27/17 170 lb (77.1 kg)  05/16/17 166 lb (75.3  kg)    Physical Exam Constitutional:      Appearance: Normal appearance.  HENT:     Nose: Nose normal. No rhinorrhea.     Mouth/Throat:     Mouth: Mucous membranes are moist.  Eyes:     General: No scleral icterus.    Conjunctiva/sclera: Conjunctivae normal.  Neck:     Musculoskeletal: Neck supple.  Cardiovascular:     Rate and Rhythm: Normal rate and regular rhythm.     Heart sounds: No murmur. No gallop.   Pulmonary:     Effort: Pulmonary effort is normal.     Breath sounds: No stridor. No wheezing, rhonchi or rales.  Abdominal:     General: Abdomen is flat. Bowel sounds are normal. There is no distension.     Palpations: Abdomen is soft. There is no hepatomegaly or splenomegaly.     Tenderness: There is no abdominal tenderness.  Musculoskeletal: Normal range of motion.     Right lower leg: No edema.     Left lower leg: No edema.  Lymphadenopathy:     Cervical: No cervical adenopathy.  Skin:    General: Skin is warm and dry.     Coloration: Skin is not pale.  Neurological:     General: No focal deficit present.     Mental Status: She is alert.  Psychiatric:        Mood and Affect: Mood normal.        Behavior:  Behavior normal.     Lab Results  Component Value Date   WBC 6.6 11/27/2018   HGB 13.6 11/27/2018   HCT 40.2 11/27/2018   PLT 176.0 11/27/2018   GLUCOSE 107 (H) 11/27/2018   CHOL 156 11/27/2018   TRIG 103.0 11/27/2018   HDL 60.00 11/27/2018   LDLDIRECT 148.3 04/13/2011   LDLCALC 76 11/27/2018   ALT 12 11/27/2018   AST 14 11/27/2018   NA 138 11/27/2018   K 3.9 11/27/2018   CL 104 11/27/2018   CREATININE 0.79 11/27/2018   BUN 15 11/27/2018   CO2 27 11/27/2018   TSH 1.03 11/27/2018   HGBA1C 5.0 08/10/2015    Dg Chest 2 View  Result Date: 10/30/2016 CLINICAL DATA:  Cough, chills, fatigue x 10 days, HTN, EXAM: CHEST  2 VIEW COMPARISON:  08/04/2009 FINDINGS: Midline trachea. Normal heart size. Tortuous thoracic aorta. No pleural effusion or  pneumothorax. Clear lungs. IMPRESSION: No acute cardiopulmonary disease. Electronically Signed   By: Abigail Miyamoto M.D.   On: 10/30/2016 12:03    Assessment & Plan:   Vanessa Fuentes was seen today for hypertension and hyperlipidemia.  Diagnoses and all orders for this visit:  Essential hypertension- Her blood pressure is adequately well controlled.  Electrolytes and renal function are normal. -     CBC with Differential; Future -     TSH; Future  Hypercalcemia- Her calcium level remains normal. -     Basic metabolic panel; Future -     PTH, intact and calcium; Future  Hyperlipidemia with target LDL less than 130- She has achieved her LDL goal and is doing well on the statin. -     Lipid panel; Future -     TSH; Future -     Hepatic function panel; Future   I am having Vanessa Fuentes maintain her alendronate, rosuvastatin, and valsartan.  No orders of the defined types were placed in this encounter.    Follow-up: Return in about 6 months (around 05/27/2019).  Scarlette Calico, MD

## 2018-11-27 NOTE — Patient Instructions (Signed)

## 2018-11-28 ENCOUNTER — Encounter: Payer: Self-pay | Admitting: Internal Medicine

## 2018-11-29 LAB — PTH, INTACT AND CALCIUM
Calcium: 9.5 mg/dL (ref 8.6–10.4)
PTH: 21 pg/mL (ref 14–64)

## 2018-12-16 ENCOUNTER — Telehealth: Payer: Self-pay | Admitting: Internal Medicine

## 2018-12-16 NOTE — Telephone Encounter (Signed)
Patient declined AWV at this time. SF °

## 2018-12-18 ENCOUNTER — Other Ambulatory Visit: Payer: Self-pay

## 2019-01-28 ENCOUNTER — Telehealth: Payer: Self-pay | Admitting: Internal Medicine

## 2019-01-28 ENCOUNTER — Other Ambulatory Visit: Payer: Self-pay | Admitting: Internal Medicine

## 2019-01-28 DIAGNOSIS — E785 Hyperlipidemia, unspecified: Secondary | ICD-10-CM

## 2019-01-28 MED ORDER — ROSUVASTATIN CALCIUM 5 MG PO TABS: 5.0000 mg | ORAL_TABLET | Freq: Every day | ORAL | 1 refills | Status: DC

## 2019-01-28 NOTE — Telephone Encounter (Signed)
Medication Refill - Medication: rosuvastatin (CRESTOR) 5 MG tablet Pill took her last pill today  Has the patient contacted their pharmacy? Yes.   (Agent: If no, request that the patient contact the pharmacy for the refill.) (Agent: If yes, when and what did the pharmacy advise?)  Pharmacy advised Pt that they have sent  Request with no response from office   Preferred Pharmacy (with phone number or street name):  [facility] [phone] - [place], [place] [address] [place] Phone:  [phone]  Fax:  [phone]       Agent: Please be advised that RX refills may take up to 3 business days. We ask that you follow-up with your pharmacy.

## 2019-02-05 ENCOUNTER — Telehealth: Payer: Self-pay

## 2019-02-05 NOTE — Telephone Encounter (Signed)
Pt request ORDER for COVID-19 TEST due to exposure 01/31/19 & pt states symptom of sore throat upon waking one morning this week. Please advise.

## 2019-02-06 DIAGNOSIS — Z20822 Contact with and (suspected) exposure to covid-19: Secondary | ICD-10-CM | POA: Diagnosis not present

## 2019-02-19 ENCOUNTER — Other Ambulatory Visit: Payer: Self-pay | Admitting: Internal Medicine

## 2019-02-19 DIAGNOSIS — I10 Essential (primary) hypertension: Secondary | ICD-10-CM

## 2019-02-27 DIAGNOSIS — K219 Gastro-esophageal reflux disease without esophagitis: Secondary | ICD-10-CM | POA: Diagnosis not present

## 2019-03-03 DIAGNOSIS — Z23 Encounter for immunization: Secondary | ICD-10-CM | POA: Diagnosis not present

## 2019-03-20 DIAGNOSIS — M25551 Pain in right hip: Secondary | ICD-10-CM | POA: Diagnosis not present

## 2019-03-20 DIAGNOSIS — M7631 Iliotibial band syndrome, right leg: Secondary | ICD-10-CM | POA: Diagnosis not present

## 2019-03-20 DIAGNOSIS — M47896 Other spondylosis, lumbar region: Secondary | ICD-10-CM | POA: Diagnosis not present

## 2019-03-20 DIAGNOSIS — M545 Low back pain: Secondary | ICD-10-CM | POA: Diagnosis not present

## 2019-03-31 DIAGNOSIS — Z23 Encounter for immunization: Secondary | ICD-10-CM | POA: Diagnosis not present

## 2019-04-01 DIAGNOSIS — M5416 Radiculopathy, lumbar region: Secondary | ICD-10-CM | POA: Diagnosis not present

## 2019-04-08 DIAGNOSIS — M5416 Radiculopathy, lumbar region: Secondary | ICD-10-CM | POA: Diagnosis not present

## 2019-04-14 DIAGNOSIS — M25551 Pain in right hip: Secondary | ICD-10-CM | POA: Diagnosis not present

## 2019-04-17 DIAGNOSIS — M545 Low back pain: Secondary | ICD-10-CM | POA: Diagnosis not present

## 2019-04-17 DIAGNOSIS — M419 Scoliosis, unspecified: Secondary | ICD-10-CM | POA: Diagnosis not present

## 2019-04-17 DIAGNOSIS — M5136 Other intervertebral disc degeneration, lumbar region: Secondary | ICD-10-CM | POA: Diagnosis not present

## 2019-04-17 DIAGNOSIS — M48062 Spinal stenosis, lumbar region with neurogenic claudication: Secondary | ICD-10-CM | POA: Diagnosis not present

## 2019-04-21 DIAGNOSIS — Z01419 Encounter for gynecological examination (general) (routine) without abnormal findings: Secondary | ICD-10-CM | POA: Diagnosis not present

## 2019-04-21 DIAGNOSIS — Z6828 Body mass index (BMI) 28.0-28.9, adult: Secondary | ICD-10-CM | POA: Diagnosis not present

## 2019-04-21 DIAGNOSIS — R309 Painful micturition, unspecified: Secondary | ICD-10-CM | POA: Diagnosis not present

## 2019-04-21 DIAGNOSIS — M810 Age-related osteoporosis without current pathological fracture: Secondary | ICD-10-CM | POA: Diagnosis not present

## 2019-04-21 DIAGNOSIS — B372 Candidiasis of skin and nail: Secondary | ICD-10-CM | POA: Diagnosis not present

## 2019-04-23 DIAGNOSIS — M5416 Radiculopathy, lumbar region: Secondary | ICD-10-CM | POA: Diagnosis not present

## 2019-04-25 ENCOUNTER — Encounter: Payer: Self-pay | Admitting: Internal Medicine

## 2019-04-25 DIAGNOSIS — M85852 Other specified disorders of bone density and structure, left thigh: Secondary | ICD-10-CM | POA: Diagnosis not present

## 2019-04-25 DIAGNOSIS — M810 Age-related osteoporosis without current pathological fracture: Secondary | ICD-10-CM | POA: Diagnosis not present

## 2019-04-25 LAB — HM MAMMOGRAPHY

## 2019-05-01 DIAGNOSIS — H43822 Vitreomacular adhesion, left eye: Secondary | ICD-10-CM | POA: Diagnosis not present

## 2019-05-01 DIAGNOSIS — H35362 Drusen (degenerative) of macula, left eye: Secondary | ICD-10-CM | POA: Diagnosis not present

## 2019-05-01 DIAGNOSIS — H3540 Unspecified peripheral retinal degeneration: Secondary | ICD-10-CM | POA: Diagnosis not present

## 2019-05-02 ENCOUNTER — Other Ambulatory Visit: Payer: Self-pay | Admitting: Internal Medicine

## 2019-05-02 ENCOUNTER — Encounter: Payer: Self-pay | Admitting: Internal Medicine

## 2019-05-02 DIAGNOSIS — I10 Essential (primary) hypertension: Secondary | ICD-10-CM

## 2019-07-03 ENCOUNTER — Other Ambulatory Visit: Payer: Self-pay | Admitting: Internal Medicine

## 2019-07-03 ENCOUNTER — Ambulatory Visit (INDEPENDENT_AMBULATORY_CARE_PROVIDER_SITE_OTHER): Payer: Medicare Other | Admitting: Internal Medicine

## 2019-07-03 ENCOUNTER — Other Ambulatory Visit: Payer: Self-pay

## 2019-07-03 ENCOUNTER — Encounter: Payer: Self-pay | Admitting: Internal Medicine

## 2019-07-03 VITALS — BP 170/94 | HR 68 | Temp 97.8°F | Ht 63.0 in | Wt 167.0 lb

## 2019-07-03 DIAGNOSIS — I10 Essential (primary) hypertension: Secondary | ICD-10-CM | POA: Diagnosis not present

## 2019-07-03 DIAGNOSIS — E785 Hyperlipidemia, unspecified: Secondary | ICD-10-CM

## 2019-07-03 LAB — CBC WITH DIFFERENTIAL/PLATELET
Basophils Absolute: 0.1 10*3/uL (ref 0.0–0.1)
Basophils Relative: 1 % (ref 0.0–3.0)
Eosinophils Absolute: 0.1 10*3/uL (ref 0.0–0.7)
Eosinophils Relative: 1.5 % (ref 0.0–5.0)
HCT: 41.7 % (ref 36.0–46.0)
Hemoglobin: 14.2 g/dL (ref 12.0–15.0)
Lymphocytes Relative: 19.6 % (ref 12.0–46.0)
Lymphs Abs: 1.2 10*3/uL (ref 0.7–4.0)
MCHC: 34.1 g/dL (ref 30.0–36.0)
MCV: 89.9 fl (ref 78.0–100.0)
Monocytes Absolute: 0.4 10*3/uL (ref 0.1–1.0)
Monocytes Relative: 6.9 % (ref 3.0–12.0)
Neutro Abs: 4.4 10*3/uL (ref 1.4–7.7)
Neutrophils Relative %: 71 % (ref 43.0–77.0)
Platelets: 172 10*3/uL (ref 150.0–400.0)
RBC: 4.64 Mil/uL (ref 3.87–5.11)
RDW: 13.3 % (ref 11.5–15.5)
WBC: 6.2 10*3/uL (ref 4.0–10.5)

## 2019-07-03 LAB — URINALYSIS, ROUTINE W REFLEX MICROSCOPIC
Bilirubin Urine: NEGATIVE
Ketones, ur: NEGATIVE
Leukocytes,Ua: NEGATIVE
Nitrite: NEGATIVE
Specific Gravity, Urine: 1.015 (ref 1.000–1.030)
Total Protein, Urine: NEGATIVE
Urine Glucose: NEGATIVE
Urobilinogen, UA: 0.2 (ref 0.0–1.0)
pH: 6 (ref 5.0–8.0)

## 2019-07-03 LAB — BASIC METABOLIC PANEL
BUN: 12 mg/dL (ref 6–23)
CO2: 28 mEq/L (ref 19–32)
Calcium: 9.4 mg/dL (ref 8.4–10.5)
Chloride: 103 mEq/L (ref 96–112)
Creatinine, Ser: 0.84 mg/dL (ref 0.40–1.20)
GFR: 66.38 mL/min (ref >60.00)
Glucose, Bld: 100 mg/dL — ABNORMAL HIGH (ref 70–99)
Potassium: 4 mEq/L (ref 3.5–5.1)
Sodium: 138 mEq/L (ref 135–145)

## 2019-07-03 LAB — TSH: TSH: 0.85 u[IU]/mL (ref 0.35–4.50)

## 2019-07-03 MED ORDER — INDAPAMIDE 1.25 MG PO TABS: 1.2500 mg | ORAL_TABLET | Freq: Every day | ORAL | 0 refills | Status: DC

## 2019-07-03 NOTE — Patient Instructions (Signed)

## 2019-07-03 NOTE — Progress Notes (Signed)
Subjective:  Patient ID: Vanessa Fuentes, female    DOB: 04-Jan-1946  Age: 74 y.o. MRN: [id number]  CC: Hypertension  This visit occurred during the SARS-CoV-2 public health emergency.  Safety protocols were in place, including screening questions prior to the visit, additional usage of staff PPE, and extensive cleaning of exam room while observing appropriate contact time as indicated for disinfecting solutions.    HPI Vanessa Fuentes presents for f/up - She complains of a 1 week history of blurred vision, headache, and burning sensation in her face.  She has also noticed that her blood pressure has been too high.  She tells me she is compliant with valsartan.  She tells me she is not taking anything like an NSAID or decongestant that would raise her blood pressure.  She denies nausea, vomiting, chest pain, shortness of breath, palpitations, edema, or fatigue.  Outpatient Medications Prior to Visit  Medication Sig Dispense Refill  . alendronate (FOSAMAX) 70 MG tablet alendronate 70 mg tablet  TK 1 T PO Q WK    . valsartan (DIOVAN) 160 MG tablet TAKE 1 TABLET(160 MG) BY MOUTH DAILY 90 tablet 0  . omeprazole (PRILOSEC) 40 MG capsule Take 40 mg by mouth in the morning and at bedtime.    . rosuvastatin (CRESTOR) 5 MG tablet Take 1 tablet (5 mg total) by mouth at bedtime. 90 tablet 1   No facility-administered medications prior to visit.    ROS Review of Systems  Constitutional: Negative.  Negative for appetite change, diaphoresis, fatigue and unexpected weight change.  Eyes: Positive for visual disturbance.  Respiratory: Negative for cough, chest tightness, shortness of breath and wheezing.   Cardiovascular: Negative for chest pain, palpitations and leg swelling.  Gastrointestinal: Negative for abdominal pain, constipation, diarrhea, nausea and vomiting.  Endocrine: Negative.   Genitourinary: Negative.  Negative for difficulty urinating, dysuria and hematuria.  Musculoskeletal:  Negative for arthralgias, back pain, myalgias and neck pain.  Skin: Negative.  Negative for color change and pallor.  Neurological: Positive for headaches. Negative for dizziness, seizures, speech difficulty, weakness and light-headedness.  Hematological: Negative for adenopathy. Does not bruise/bleed easily.  Psychiatric/Behavioral: Negative.     Objective:  BP (!) 170/94 (BP Location: Left Arm, Patient Position: Sitting, Cuff Size: Large)   Pulse 68   Temp 97.8 F (36.6 C) (Oral)   Ht 5\' 3"  (1.6 m)   Wt 167 lb (75.8 kg)   SpO2 97%   BMI 29.58 kg/m   BP Readings from Last 3 Encounters:  07/03/19 (!) 170/94  11/27/18 136/78  11/27/17 128/70    Wt Readings from Last 3 Encounters:  07/03/19 167 lb (75.8 kg)  11/27/18 165 lb 4 oz (75 kg)  11/27/17 170 lb (77.1 kg)    Physical Exam Vitals reviewed.  Constitutional:      General: She is not in acute distress.    Appearance: Normal appearance. She is not ill-appearing or diaphoretic.  HENT:     Nose: Nose normal.     Mouth/Throat:     Mouth: Mucous membranes are moist.  Eyes:     General: No scleral icterus.    Conjunctiva/sclera: Conjunctivae normal.  Cardiovascular:     Rate and Rhythm: Normal rate and regular rhythm.     Heart sounds: No murmur heard.      Comments: EKG - NSR, 68 bpm NS ST changes No LVH and no Q waves Pulmonary:     Effort: Pulmonary effort is normal.  Breath sounds: No stridor. No wheezing, rhonchi or rales.  Abdominal:     General: Abdomen is flat. Bowel sounds are normal. There is no distension.     Palpations: Abdomen is soft. There is no hepatomegaly, splenomegaly or mass.     Tenderness: There is no abdominal tenderness.  Musculoskeletal:        General: No swelling.     Cervical back: Neck supple.     Right lower leg: No edema.     Left lower leg: No edema.  Lymphadenopathy:     Cervical: No cervical adenopathy.  Skin:    General: Skin is warm and dry.     Findings: No lesion.   Neurological:     General: No focal deficit present.     Mental Status: She is alert and oriented to person, place, and time. Mental status is at baseline.  Psychiatric:        Mood and Affect: Mood normal.        Behavior: Behavior normal.     Lab Results  Component Value Date   WBC 6.2 07/03/2019   HGB 14.2 07/03/2019   HCT 41.7 07/03/2019   PLT 172.0 07/03/2019   GLUCOSE 100 (H) 07/03/2019   CHOL 156 11/27/2018   TRIG 103.0 11/27/2018   HDL 60.00 11/27/2018   LDLDIRECT 148.3 04/13/2011   LDLCALC 76 11/27/2018   ALT 12 11/27/2018   AST 14 11/27/2018   NA 138 07/03/2019   K 4.0 07/03/2019   CL 103 07/03/2019   CREATININE 0.84 07/03/2019   BUN 12 07/03/2019   CO2 28 07/03/2019   TSH 0.85 07/03/2019   HGBA1C 5.0 08/10/2015    DG Chest 2 View  Result Date: 10/30/2016 CLINICAL DATA:  Cough, chills, fatigue x 10 days, HTN, EXAM: CHEST  2 VIEW COMPARISON:  08/04/2009 FINDINGS: Midline trachea. Normal heart size. Tortuous thoracic aorta. No pleural effusion or pneumothorax. Clear lungs. IMPRESSION: No acute cardiopulmonary disease. Electronically Signed   By: Abigail Miyamoto M.D.   On: 10/30/2016 12:03    Assessment & Plan:   Sianne was seen today for hypertension.  Diagnoses and all orders for this visit:  Essential hypertension- Her blood pressure is not well controlled and she is symptomatic.  Her labs are negative for secondary causes or endorgan damage.  Her EKG is negative for acute changes.  I recommended that she add indapamide to the ARB. -     Basic metabolic panel; Future -     CBC with Differential/Platelet; Future -     TSH; Future -     Urinalysis, Routine w reflex microscopic; Future -     indapamide (LOZOL) 1.25 MG tablet; Take 1 tablet (1.25 mg total) by mouth daily. -     EKG 12-Lead -     Urinalysis, Routine w reflex microscopic -     TSH -     CBC with Differential/Platelet -     Basic metabolic panel  Hypercalcemia- Her calcium level is in the  high normal range.  Since she is starting a thiazide diuretic I will monitor her calcium level closely. -     Basic metabolic panel; Future -     Basic metabolic panel   I have discontinued Ziggy Reveles. Sun's omeprazole. I am also having her start on indapamide. Additionally, I am having her maintain her alendronate and valsartan.  Meds ordered this encounter  Medications  . indapamide (LOZOL) 1.25 MG tablet    Sig: Take 1  tablet (1.25 mg total) by mouth daily.    Dispense:  90 tablet    Refill:  0     Follow-up: Return in about 3 weeks (around 07/24/2019).  Scarlette Calico, MD

## 2019-07-12 ENCOUNTER — Other Ambulatory Visit: Payer: Self-pay | Admitting: Internal Medicine

## 2019-07-12 DIAGNOSIS — I10 Essential (primary) hypertension: Secondary | ICD-10-CM

## 2019-07-14 ENCOUNTER — Telehealth: Payer: Self-pay | Admitting: Internal Medicine

## 2019-07-14 NOTE — Telephone Encounter (Signed)
New message:   Pt states she feels as if her head is swimming sometimes but she is unsure as of to which medication is making her feel this way. She states her face is turning pink. She states she is currently out of town. Please advise.

## 2019-07-14 NOTE — Telephone Encounter (Signed)
LVM for pt - per pcp - okay to stop the indapamide. Monitor symptoms and let us know if there any worsening symptoms.

## 2019-07-14 NOTE — Telephone Encounter (Signed)
Patient contacted and stated that she has felt light headed with position changes since the start of the indapamide.  Pt has taken her medication this morning. 117/57 is the last blood pressure reading patient got.   Please advise patient (she is currently out of town).

## 2019-07-31 ENCOUNTER — Encounter: Payer: Self-pay | Admitting: Internal Medicine

## 2019-07-31 ENCOUNTER — Ambulatory Visit (INDEPENDENT_AMBULATORY_CARE_PROVIDER_SITE_OTHER): Payer: Medicare Other

## 2019-07-31 ENCOUNTER — Other Ambulatory Visit: Payer: Self-pay

## 2019-07-31 ENCOUNTER — Ambulatory Visit (INDEPENDENT_AMBULATORY_CARE_PROVIDER_SITE_OTHER): Payer: Medicare Other | Admitting: Internal Medicine

## 2019-07-31 VITALS — BP 118/70 | HR 77 | Temp 98.3°F | Resp 16 | Ht 63.0 in | Wt 163.0 lb

## 2019-07-31 VITALS — BP 128/80 | HR 68 | Temp 98.3°F | Ht 63.0 in | Wt 167.0 lb

## 2019-07-31 DIAGNOSIS — I10 Essential (primary) hypertension: Secondary | ICD-10-CM | POA: Diagnosis not present

## 2019-07-31 DIAGNOSIS — Z0000 Encounter for general adult medical examination without abnormal findings: Secondary | ICD-10-CM

## 2019-07-31 NOTE — Patient Instructions (Signed)

## 2019-07-31 NOTE — Progress Notes (Signed)
Subjective:   Vanessa Fuentes is a 74 y.o. female who presents for Medicare Annual (Subsequent) preventive examination.  Review of Systems    No ROS. Medicare Wellness Visit Cardiac Risk Factors include: advanced age (>16men, >80 women);dyslipidemia;family history of premature cardiovascular disease     Objective:    Today's Vitals   07/31/19 1114  BP: 118/70  Pulse: 77  Resp: 16  Temp: 98.3 F (36.8 C)  SpO2: 97%  Weight: 163 lb (73.9 kg)  Height: 5\' 3"  (1.6 m)  PainSc: 0-No pain   Body mass index is 28.87 kg/m.  Advanced Directives 07/31/2019 12/26/2016 11/24/2016 10/18/2016 08/11/2015 02/04/2015 01/21/2015  Does Patient Have a Medical Advance Directive? Yes Yes Yes Yes Yes Yes Yes  Type of Paramedic of [place];Living will [place];Living will - [place];Living will [place];Living will Advance instruction for mental health treatment;Living will;Healthcare Power of [place];Living will  Does patient want to make changes to medical advance directive? No - Patient declined - - - No - Patient declined - No - Patient declined  Copy of [place] in Chart? No - copy requested No - copy requested - No - copy requested Yes No - copy requested No - copy requested  Would patient like information on creating a medical advance directive? - No - Patient declined - - - - -    Current Medications (verified) Outpatient Encounter Medications as of 07/31/2019  Medication Sig  . alendronate (FOSAMAX) 70 MG tablet alendronate 70 mg tablet  TK 1 T PO Q WK  . indapamide (LOZOL) 1.25 MG tablet Take 1 tablet (1.25 mg total) by mouth daily. (Patient not taking: Reported on 07/31/2019)  . rosuvastatin (CRESTOR) 5 MG tablet TAKE 1 TABLET(5 MG) BY MOUTH AT BEDTIME  . valsartan (DIOVAN) 160 MG tablet TAKE 1 TABLET(160 MG) BY MOUTH DAILY  . [DISCONTINUED] valsartan (DIOVAN)  160 MG tablet TAKE 1 TABLET(160 MG) BY MOUTH DAILY  . [DISCONTINUED] valsartan (DIOVAN) 160 MG tablet TAKE 1 TABLET(160 MG) BY MOUTH DAILY   No facility-administered encounter medications on file as of 07/31/2019.    Allergies (verified) Sulfonamide derivatives   History: Past Medical History:  Diagnosis Date  . Brachial neuritis or radiculitis NOS   . Cervicalgia   . Degeneration of cervical intervertebral disc   . Degeneration of lumbar or lumbosacral intervertebral disc   . Dysfunction of eustachian tube   . Osteoporosis 2017  . Spinal stenosis in cervical region   . Unspecified essential hypertension    Past Surgical History:  Procedure Laterality Date  . CERVICAL DISCECTOMY  07/2009   C3-4 with fusion- Dr Janine Ores  . COLONOSCOPY     polypectomy  . NASAL SEPTUM SURGERY  01/2009   [facility]  . SPINE SURGERY    . TUBAL LIGATION     Family History  Problem Relation Age of Onset  . Hypertension Mother   . Heart disease Mother        CAD MI CABG  . Cancer Mother   . Heart disease Father        CAD MI  . Hypertension Father   . Heart disease Brother        CAD  . Esophageal cancer Brother   . Cancer Maternal Aunt        Breast  . Diabetes Neg Hx   . Colon cancer Neg Hx   . Stomach  cancer Neg Hx   . COPD Neg Hx    Social History   Socioeconomic History  . Marital status: Married    Spouse name: Not on file  . Number of children: 1  . Years of education: Not on file  . Highest education level: Not on file  Occupational History  . Occupation: retired  Tobacco Use  . Smoking status: Former Smoker    Quit date: 02/16/1981    Years since quitting: 38.4  . Smokeless tobacco: Never Used  . Tobacco comment: During Adolescence  Substance and Sexual Activity  . Alcohol use: Yes    Alcohol/week: 2.0 standard drinks    Types: 2 Standard drinks or equivalent per week  . Drug use: No  . Sexual activity: Yes    Partners: Male  Other Topics Concern  . Not on file    Social History Narrative   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 Married in 1969, one daughter-'73 and 2 grandchildren.  Work- retired from  [employer] October '12 after 43 years. Life is good.    Social Determinants of Health   Financial Resource Strain: Low Risk   . Difficulty of Paying Living Expenses: Not hard at all  Food Insecurity: No Food Insecurity  . Worried About Charity fundraiser in the Last Year: Never true  . Ran Out of Food in the Last Year: Never true  Transportation Needs: No Transportation Needs  . Lack of Transportation (Medical): No  . Lack of Transportation (Non-Medical): No  Physical Activity:   . Days of Exercise per Week:   . Minutes of Exercise per Session:   Stress: No Stress Concern Present  . Feeling of Stress : Not at all  Social Connections: Socially Integrated  . Frequency of Communication with Friends and Family: More than three times a week  . Frequency of Social Gatherings with Friends and Family: More than three times a week  . Attends Religious Services: More than 4 times per year  . Active Member of Clubs or Organizations: Yes  . Attends Archivist Meetings: More than 4 times per year  . Marital Status: Married    Tobacco Counseling Counseling given: No Comment: During Adolescence   Clinical Intake:  Pre-visit preparation completed: Yes  Pain : No/denies pain Pain Score: 0-No pain     BMI - recorded: 28.87 Nutritional Status: BMI 25 -29 Overweight Nutritional Risks: None Diabetes: No  How often do you need to have someone help you when you read instructions, pamphlets, or other written materials from your doctor or pharmacy?: 1 - Never What is the last grade level you completed in school?: Associate's Degree  Diabetic? no  Interpreter Needed?: No  Information entered by :: Shala Baumbach N. Myanna Ziesmer, LPN   Activities of Daily Living In your present state of health, do you have any difficulty performing the following activities:  07/31/2019  Hearing? N  Vision? N  Difficulty concentrating or making decisions? N  Walking or climbing stairs? N  Dressing or bathing? N  Doing errands, shopping? N  Preparing Food and eating ? N  Using the Toilet? N  In the past six months, have you accidently leaked urine? N  Do you have problems with loss of bowel control? N  Managing your Medications? N  Managing your Finances? N  Housekeeping or managing your Housekeeping? N  Some recent data might be hidden    Patient Care Team: Janith Lima, MD as PCP - General (Internal Medicine)  Indicate  any recent Medical Services you may have received from other than [facility] providers in the past year (date may be approximate).     Assessment:   This is a routine wellness examination for Kerline.  Hearing/Vision screen No exam data present  Dietary issues and exercise activities discussed: Current Exercise Habits: Home exercise routine (yard work; walking in the neighborhood), Type of exercise: walking, Time (Minutes): 30, Frequency (Times/Week): 5, Weekly Exercise (Minutes/Week): 150, Intensity: Moderate, Exercise limited by: None identified  Goals    .  Patient Stated (pt-stated)      To stay healthy and to lose weight.      Depression Screen PHQ 2/9 Scores 07/31/2019 11/27/2018 11/28/2017 11/27/2017 11/24/2016 11/24/2016 10/18/2016  PHQ - 2 Score 0 0 0 0 0 0 0  PHQ- 9 Score - - - - 0 - -    Fall Risk Fall Risk  07/31/2019 12/18/2018 11/27/2018 11/28/2017 11/27/2017  Falls in the past year? 0 0 0 0 0  Comment - Emmi Telephone Survey: data to providers prior to load - - -  Number falls in past yr: 0 - 0 - 0  Injury with Fall? 0 - 0 - 0  Risk for fall due to : No Fall Risks - - - -  Follow up Falls evaluation completed - Falls evaluation completed - Falls evaluation completed    Any stairs in or around the home? Yes  If so, are there any without handrails? No  Home free of loose throw rugs in walkways, pet beds, electrical cords,  etc? Yes  Adequate lighting in your home to reduce risk of falls? Yes   ASSISTIVE DEVICES UTILIZED TO PREVENT FALLS:  Life alert? No  Use of a cane, walker or w/c? No  Grab bars in the bathroom? Yes  Shower chair or bench in shower? Yes  Elevated toilet seat or a handicapped toilet? No   TIMED UP AND GO:  Was the test performed? No .  Length of time to ambulate 10 feet: 0 sec.   Gait steady and fast without use of assistive device  Cognitive Function: Not indicated; Patient is cogitatively intact.        Immunizations Immunization History  Administered Date(s) Administered  . Fluad Quad(high Dose 65+) 11/12/2018  . Influenza Split 11/13/2013  . Influenza Whole 12/22/2009, 10/17/2011  . Influenza, High Dose Seasonal PF 09/06/2012, 09/13/2014, 10/17/2016, 11/27/2017, 10/24/2018  . Influenza-Unspecified 09/13/2014  . Moderna SARS-COVID-2 Vaccination 03/03/2019, 03/31/2019  . Pneumococcal Conjugate-13 11/13/2013  . Pneumococcal Polysaccharide-23 09/06/2012  . Td 12/23/1996  . Tdap 04/23/2013  . Zoster 12/22/2009    TDAP status: Up to date Flu Vaccine status: Up to date Pneumococcal vaccine status: Up to date Covid-19 vaccine status: Completed vaccines  Qualifies for Shingles Vaccine? Yes   Zostavax completed Yes   Shingrix Completed?: No.    Education has been provided regarding the importance of this vaccine. Patient has been advised to call insurance company to determine out of pocket expense if they have not yet received this vaccine. Advised may also receive vaccine at local pharmacy or Health Dept. Verbalized acceptance and understanding.  Screening Tests Health Maintenance  Topic Date Due  . INFLUENZA VACCINE  08/24/2019  . COLONOSCOPY  02/04/2020  . MAMMOGRAM  04/24/2021  . TETANUS/TDAP  04/24/2023  . DEXA SCAN  Completed  . COVID-19 Vaccine  Completed  . Hepatitis C Screening  Completed  . PNA vac Low Risk Adult  Completed    Health  Maintenance  There are no preventive care reminders to display for this patient.  Colorectal cancer screening: Completed 02/04/2015. Repeat every 5 years Mammogram status: Completed 04/25/2019. Repeat every year Bone Density status: Completed 04/25/2019. Results reflect: Bone density results: NORMAL. Repeat every 2-4 years.  Lung Cancer Screening: (Low Dose CT Chest recommended if Age 70-80 years, 30 pack-year currently smoking OR have quit w/in 15years.) does not qualify.   Lung Cancer Screening Referral: no  Additional Screening:  Hepatitis C Screening: does qualify; Completed yes  Vision Screening: Recommended annual ophthalmology exams for early detection of glaucoma and other disorders of the eye. Is the patient up to date with their annual eye exam?  Yes  Who is the provider or what is the name of the office in which the patient attends annual eye exams? [facility] [place]  If pt is not established with a provider, would they like to be referred to a provider to establish care? No .   Dental Screening: Recommended annual dental exams for proper oral hygiene  Community Resource Referral / Chronic Care Management: CRR required this visit?  No   CCM required this visit?  No      Plan:     I have personally reviewed and noted the following in the patient's chart:   . Medical and social history . Use of alcohol, tobacco or illicit drugs  . Current medications and supplements . Functional ability and status . Nutritional status . Physical activity . Advanced directives . List of other physicians . Hospitalizations, surgeries, and ER visits in previous 12 months . Vitals . Screenings to include cognitive, depression, and falls . Referrals and appointments  In addition, I have reviewed and discussed with patient certain preventive protocols, quality metrics, and best practice recommendations. A written personalized care plan for preventive services as well as general  preventive health recommendations were provided to patient.     Sheral Flow, LPN   01/28/1094   Nurse Notes: n/a

## 2019-07-31 NOTE — Progress Notes (Signed)
Subjective:  Patient ID: Vanessa Fuentes, female    DOB: [birth year]  Age: 74 y.o. MRN: [id number]  CC: Hypertension  This visit occurred during the SARS-CoV-2 public health emergency.  Safety protocols were in place, including screening questions prior to the visit, additional usage of staff PPE, and extensive cleaning of exam room while observing appropriate contact time as indicated for disinfecting solutions.    HPI Vanessa Fuentes presents for f/up - When I saw her a few weeks ago her blood pressure was elevated at 170/94.  Evaluation work-up for secondary causes was unremarkable.  She still does not would know why her blood pressure was so high that day.  She says I made a comment about her alcohol use so she has since stopped drinking.  I added indapamide but she felt like it drove her blood pressure too low so she stopped taking it.  She is compliant with the ARB.  She tells me her blood pressure has been well controlled.  She offers no complaints today.  Outpatient Medications Prior to Visit  Medication Sig Dispense Refill  . alendronate (FOSAMAX) 70 MG tablet alendronate 70 mg tablet  TK 1 T PO Q WK    . rosuvastatin (CRESTOR) 5 MG tablet TAKE 1 TABLET(5 MG) BY MOUTH AT BEDTIME 90 tablet 1  . valsartan (DIOVAN) 160 MG tablet TAKE 1 TABLET(160 MG) BY MOUTH DAILY 90 tablet 0  . indapamide (LOZOL) 1.25 MG tablet Take 1 tablet (1.25 mg total) by mouth daily. (Patient not taking: Reported on 07/31/2019) 90 tablet 0   No facility-administered medications prior to visit.    ROS Review of Systems  Constitutional: Negative for diaphoresis, fatigue and unexpected weight change.  HENT: Negative.   Eyes: Negative for visual disturbance.  Respiratory: Negative for cough, chest tightness, shortness of breath and wheezing.   Cardiovascular: Negative for chest pain, palpitations and leg swelling.  Gastrointestinal: Negative for abdominal pain, constipation, diarrhea, nausea and vomiting.    Endocrine: Negative.   Genitourinary: Negative.  Negative for difficulty urinating.  Musculoskeletal: Negative for back pain and myalgias.  Skin: Negative.  Negative for color change.  Neurological: Negative.  Negative for dizziness, weakness, light-headedness and headaches.  Hematological: Negative for adenopathy. Does not bruise/bleed easily.  Psychiatric/Behavioral: Negative.     Objective:  BP 128/80 (BP Location: Left Arm, Patient Position: Sitting, Cuff Size: Normal)   Pulse 68   Temp 98.3 F (36.8 C) (Oral)   Ht 5\' 3"  (1.6 m)   Wt 167 lb (75.8 kg)   SpO2 97%   BMI 29.58 kg/m   BP Readings from Last 3 Encounters:  07/31/19 118/70  07/31/19 128/80  07/03/19 (!) 170/94    Wt Readings from Last 3 Encounters:  07/31/19 163 lb (73.9 kg)  07/31/19 167 lb (75.8 kg)  07/03/19 167 lb (75.8 kg)    Physical Exam Vitals reviewed.  Constitutional:      Appearance: Normal appearance.  HENT:     Nose: Nose normal.     Mouth/Throat:     Mouth: Mucous membranes are moist.  Eyes:     General: No scleral icterus.    Conjunctiva/sclera: Conjunctivae normal.  Cardiovascular:     Rate and Rhythm: Normal rate and regular rhythm.     Heart sounds: No murmur heard.   Pulmonary:     Effort: Pulmonary effort is normal.     Breath sounds: No stridor. No wheezing, rhonchi or rales.  Abdominal:     General:  Abdomen is flat. Bowel sounds are normal. There is no distension.     Palpations: Abdomen is soft. There is no hepatomegaly or splenomegaly.     Tenderness: There is no abdominal tenderness.  Musculoskeletal:        General: Normal range of motion.     Cervical back: Neck supple.     Right lower leg: No edema.     Left lower leg: No edema.  Lymphadenopathy:     Cervical: No cervical adenopathy.  Skin:    General: Skin is warm and dry.     Coloration: Skin is not pale.  Neurological:     General: No focal deficit present.     Mental Status: She is alert.     Lab  Results  Component Value Date   WBC 6.2 07/03/2019   HGB 14.2 07/03/2019   HCT 41.7 07/03/2019   PLT 172.0 07/03/2019   GLUCOSE 100 (H) 07/03/2019   CHOL 156 11/27/2018   TRIG 103.0 11/27/2018   HDL 60.00 11/27/2018   LDLDIRECT 148.3 04/13/2011   LDLCALC 76 11/27/2018   ALT 12 11/27/2018   AST 14 11/27/2018   NA 138 07/03/2019   K 4.0 07/03/2019   CL 103 07/03/2019   CREATININE 0.84 07/03/2019   BUN 12 07/03/2019   CO2 28 07/03/2019   TSH 0.85 07/03/2019   HGBA1C 5.0 08/10/2015    DG Chest 2 View  Result Date: 10/30/2016 CLINICAL DATA:  Cough, chills, fatigue x 10 days, HTN, EXAM: CHEST  2 VIEW COMPARISON:  08/04/2009 FINDINGS: Midline trachea. Normal heart size. Tortuous thoracic aorta. No pleural effusion or pneumothorax. Clear lungs. IMPRESSION: No acute cardiopulmonary disease. Electronically Signed   By: Abigail Miyamoto M.D.   On: 10/30/2016 12:03    Assessment & Plan:   Vanessa Fuentes was seen today for hypertension.  Diagnoses and all orders for this visit:  Essential hypertension- Her blood pressure is well controlled with ARB monotherapy.  Will discontinue the thiazide.  She agrees to continue working on her lifestyle modifications.   I have discontinued [place] indapamide. I am also having her maintain her alendronate, rosuvastatin, and valsartan.  No orders of the defined types were placed in this encounter.    Follow-up: Return in about 6 months (around 01/31/2020).  Vanessa Calico, MD

## 2019-07-31 NOTE — Patient Instructions (Addendum)
Vanessa Fuentes , Thank you for taking time to come for your Medicare Wellness Visit. I appreciate your ongoing commitment to your health goals. Please review the following plan we discussed and let me know if I can assist you in the future.   Screening recommendations/referrals: Colonoscopy: 02/04/2015; due every 5 years Mammogram: 04/25/2019 Bone Density: 04/25/2019 Recommended yearly ophthalmology/optometry visit for glaucoma screening and checkup Recommended yearly dental visit for hygiene and checkup  Vaccinations: Influenza vaccine: 11/12/2018 Pneumococcal vaccine: completed Tdap vaccine: 04/23/2013; due every 10 years Shingles vaccine: never done   Covid-19: completed  Advanced directives: Please bring a copy of your health care power of attorney and living will to the office at your convenience.  Conditions/risks identified: Please continue to do your personal lifestyle choices by: daily care of teeth and gums, regular physical activity (goal should be 5 days a week for 30 minutes), eat a healthy diet, avoid tobacco and drug use, limiting any alcohol intake, taking a low-dose aspirin (if not allergic or have been advised by your provider otherwise) and taking vitamins and minerals as recommended by your provider. Continue doing brain stimulating activities (puzzles, reading, adult coloring books, staying active) to keep memory sharp. Continue to eat heart healthy diet (full of fruits, vegetables, whole grains, lean protein, water--limit salt, fat, and sugar intake) and increase physical activity as tolerated.  Next appointment: Please schedule your next Medicare Wellness Visit with your Nurse Health Advisor in 1 year.  Preventive Care 39 Years and Older, Female Preventive care refers to lifestyle choices and visits with your health care provider that can promote health and wellness. What does preventive care include?  A yearly physical exam. This is also called an annual well check.  Dental  exams once or twice a year.  Routine eye exams. Ask your health care provider how often you should have your eyes checked.  Personal lifestyle choices, including:  Daily care of your teeth and gums.  Regular physical activity.  Eating a healthy diet.  Avoiding tobacco and drug use.  Limiting alcohol use.  Practicing safe sex.  Taking low-dose aspirin every day.  Taking vitamin and mineral supplements as recommended by your health care provider. What happens during an annual well check? The services and screenings done by your health care provider during your annual well check will depend on your age, overall health, lifestyle risk factors, and family history of disease. Counseling  Your health care provider may ask you questions about your:  Alcohol use.  Tobacco use.  Drug use.  Emotional well-being.  Home and relationship well-being.  Sexual activity.  Eating habits.  History of falls.  Memory and ability to understand (cognition).  Work and work Statistician.  Reproductive health. Screening  You may have the following tests or measurements:  Height, weight, and BMI.  Blood pressure.  Lipid and cholesterol levels. These may be checked every 5 years, or more frequently if you are over 22 years old.  Skin check.  Lung cancer screening. You may have this screening every year starting at age 83 if you have a 30-pack-year history of smoking and currently smoke or have quit within the past 15 years.  Fecal occult blood test (FOBT) of the stool. You may have this test every year starting at age 81.  Flexible sigmoidoscopy or colonoscopy. You may have a sigmoidoscopy every 5 years or a colonoscopy every 10 years starting at age 85.  Hepatitis C blood test.  Hepatitis B blood test.  Sexually transmitted  disease (STD) testing.  Diabetes screening. This is done by checking your blood sugar (glucose) after you have not eaten for a while (fasting). You may  have this done every 1-3 years.  Bone density scan. This is done to screen for osteoporosis. You may have this done starting at age 61.  Mammogram. This may be done every 1-2 years. Talk to your health care provider about how often you should have regular mammograms. Talk with your health care provider about your test results, treatment options, and if necessary, the need for more tests. Vaccines  Your health care provider may recommend certain vaccines, such as:  Influenza vaccine. This is recommended every year.  Tetanus, diphtheria, and acellular pertussis (Tdap, Td) vaccine. You may need a Td booster every 10 years.  Zoster vaccine. You may need this after age 52.  Pneumococcal 13-valent conjugate (PCV13) vaccine. One dose is recommended after age 38.  Pneumococcal polysaccharide (PPSV23) vaccine. One dose is recommended after age 33. Talk to your health care provider about which screenings and vaccines you need and how often you need them. This information is not intended to replace advice given to you by your health care provider. Make sure you discuss any questions you have with your health care provider. Document Released: 02/05/2015 Document Revised: 09/29/2015 Document Reviewed: 11/10/2014 Elsevier Interactive Patient Education  2017 [employer] Prevention in the Home Falls can cause injuries. They can happen to people of all ages. There are many things you can do to make your home safe and to help prevent falls. What can I do on the outside of my home?  Regularly fix the edges of walkways and driveways and fix any cracks.  Remove anything that might make you trip as you walk through a door, such as a raised step or threshold.  Trim any bushes or trees on the path to your home.  Use bright outdoor lighting.  Clear any walking paths of anything that might make someone trip, such as rocks or tools.  Regularly check to see if handrails are loose or broken. Make  sure that both sides of any steps have handrails.  Any raised decks and porches should have guardrails on the edges.  Have any leaves, snow, or ice cleared regularly.  Use sand or salt on walking paths during winter.  Clean up any spills in your garage right away. This includes oil or grease spills. What can I do in the bathroom?  Use night lights.  Install grab bars by the toilet and in the tub and shower. Do not use towel bars as grab bars.  Use non-skid mats or decals in the tub or shower.  If you need to sit down in the shower, use a plastic, non-slip stool.  Keep the floor dry. Clean up any water that spills on the floor as soon as it happens.  Remove soap buildup in the tub or shower regularly.  Attach bath mats securely with double-sided non-slip rug tape.  Do not have throw rugs and other things on the floor that can make you trip. What can I do in the bedroom?  Use night lights.  Make sure that you have a light by your bed that is easy to reach.  Do not use any sheets or blankets that are too big for your bed. They should not hang down onto the floor.  Have a firm chair that has side arms. You can use this for support while you get dressed.  Do not have throw rugs and other things on the floor that can make you trip. What can I do in the kitchen?  Clean up any spills right away.  Avoid walking on wet floors.  Keep items that you use a lot in easy-to-reach places.  If you need to reach something above you, use a strong step stool that has a grab bar.  Keep electrical cords out of the way.  Do not use floor polish or wax that makes floors slippery. If you must use wax, use non-skid floor wax.  Do not have throw rugs and other things on the floor that can make you trip. What can I do with my stairs?  Do not leave any items on the stairs.  Make sure that there are handrails on both sides of the stairs and use them. Fix handrails that are broken or loose.  Make sure that handrails are as long as the stairways.  Check any carpeting to make sure that it is firmly attached to the stairs. Fix any carpet that is loose or worn.  Avoid having throw rugs at the top or bottom of the stairs. If you do have throw rugs, attach them to the floor with carpet tape.  Make sure that you have a light switch at the top of the stairs and the bottom of the stairs. If you do not have them, ask someone to add them for you. What else can I do to help prevent falls?  Wear shoes that:  Do not have high heels.  Have rubber bottoms.  Are comfortable and fit you well.  Are closed at the toe. Do not wear sandals.  If you use a stepladder:  Make sure that it is fully opened. Do not climb a closed stepladder.  Make sure that both sides of the stepladder are locked into place.  Ask someone to hold it for you, if possible.  Clearly mark and make sure that you can see:  Any grab bars or handrails.  First and last steps.  Where the edge of each step is.  Use tools that help you move around (mobility aids) if they are needed. These include:  Canes.  Walkers.  Scooters.  Crutches.  Turn on the lights when you go into a dark area. Replace any light bulbs as soon as they burn out.  Set up your furniture so you have a clear path. Avoid moving your furniture around.  If any of your floors are uneven, fix them.  If there are any pets around you, be aware of where they are.  Review your medicines with your doctor. Some medicines can make you feel dizzy. This can increase your chance of falling. Ask your doctor what other things that you can do to help prevent falls. This information is not intended to replace advice given to you by your health care provider. Make sure you discuss any questions you have with your health care provider. Document Released: 11/05/2008 Document Revised: 06/17/2015 Document Reviewed: 02/13/2014 Elsevier Interactive Patient  Education  2017 [employer].

## 2019-08-20 DIAGNOSIS — Z1231 Encounter for screening mammogram for malignant neoplasm of breast: Secondary | ICD-10-CM | POA: Diagnosis not present

## 2019-08-20 LAB — HM MAMMOGRAPHY

## 2019-08-28 ENCOUNTER — Encounter: Payer: Self-pay | Admitting: Internal Medicine

## 2019-09-27 ENCOUNTER — Other Ambulatory Visit: Payer: Self-pay | Admitting: Internal Medicine

## 2019-09-27 DIAGNOSIS — E785 Hyperlipidemia, unspecified: Secondary | ICD-10-CM

## 2019-09-30 DIAGNOSIS — L720 Epidermal cyst: Secondary | ICD-10-CM | POA: Diagnosis not present

## 2019-09-30 DIAGNOSIS — D1801 Hemangioma of skin and subcutaneous tissue: Secondary | ICD-10-CM | POA: Diagnosis not present

## 2019-09-30 DIAGNOSIS — L304 Erythema intertrigo: Secondary | ICD-10-CM | POA: Diagnosis not present

## 2019-09-30 DIAGNOSIS — L821 Other seborrheic keratosis: Secondary | ICD-10-CM | POA: Diagnosis not present

## 2019-10-03 ENCOUNTER — Other Ambulatory Visit: Payer: Self-pay | Admitting: Internal Medicine

## 2019-10-03 DIAGNOSIS — I10 Essential (primary) hypertension: Secondary | ICD-10-CM

## 2019-10-07 ENCOUNTER — Other Ambulatory Visit: Payer: Self-pay | Admitting: Internal Medicine

## 2019-10-07 DIAGNOSIS — I10 Essential (primary) hypertension: Secondary | ICD-10-CM

## 2019-10-22 DIAGNOSIS — Z23 Encounter for immunization: Secondary | ICD-10-CM | POA: Diagnosis not present

## 2019-10-30 DIAGNOSIS — H3540 Unspecified peripheral retinal degeneration: Secondary | ICD-10-CM | POA: Diagnosis not present

## 2019-10-30 DIAGNOSIS — H35363 Drusen (degenerative) of macula, bilateral: Secondary | ICD-10-CM | POA: Diagnosis not present

## 2019-10-30 DIAGNOSIS — H43822 Vitreomacular adhesion, left eye: Secondary | ICD-10-CM | POA: Diagnosis not present

## 2020-01-01 ENCOUNTER — Other Ambulatory Visit: Payer: Self-pay | Admitting: Internal Medicine

## 2020-01-01 DIAGNOSIS — I10 Essential (primary) hypertension: Secondary | ICD-10-CM

## 2020-02-16 ENCOUNTER — Other Ambulatory Visit: Payer: Self-pay

## 2020-02-17 ENCOUNTER — Other Ambulatory Visit: Payer: Self-pay | Admitting: Internal Medicine

## 2020-02-17 DIAGNOSIS — I10 Essential (primary) hypertension: Secondary | ICD-10-CM

## 2020-02-17 DIAGNOSIS — K2100 Gastro-esophageal reflux disease with esophagitis, without bleeding: Secondary | ICD-10-CM

## 2020-02-17 MED ORDER — OMEPRAZOLE 40 MG PO CPDR: 40.0000 mg | DELAYED_RELEASE_CAPSULE | Freq: Every day | ORAL | 1 refills | Status: DC

## 2020-02-17 MED ORDER — VALSARTAN 160 MG PO TABS
160.0000 mg | ORAL_TABLET | Freq: Every day | ORAL | 1 refills | Status: DC
Start: 2020-02-17 — End: 2020-08-21

## 2020-04-26 DIAGNOSIS — Z23 Encounter for immunization: Secondary | ICD-10-CM | POA: Diagnosis not present

## 2020-05-06 DIAGNOSIS — H35362 Drusen (degenerative) of macula, left eye: Secondary | ICD-10-CM | POA: Diagnosis not present

## 2020-05-06 DIAGNOSIS — H43822 Vitreomacular adhesion, left eye: Secondary | ICD-10-CM | POA: Diagnosis not present

## 2020-05-24 ENCOUNTER — Telehealth: Payer: Self-pay | Admitting: Internal Medicine

## 2020-05-24 NOTE — Telephone Encounter (Signed)
See if neurology will see her

## 2020-05-24 NOTE — Telephone Encounter (Signed)
Patients husband called and said that the patient has been having some memory issues for about a month. He was wondering what direction that he needed to take. The patient has an appt with PCP on 06-16-20. He can be reached at  [phone]. Please advise

## 2020-05-25 ENCOUNTER — Other Ambulatory Visit: Payer: Self-pay | Admitting: Internal Medicine

## 2020-05-25 ENCOUNTER — Telehealth: Payer: Self-pay | Admitting: Internal Medicine

## 2020-05-25 DIAGNOSIS — R413 Other amnesia: Secondary | ICD-10-CM

## 2020-05-25 NOTE — Telephone Encounter (Signed)
[facility] calling:  Recent referral sent to [facility]. Patient has not been seen within 6 months Current office notes will be needed

## 2020-05-25 NOTE — Telephone Encounter (Signed)
Pts last OV was 07/31/19. Are those ok to fax?

## 2020-05-26 NOTE — Telephone Encounter (Signed)
Follow up message  Spouse requesting a return call

## 2020-05-26 NOTE — Telephone Encounter (Signed)
Pts husband had been informed that a referral was placed. He would like to discuss the memory issues during her OV later this month prior to seeing neurology. He stated that he is trying to ease his way into this conversation with her and it not be abrupt if he states that he is taking her to neuro prior to her PCP OV.

## 2020-06-16 ENCOUNTER — Other Ambulatory Visit: Payer: Self-pay

## 2020-06-16 ENCOUNTER — Encounter: Payer: Self-pay | Admitting: Internal Medicine

## 2020-06-16 ENCOUNTER — Ambulatory Visit (INDEPENDENT_AMBULATORY_CARE_PROVIDER_SITE_OTHER): Payer: Medicare Other | Admitting: Internal Medicine

## 2020-06-16 VITALS — BP 130/82 | HR 71 | Temp 98.5°F | Wt 163.2 lb

## 2020-06-16 DIAGNOSIS — I10 Essential (primary) hypertension: Secondary | ICD-10-CM

## 2020-06-16 DIAGNOSIS — E785 Hyperlipidemia, unspecified: Secondary | ICD-10-CM | POA: Diagnosis not present

## 2020-06-16 DIAGNOSIS — R413 Other amnesia: Secondary | ICD-10-CM | POA: Diagnosis not present

## 2020-06-16 LAB — CBC WITH DIFFERENTIAL/PLATELET
Basophils Absolute: 0.1 10*3/uL (ref 0.0–0.1)
Basophils Relative: 1 % (ref 0.0–3.0)
Eosinophils Absolute: 0.1 10*3/uL (ref 0.0–0.7)
Eosinophils Relative: 1 % (ref 0.0–5.0)
HCT: 41.8 % (ref 36.0–46.0)
Hemoglobin: 14.2 g/dL (ref 12.0–15.0)
Lymphocytes Relative: 22.8 % (ref 12.0–46.0)
Lymphs Abs: 1.6 10*3/uL (ref 0.7–4.0)
MCHC: 34.1 g/dL (ref 30.0–36.0)
MCV: 89.4 fl (ref 78.0–100.0)
Monocytes Absolute: 0.4 10*3/uL (ref 0.1–1.0)
Monocytes Relative: 5.4 % (ref 3.0–12.0)
Neutro Abs: 5 10*3/uL (ref 1.4–7.7)
Neutrophils Relative %: 69.8 % (ref 43.0–77.0)
Platelets: 186 10*3/uL (ref 150.0–400.0)
RBC: 4.67 Mil/uL (ref 3.87–5.11)
RDW: 13.4 % (ref 11.5–15.5)
WBC: 7.2 10*3/uL (ref 4.0–10.5)

## 2020-06-16 LAB — LIPID PANEL
Cholesterol: 183 mg/dL (ref 0–200)
HDL: 66.1 mg/dL (ref >39.00)
LDL Cholesterol: 99 mg/dL (ref 0–99)
NonHDL: 116.52
Total CHOL/HDL Ratio: 3
Triglycerides: 89 mg/dL (ref 0.0–149.0)
VLDL: 17.8 mg/dL (ref 0.0–40.0)

## 2020-06-16 LAB — HEPATIC FUNCTION PANEL
ALT: 14 U/L (ref 0–35)
AST: 19 U/L (ref 0–37)
Albumin: 4.5 g/dL (ref 3.5–5.2)
Alkaline Phosphatase: 57 U/L (ref 39–117)
Bilirubin, Direct: 0.1 mg/dL (ref 0.0–0.3)
Total Bilirubin: 0.7 mg/dL (ref 0.2–1.2)
Total Protein: 7.2 g/dL (ref 6.0–8.3)

## 2020-06-16 LAB — FOLATE: Folate: >24.4 ng/mL (ref >5.9)

## 2020-06-16 LAB — VITAMIN B12: Vitamin B-12: 303 pg/mL (ref 211–911)

## 2020-06-16 NOTE — Patient Instructions (Signed)
Dementia Dementia is a condition that affects the way the brain functions. It often affects memory and thinking. Usually, dementia gets worse with time and cannot be reversed (progressive dementia). There are many types of dementia, including:  Alzheimer's disease. This type is the most common.  Vascular dementia. This type may happen as the result of a stroke.  Lewy body dementia. This type may happen to people who have Parkinson's disease.  Frontotemporal dementia. This type is caused by damage to nerve cells (neurons) in certain parts of the brain. Some people may be affected by more than one type of dementia. This is called mixed dementia. What are the causes? Dementia is caused by damage to cells in the brain. The area of the brain and the types of cells damaged determine the type of dementia. Usually, this damage is irreversible or cannot be undone. Some examples of irreversible causes include:  Conditions that affect the blood vessels of the brain, such as diabetes, heart disease, or blood vessel disease.  Genetic mutations. In some cases, changes in the brain may be caused by another condition and can be reversed or slowed. Some examples of reversible causes include:  Injury to the brain.  Certain medicines.  Infection, such as meningitis.  Metabolic problems, such as vitamin B12 deficiency or thyroid disease.  Pressure on the brain, such as from a tumor, blood clot, or too much fluid in the brain (hydrocephalus).  Autoimmune diseases that affect the brain or arteries, such as limbic encephalitis or vasculitis. What are the signs or symptoms? Symptoms of dementia depend on the type of dementia. Common signs of dementia include problems with remembering, thinking, problem solving, decision making, and communicating. These signs develop slowly or get worse with time. This may include:  Problems remembering events or people.  Having trouble taking a bath or putting clothes  on.  Forgetting appointments or forgetting to pay bills.  Difficulty planning and preparing meals.  Having trouble speaking.  Getting lost easily.  Changes in behavior or mood. How is this diagnosed? This condition is diagnosed by a specialist (neurologist). It is diagnosed based on the history of your symptoms, your medical history, a physical exam, and tests. Tests may include:  Tests to evaluate brain function, such as memory tests, cognitive tests, and other tests.  Lab tests, such as blood or urine tests.  Imaging tests, such as a CT scan, a PET scan, or an MRI.  Genetic testing. This may be done if other family members have a diagnosis of certain types of dementia. Your health care provider will talk with you and your family, friends, or caregivers about your history and symptoms.   How is this treated? Treatment for this condition depends on the cause of the dementia. Progressive dementias, such as Alzheimer's disease, cannot be cured, but there may be treatments that help to manage symptoms. Treatment might involve taking medicines that may help to:  Control the dementia.  Slow down the progression of the dementia.  Manage symptoms. In some cases, treating the cause of your dementia can improve symptoms, reverse symptoms, or slow down how quickly your dementia becomes worse. Your health care provider can direct you to support groups, organizations, and other health care providers who can help with decisions about your care. Follow these instructions at home: Medicines  Take over-the-counter and prescription medicines only as told by your health care provider.  Use a pill organizer or pill reminder to help you manage your medicines.  Avoid taking  medicines that can affect thinking, such as pain medicines or sleeping medicines. Lifestyle  Make healthy lifestyle choices. ? Be physically active as told by your health care provider. ? Do not use any products that  contain nicotine or tobacco, such as cigarettes, e-cigarettes, and chewing tobacco. If you need help quitting, ask your health care provider. ? Do not drink alcohol. ? Practice stress-management techniques when you get stressed. ? Spend time with other people.  Make sure to get quality sleep. These tips can help you get a good night's rest: ? Avoid napping during the day. ? Keep your sleeping area dark and cool. ? Avoid exercising during the few hours before you go to bed. ? Avoid caffeine products in the evening. Eating and drinking  Drink enough fluid to keep your urine pale yellow.  Eat a healthy diet. General instructions  Work with your health care provider to determine what you need help with and what your safety needs are.  Talk with your health care provider about whether it is safe for you to drive.  If you were given a bracelet that identifies you as a person with memory loss or tracks your location, make sure to wear it at all times.  Work with your family to make important decisions, such as advance directives, medical power of attorney, or a living will.  Keep all follow-up visits. This is important.   Where to find more information  Alzheimer's Association: [employer]nz  National Institute on Aging: DVDEnthusiasts.nl  World Health Organization: RoleLink.com.br Contact a health care provider if:  You have any new or worsening symptoms.  You have problems with choking or swallowing. Get help right away if:  You feel depressed or sad, or feel that you want to harm yourself.  Your family members become concerned for your safety. If you ever feel like you may hurt yourself or others, or have thoughts about taking your own life, get help right away. Go to your nearest emergency department or:  Call your local emergency services (911 in the [place]  Call a suicide crisis helpline, such as the [place] at (979) 511-3790. This is open  24 hours a day in the [place]  Text the Crisis Text Line at 240-556-8599 (in the [place].). Summary  Dementia is a condition that affects the way the brain functions. Dementia often affects memory and thinking.  Usually, dementia gets worse with time and cannot be reversed (progressive dementia).  Treatment for this condition depends on the cause of the dementia.  Work with your health care provider to determine what you need help with and what your safety needs are.  Your health care provider can direct you to support groups, organizations, and other health care providers who can help with decisions about your care. This information is not intended to replace advice given to you by your health care provider. Make sure you discuss any questions you have with your health care provider. Document Revised: 05/26/2019 Document Reviewed: 05/26/2019 Elsevier Patient Education  [place].

## 2020-06-16 NOTE — Progress Notes (Signed)
Subjective:  Patient ID: Vanessa Fuentes, female    DOB: [birth year]  Age: 75 y.o. MRN: [id number]  CC: Follow-up (6 month f/u)  This visit occurred during the SARS-CoV-2 public health emergency.  Safety protocols were in place, including screening questions prior to the visit, additional usage of staff PPE, and extensive cleaning of exam room while observing appropriate contact time as indicated for disinfecting solutions.    HPI Vanessa Fuentes presents for f/up -   She is with her husband today.  He brings her in because he and their daughter are concerned that her memory has been declining over the last few months.  It something they have noticed very gradually.  She is not aware of it and becomes defensive when it is discussed.  She denies headache, blurred vision, paresthesias, depression, and anxiety.  Outpatient Medications Prior to Visit  Medication Sig Dispense Refill  . alendronate (FOSAMAX) 70 MG tablet alendronate 70 mg tablet  TK 1 T PO Q WK    . omeprazole (PRILOSEC) 40 MG capsule Take 1 capsule (40 mg total) by mouth daily. 90 capsule 1  . valsartan (DIOVAN) 160 MG tablet Take 1 tablet (160 mg total) by mouth daily. 90 tablet 1  . rosuvastatin (CRESTOR) 5 MG tablet TAKE 1 TABLET(5 MG) BY MOUTH AT BEDTIME (Patient not taking: Reported on 06/16/2020) 90 tablet 1   No facility-administered medications prior to visit.    ROS Review of Systems  Constitutional: Negative for diaphoresis, fatigue and unexpected weight change.  HENT: Negative.   Eyes: Negative for visual disturbance.  Respiratory: Negative for cough, chest tightness, shortness of breath and wheezing.   Cardiovascular: Negative for chest pain, palpitations and leg swelling.  Gastrointestinal: Negative for abdominal pain, constipation, diarrhea, nausea and vomiting.  Endocrine: Negative.   Genitourinary: Negative.   Musculoskeletal: Negative.  Negative for arthralgias, back pain, myalgias and neck pain.   Skin: Negative.   Neurological: Negative for dizziness, facial asymmetry, speech difficulty, weakness, light-headedness, numbness and headaches.  Psychiatric/Behavioral: Positive for decreased concentration. Negative for behavioral problems, confusion and suicidal ideas. The patient is not nervous/anxious.     Objective:  BP 130/82 (BP Location: Left Arm)   Pulse 71   Temp 98.5 F (36.9 C) (Oral)   Wt 163 lb 3.2 oz (74 kg)   SpO2 96%   BMI 28.91 kg/m   BP Readings from Last 3 Encounters:  06/16/20 130/82  07/31/19 118/70  07/31/19 128/80    Wt Readings from Last 3 Encounters:  06/16/20 163 lb 3.2 oz (74 kg)  07/31/19 163 lb (73.9 kg)  07/31/19 167 lb (75.8 kg)    Physical Exam HENT:     Nose: Nose normal.     Mouth/Throat:     Mouth: Mucous membranes are moist.  Eyes:     General: No scleral icterus.    Conjunctiva/sclera: Conjunctivae normal.  Cardiovascular:     Rate and Rhythm: Normal rate and regular rhythm.     Heart sounds: No murmur heard.   Pulmonary:     Effort: Pulmonary effort is normal.     Breath sounds: No stridor. No wheezing, rhonchi or rales.  Abdominal:     General: Abdomen is flat.     Palpations: There is no mass.     Tenderness: There is no abdominal tenderness. There is no guarding.  Musculoskeletal:        General: Normal range of motion.     Cervical back: Neck supple.  Lymphadenopathy:     Cervical: No cervical adenopathy.  Skin:    General: Skin is warm and dry.  Neurological:     Mental Status: She is alert.     Cranial Nerves: Cranial nerves are intact. No cranial nerve deficit or dysarthria.     Sensory: No sensory deficit.     Motor: Motor function is intact. No tremor, atrophy or seizure activity.     Coordination: Romberg sign negative. Coordination normal. Heel to Shin Test abnormal. Finger-Nose-Finger Test normal. Rapid alternating movements normal.     Deep Tendon Reflexes: Reflexes normal.     Reflex Scores:       Tricep reflexes are 0 on the right side and 0 on the left side.      Bicep reflexes are 0 on the right side and 0 on the left side.      Brachioradialis reflexes are 0 on the right side and 0 on the left side.      Patellar reflexes are 1+ on the right side and 1+ on the left side.      Achilles reflexes are 0 on the right side and 0 on the left side.    Comments: She correctly identified the year and the president.  She was not willing to tell me the month, day, or day of the week.  Psychiatric:        Mood and Affect: Mood normal.        Behavior: Behavior normal.        Thought Content: Thought content normal.        Judgment: Judgment normal.     Lab Results  Component Value Date   WBC 7.2 06/16/2020   HGB 14.2 06/16/2020   HCT 41.8 06/16/2020   PLT 186.0 06/16/2020   GLUCOSE 100 (H) 07/03/2019   CHOL 183 06/16/2020   TRIG 89.0 06/16/2020   HDL 66.10 06/16/2020   LDLDIRECT 148.3 04/13/2011   LDLCALC 99 06/16/2020   ALT 14 06/16/2020   AST 19 06/16/2020   NA 138 07/03/2019   K 4.0 07/03/2019   CL 103 07/03/2019   CREATININE 0.84 07/03/2019   BUN 12 07/03/2019   CO2 28 07/03/2019   TSH 0.97 06/16/2020   HGBA1C 5.0 08/10/2015    DG Chest 2 View  Result Date: 10/30/2016 CLINICAL DATA:  Cough, chills, fatigue x 10 days, HTN, EXAM: CHEST  2 VIEW COMPARISON:  08/04/2009 FINDINGS: Midline trachea. Normal heart size. Tortuous thoracic aorta. No pleural effusion or pneumothorax. Clear lungs. IMPRESSION: No acute cardiopulmonary disease. Electronically Signed   By: Abigail Miyamoto M.D.   On: 10/30/2016 12:03    Assessment & Plan:   Beatric was seen today for follow-up.  Diagnoses and all orders for this visit:  Essential hypertension- Her blood pressure is adequately well controlled. -     CBC with Differential/Platelet; Future -     Thyroid Panel With TSH; Future -     Hepatic function panel; Future -     Hepatic function panel -     Thyroid Panel With TSH -     CBC  with Differential/Platelet  Hyperlipidemia with target LDL less than 130- Shas achieved her LDL goal and is doing well on the statin. -     Lipid panel; Future -     Thyroid Panel With TSH; Future -     Hepatic function panel; Future -     Hepatic function panel -     Thyroid Panel  With TSH -     Lipid panel  Memory difficulties- Her labs are all reassuring.  She had some difficulty with heel-to-shin.  I recommended that she undergo an MRI of the brain without contrast to see if there is a structural lesion like NPH, mass, CVA, or bleed.  I am also concerned she has Alzheimer's dementia so I recommended that she see neurology. -     Vitamin B12; Future -     Thyroid Panel With TSH; Future -     Folate; Future -     RPR; Future -     MR Brain Wo Contrast; Future -     Ambulatory referral to Neurology -     RPR -     Folate -     Thyroid Panel With TSH -     Vitamin B12   I am having [name] maintain her alendronate, rosuvastatin, valsartan, and omeprazole.  No orders of the defined types were placed in this encounter.    Follow-up: Return in about 6 months (around 12/17/2020).  Scarlette Calico, MD

## 2020-06-17 LAB — THYROID PANEL WITH TSH
Free Thyroxine Index: 3.1 (ref 1.4–3.8)
T3 Uptake: 30 % (ref 22–35)
T4, Total: 10.2 ug/dL (ref 5.1–11.9)
TSH: 0.97 mIU/L (ref 0.40–4.50)

## 2020-06-17 LAB — RPR: RPR Ser Ql: NONREACTIVE

## 2020-06-22 ENCOUNTER — Telehealth: Payer: Self-pay | Admitting: Internal Medicine

## 2020-06-22 NOTE — Progress Notes (Signed)
  Chronic Care Management   Note  06/22/2020 Name: Vanessa Fuentes MRN: [id number] DOB: 31-Jan-1945  Vanessa Fuentes is a 75 y.o. year old female who is a primary care patient of Janith Lima, MD. I reached out to [name] by phone today in response to a referral sent by Ms. Ray Church PCP, Janith Lima, MD.   Ms. Huertas was given information about Chronic Care Management services today including:  1. CCM service includes personalized support from designated clinical staff supervised by her physician, including individualized plan of care and coordination with other care providers 2. 24/7 contact phone numbers for assistance for urgent and routine care needs. 3. Service will only be billed when office clinical staff spend 20 minutes or more in a month to coordinate care. 4. Only one practitioner may furnish and bill the service in a calendar month. 5. The patient may stop CCM services at any time (effective at the end of the month) by phone call to the office staff.   Patient agreed to services and verbal consent obtained.   Follow up plan:   Lauretta Grill [employer] Scheduler

## 2020-07-05 ENCOUNTER — Encounter: Payer: Self-pay | Admitting: Diagnostic Neuroimaging

## 2020-07-05 ENCOUNTER — Ambulatory Visit (INDEPENDENT_AMBULATORY_CARE_PROVIDER_SITE_OTHER): Payer: Medicare Other | Admitting: Diagnostic Neuroimaging

## 2020-07-05 ENCOUNTER — Other Ambulatory Visit: Payer: Self-pay

## 2020-07-05 VITALS — BP 154/86 | HR 71 | Ht 66.0 in | Wt 164.0 lb

## 2020-07-05 DIAGNOSIS — R413 Other amnesia: Secondary | ICD-10-CM | POA: Diagnosis not present

## 2020-07-05 NOTE — Progress Notes (Signed)
[facility]  PATIENT: Vanessa Fuentes DOB: [birth year]  REFERRING CLINICIAN: Janith Lima, MD HISTORY FROM: patient and husband  REASON FOR VISIT: new consult    HISTORICAL  CHIEF COMPLAINT:  Chief Complaint  Patient presents with   Memory difficulties    Rm 6 New Pt husband- Clair Gulling  "short term memory problems"  MMSE 19    HISTORY OF PRESENT ILLNESS:   75 year old female here for evaluation of memory loss.  For past 61-monthpatient's had onset of short-term memory loss and confusion, poor insight, decline in ADLs.  Symptoms noted mainly by family.  Patient went to PCP for evaluation and then referred to neurology.  No headaches, numbness, weakness, gait or balance issues.   REVIEW OF SYSTEMS: Full 14 system review of systems performed and negative with exception of: As per HPI.  ALLERGIES: Allergies  Allergen Reactions   Sulfonamide Derivatives     Blisters eyes    HOME MEDICATIONS: Outpatient Medications Prior to Visit  Medication Sig Dispense Refill   Cholecalciferol (VITAMIN D-3 PO) Take by mouth. Dose unknown     omeprazole (PRILOSEC) 40 MG capsule Take 1 capsule (40 mg total) by mouth daily. 90 capsule 1   UNABLE TO FIND 2 tablets daily. Med Name: Promal memory and brain helth     valsartan (DIOVAN) 160 MG tablet Take 1 tablet (160 mg total) by mouth daily. 90 tablet 1   alendronate (FOSAMAX) 70 MG tablet alendronate 70 mg tablet  TK 1 T PO Q WK (Patient not taking: Reported on 07/05/2020)     rosuvastatin (CRESTOR) 5 MG tablet TAKE 1 TABLET(5 MG) BY MOUTH AT BEDTIME (Patient not taking: No sig reported) 90 tablet 1   No facility-administered medications prior to visit.    PAST MEDICAL HISTORY: Past Medical History:  Diagnosis Date   Brachial neuritis or radiculitis NOS    Cervicalgia    Degeneration of cervical intervertebral disc    Degeneration of lumbar or lumbosacral intervertebral disc    Dysfunction of eustachian tube     Osteoporosis 2017   Spinal stenosis in cervical region    Unspecified essential hypertension     PAST SURGICAL HISTORY: Past Surgical History:  Procedure Laterality Date   CERVICAL DISCECTOMY  07/2009   C3-4 with fusion- Dr PJanine Ores  COLONOSCOPY     [place] SUR[place]     TUBAL LIGATION      FAMILY HISTORY: Family History  Problem Relation Age of Onset   Hypertension Mother    Heart disease Mother        CAD MI CABG   Cancer Mother    Heart disease Father        CAD MI   Hypertension Father    Dementia Brother    Heart disease Brother        CAD   Esophageal cancer Brother    COPD Brother    Cancer Maternal Aunt        Breast   Diabetes Neg Hx    Colon cancer Neg Hx    Stomach cancer Neg Hx     SOCIAL HISTORY: Social History   Socioeconomic History   Marital status: Married    Spouse name: JClair Gulling  Number of children: 1   Years of education: Not on file   Highest education level: Some college, no degree  Occupational History   Occupation: retired  Tobacco Use   Smoking status: Former    Pack years: 0.00    Types: Cigarettes    Quit date: 02/16/1981    Years since quitting: 39.4   Smokeless tobacco: Never   Tobacco comments:    During Adolescence  Substance and Sexual Activity   Alcohol use: Yes    Alcohol/week: 2.0 standard drinks    Types: 2 Standard drinks or equivalent per week   Drug use: No   Sexual activity: Yes    Partners: Male  Other Topics Concern   Not on file  Social History Narrative   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 Married in 1969, one daughter-'73 and 2 grandchildren.  Work- retired from  [employer] October '12 after 43 years. Life is good.    Social Determinants of Health   Financial Resource Strain: Low Risk    Difficulty of Paying Living Expenses: Not hard at all  Food Insecurity: No Food Insecurity   Worried About Running Out of Food in the Last Year: Never true   Ran Out of Food in  the Last Year: Never true  Transportation Needs: No Transportation Needs   Lack of Transportation (Medical): No   Lack of Transportation (Non-Medical): No  Physical Activity: Not on file  Stress: No Stress Concern Present   Feeling of Stress : Not at all  Social Connections: Socially Integrated   Frequency of Communication with Friends and Family: More than three times a week   Frequency of Social Gatherings with Friends and Family: More than three times a week   Attends Religious Services: More than 4 times per year   Active Member of Clubs or Organizations: Yes   Attends Club or Organization Meetings: More than 4 times per year   Marital Status: Married  Intimate Partner Violence: Not on file     PHYSICAL EXAM  GENERAL EXAM/CONSTITUTIONAL: Vitals:  Vitals:   07/05/20 1019  BP: (!) 154/86  Pulse: 71  Weight: 164 lb (74.4 kg)  Height: 5' 6" (1.676 m)   Body mass index is 26.47 kg/m. Wt Readings from Last 3 Encounters:  07/05/20 164 lb (74.4 kg)  06/16/20 163 lb 3.2 oz (74 kg)  07/31/19 163 lb (73.9 kg)   Patient is in no distress; well developed, nourished and groomed; neck is supple  CARDIOVASCULAR: Examination of carotid arteries is normal; no carotid bruits Regular rate and rhythm, no murmurs Examination of peripheral vascular system by observation and palpation is normal  EYES: Ophthalmoscopic exam of optic discs and posterior segments is normal; no papilledema or hemorrhages No results found.  MUSCULOSKELETAL: Gait, strength, tone, movements noted in Neurologic exam below  NEUROLOGIC: MENTAL STATUS:  MMSE - Mini Mental State Exam 07/05/2020  Orientation to time 3  Orientation to Place 4  Registration 3  Attention/ Calculation 1  Recall 0  Language- name 2 objects 2  Language- repeat 0  Language- follow 3 step command 3  Language- read & follow direction 1  Write a sentence 1  Copy design 1  Total score 19   awake, alert, oriented to person, place  and time recent and remote memory intact normal attention and concentration language fluent, comprehension intact, naming intact fund of knowledge appropriate  CRANIAL NERVE:  2nd - no papilledema on fundoscopic exam 2nd, 3rd, 4th, 6th - pupils equal and reactive to light, visual fields full to confrontation, extraocular muscles intact, no nystagmus 5th - facial sensation symmetric 7th - facial strength symmetric 8th - hearing intact 9th - palate   elevates symmetrically, uvula midline 11th - shoulder shrug symmetric 12th - tongue protrusion midline  MOTOR:  normal bulk and tone, full strength in the BUE, BLE  SENSORY:  normal and symmetric to light touch, temperature, vibration  COORDINATION:  finger-nose-finger, fine finger movements normal  REFLEXES:  deep tendon reflexes TRACE and symmetric  GAIT/STATION:  narrow based gait     DIAGNOSTIC DATA (LABS, IMAGING, TESTING) - I reviewed patient records, labs, notes, testing and imaging myself where available.  Lab Results  Component Value Date   WBC 7.2 06/16/2020   HGB 14.2 06/16/2020   HCT 41.8 06/16/2020   MCV 89.4 06/16/2020   PLT 186.0 06/16/2020      Component Value Date/Time   NA 138 07/03/2019 1120   K 4.0 07/03/2019 1120   CL 103 07/03/2019 1120   CO2 28 07/03/2019 1120   GLUCOSE 100 (H) 07/03/2019 1120   BUN 12 07/03/2019 1120   CREATININE 0.84 07/03/2019 1120   CALCIUM 9.4 07/03/2019 1120   PROT 7.2 06/16/2020 1122   ALBUMIN 4.5 06/16/2020 1122   AST 19 06/16/2020 1122   ALT 14 06/16/2020 1122   ALKPHOS 57 06/16/2020 1122   BILITOT 0.7 06/16/2020 1122   GFRNONAA >60 08/04/2009 0852   GFRAA  08/04/2009 0852    >60        The eGFR has been calculated using the MDRD equation. This calculation has not been validated in all clinical situations. eGFR's persistently <60 mL/min signify possible Chronic Kidney Disease.   Lab Results  Component Value Date   CHOL 183 06/16/2020   HDL 66.10  06/16/2020   LDLCALC 99 06/16/2020   LDLDIRECT 148.3 04/13/2011   TRIG 89.0 06/16/2020   CHOLHDL 3 06/16/2020   Lab Results  Component Value Date   HGBA1C 5.0 08/10/2015   Lab Results  Component Value Date   JYNWGNFA21 308 06/16/2020   Lab Results  Component Value Date   TSH 0.97 06/16/2020       ASSESSMENT AND PLAN  75 y.o. year old female here with progressive short-term memory loss confusion, decline in ADLs since January 2022, likely representing neurodegenerative dementia.  Proceed with further work-up and brain healthy activities.  Dx:  1. Memory loss      PLAN:  MEMORY LOSS (MMSE 19/30; decreased insight; slight change in more complex ADLs) - follow up MRI brain (ordered by PCP) - consider memantine / donepezil (patient reluctant for meds) - safety / supervision issues reviewed - daily physical activity / exercise (at least 15-30 minutes) - eat more plants / vegetables - increase social activities, brain stimulation, games, puzzles, hobbies, crafts, arts, music - aim for at least 7-8 hours sleep per night (or more) - avoid smoking and alcohol - caregiver resources provided - caution with medications, finances, driving  Return for pending if symptoms worsen or fail to improve, pending test results.    Penni Bombard, MD 6/57/8469, 62:95 AM Certified in Neurology, Neurophysiology and Neuroimaging  [facility] [address] [place], [place] [address] [phone]

## 2020-07-05 NOTE — Patient Instructions (Signed)
MEMORY LOSS - follow up MRI brain (ordered by PCP) - safety / supervision issues reviewed - daily physical activity / exercise (at least 15-30 minutes) - eat more plants / vegetables - increase social activities, brain stimulation, games, puzzles, hobbies, crafts, arts, music - aim for at least 7-8 hours sleep per night (or more) - avoid smoking and alcohol - caregiver resources provided - caution with medications, finances, driving

## 2020-07-07 ENCOUNTER — Encounter: Payer: Self-pay | Admitting: Internal Medicine

## 2020-07-12 ENCOUNTER — Encounter: Payer: Self-pay | Admitting: Diagnostic Neuroimaging

## 2020-07-16 ENCOUNTER — Other Ambulatory Visit: Payer: Self-pay

## 2020-07-16 ENCOUNTER — Ambulatory Visit
Admission: RE | Admit: 2020-07-16 | Discharge: 2020-07-16 | Disposition: A | Discharge status: Home / Self Care | Payer: Medicare Other | Source: Ambulatory Visit | Attending: Internal Medicine | Admitting: Internal Medicine

## 2020-07-16 DIAGNOSIS — I739 Peripheral vascular disease, unspecified: Secondary | ICD-10-CM | POA: Diagnosis not present

## 2020-07-16 DIAGNOSIS — R413 Other amnesia: Secondary | ICD-10-CM

## 2020-07-16 DIAGNOSIS — Z9889 Other specified postprocedural states: Secondary | ICD-10-CM | POA: Diagnosis not present

## 2020-07-16 MED ORDER — GADOBENATE DIMEGLUMINE 529 MG/ML IV SOLN: 20.0000 mL | Freq: Once | INTRAVENOUS | Status: DC | PRN

## 2020-07-21 ENCOUNTER — Telehealth: Payer: Self-pay | Admitting: Internal Medicine

## 2020-07-21 NOTE — Telephone Encounter (Signed)
   Patients husband is requesting a call back in regards to recent MRI results. He can be reached at [phone]

## 2020-07-22 NOTE — Telephone Encounter (Signed)
Please advise on what you would like me to inform the husband. Per Epic it looks like you are waiting for a response in regard to vascular dementia.

## 2020-07-23 NOTE — Telephone Encounter (Signed)
Called pt, LVM to discuss.  

## 2020-07-28 NOTE — Progress Notes (Signed)
Chronic Care Management Pharmacy Note  08/02/2020 Name:  Vanessa Fuentes MRN:  [id number] DOB:  Jul 08, 1945  Summary: - Patient reports that she is doing well, accompanied today by her son.  Patient had recent neurology appointment, follow up later on this month after MRI to discuss options regarding her memory, otherwise no complaints, patient reports no issues or AE from medications  Recommendations/Changes made from today's visit: - Patient to start monitoring blood pressure at least once weekly - patient to reach out should BP average >140/90 or if she has any issues with hypotension  - Patient/ Son to confirm medications that she is taking (alendronate and rosuvastatin) if patient is out of medication will contact office if refills are needed by pharmacy - Continue exercise and dietary patterns to help keep blood pressure and LDL under control   Subjective: Vanessa Fuentes is an 75 y.o. year old female who is a primary patient of Janith Lima, MD.  The CCM team was consulted for assistance with disease management and care coordination needs.    Engaged with patient face to face for initial visit in response to provider referral for pharmacy case management and/or care coordination services.   Consent to Services:  The patient was given the following information about Chronic Care Management services today, agreed to services, and gave verbal consent: 1. CCM service includes personalized support from designated clinical staff supervised by the primary care provider, including individualized plan of care and coordination with other care providers 2. 24/7 contact phone numbers for assistance for urgent and routine care needs. 3. Service will only be billed when office clinical staff spend 20 minutes or more in a month to coordinate care. 4. Only one practitioner may furnish and bill the service in a calendar month. 5.The patient may stop CCM services at any time (effective at the end of  the month) by phone call to the office staff. 6. The patient will be responsible for cost sharing (co-pay) of up to 20% of the service fee (after annual deductible is met). Patient agreed to services and consent obtained.  Patient Care Team: Janith Lima, MD as PCP - General (Internal Medicine) Delice Bison Darnelle Maffucci, [facility] as Pharmacist (Pharmacist)  Recent office visits: 06/16/2020 - PCP visit - concern with memory decline over the last few months - MRI ordered, referred to neurology for further evaluation   Recent consult visits: 07/05/2020 - Dr. Leta Baptist - Neurology - memory loss - 6 month issues with memory loss, confusion, poor insight, and decline in ADLs  - discussed completion of MRI ordered by PCP - possibility to start memantine or donepezil - patient declined at this time  10/30/2019 - Dr. Iona Hansen - Ophthalmology  09/30/2019 - Dr. Ronnald Ramp - dermatology   [facility] visits: None in previous 6 months  Objective:  Lab Results  Component Value Date   CREATININE 0.84 07/03/2019   BUN 12 07/03/2019   GFR 66.38 07/03/2019   GFRNONAA >60 08/04/2009   GFRAA  08/04/2009    >60        The eGFR has been calculated using the MDRD equation. This calculation has not been validated in all clinical situations. eGFR's persistently <60 mL/min signify possible Chronic Kidney Disease.   NA 138 07/03/2019   K 4.0 07/03/2019   CALCIUM 9.4 07/03/2019   CO2 28 07/03/2019   GLUCOSE 100 (H) 07/03/2019    Lab Results  Component Value Date/Time   HGBA1C 5.0 08/10/2015 01:35 PM   HGBA1C  5.1 04/23/2013 11:29 AM   GFR 66.38 07/03/2019 11:20 AM   GFR 71.37 11/27/2018 04:07 PM    Last diabetic Eye exam:  No results found for: HMDIABEYEEXA  Last diabetic Foot exam:  No results found for: HMDIABFOOTEX   Lab Results  Component Value Date   CHOL 183 06/16/2020   HDL 66.10 06/16/2020   LDLCALC 99 06/16/2020   LDLDIRECT 148.3 04/13/2011   TRIG 89.0 06/16/2020   CHOLHDL 3 06/16/2020    Hepatic  Function Latest Ref Rng & Units 06/16/2020 11/27/2018 11/27/2017  Total Protein 6.0 - 8.3 g/dL 7.2 6.6 6.7  Albumin 3.5 - 5.2 g/dL 4.5 4.3 4.3  AST 0 - 37 U/L '19 14 17  ' ALT 0 - 35 U/L '14 12 18  ' Alk Phosphatase 39 - 117 U/L 57 49 46  Total Bilirubin 0.2 - 1.2 mg/dL 0.7 0.4 0.6  Bilirubin, Direct 0.0 - 0.3 mg/dL 0.1 0.1 -    Lab Results  Component Value Date/Time   TSH 0.97 06/16/2020 11:22 AM   TSH 0.85 07/03/2019 11:20 AM    CBC Latest Ref Rng & Units 06/16/2020 07/03/2019 11/27/2018  WBC 4.0 - 10.5 K/uL 7.2 6.2 6.6  Hemoglobin 12.0 - 15.0 g/dL 14.2 14.2 13.6  Hematocrit 36.0 - 46.0 % 41.8 41.7 40.2  Platelets 150.0 - 400.0 K/uL 186.0 172.0 176.0    Lab Results  Component Value Date/Time   VD25OH 34.15 08/10/2015 01:35 PM    Clinical ASCVD: No  The 10-year ASCVD risk score Mikey Bussing DC Jr., et al., 2013) is: 26%   Values used to calculate the score:     Age: 27 years     Sex: Female     Is Non-Hispanic African American: No     Diabetic: No     Tobacco smoker: No     Systolic Blood Pressure: 438 mmHg     Is BP treated: Yes     HDL Cholesterol: 66.1 mg/dL     Total Cholesterol: 183 mg/dL    Depression screen [facility] 2/9 07/31/2019 11/27/2018 11/28/2017  Decreased Interest 0 0 0  Down, Depressed, Hopeless 0 0 0  PHQ - 2 Score 0 0 0  Altered sleeping - - -  Tired, decreased energy - - -  Change in appetite - - -  Feeling bad or failure about yourself  - - -  Trouble concentrating - - -  Moving slowly or fidgety/restless - - -  Suicidal thoughts - - -  PHQ-9 Score - - -     Social History   Tobacco Use  Smoking Status Former   Pack years: 0.00   Types: Cigarettes   Quit date: 02/16/1981   Years since quitting: 39.4  Smokeless Tobacco Never  Tobacco Comments   During Adolescence   BP Readings from Last 3 Encounters:  07/05/20 (!) 154/86  06/16/20 130/82  07/31/19 118/70   Pulse Readings from Last 3 Encounters:  07/05/20 71  06/16/20 71  07/31/19 77   Wt Readings  from Last 3 Encounters:  07/05/20 164 lb (74.4 kg)  06/16/20 163 lb 3.2 oz (74 kg)  07/31/19 163 lb (73.9 kg)   BMI Readings from Last 3 Encounters:  07/05/20 26.47 kg/m  06/16/20 28.91 kg/m  07/31/19 28.87 kg/m    Assessment/Interventions: Review of patient past medical history, allergies, medications, health status, including review of consultants reports, laboratory and other test data, was performed as part of comprehensive evaluation and provision of chronic care management services.  SDOH:  (Social Determinants of Health) assessments and interventions performed: Yes  SDOH Screenings   Alcohol Screen: Not on file  Depression (PHQ2-9): Not on file  Financial Resource Strain: Low Risk    Difficulty of Paying Living Expenses: Not hard at all  Food Insecurity: Not on file  Housing: Not on file  Physical Activity: Not on file  Social Connections: Not on file  Stress: Not on file  Tobacco Use: Medium Risk   Smoking Tobacco Use: Former   Smokeless Tobacco Use: Never  Transportation Needs: Not on file    [place]  Allergies  Allergen Reactions   Sulfonamide Derivatives     Blisters eyes    Medications Reviewed Today     Reviewed by Tomasa Blase, [facility] (Pharmacist) on 08/02/20 at 1124  Med List Status: <None>   Medication Order Taking? Sig Documenting Provider Last Dose Status Informant  alendronate (FOSAMAX) 70 MG tablet [id number] No alendronate 70 mg tablet  TK 1 T PO Q WK  Patient not taking: No sig reported   [provider] Unknown Active   Cholecalciferol (VITAMIN D-3 PO) [id number] Yes Take 1,000 Units by mouth daily. [provider] Taking Active   omeprazole (PRILOSEC) 40 MG capsule [id number] Yes Take 1 capsule (40 mg total) by mouth daily. Janith Lima, MD Taking Active   rosuvastatin (CRESTOR) 5 MG tablet [id number] Yes TAKE 1 TABLET(5 MG) BY MOUTH AT BEDTIME Janith Lima, MD Taking Active   UNABLE TO FIND [id number] Yes 2  tablets daily. Med Name: CogniForce by primal labs [provider] Taking Active   valsartan (DIOVAN) 160 MG tablet [id number] Yes Take 1 tablet (160 mg total) by mouth daily. Janith Lima, MD Taking Active             Patient Active Problem List   Diagnosis Date Noted   Memory difficulties 06/16/2020   Lumbar radiculopathy 06/13/2018   Chronic midline low back pain 10/17/2016   Gastroesophageal reflux disease with esophagitis 03/21/2016   IBS (irritable bowel syndrome) 07/20/2014   Osteoporosis 04/24/2013   Hyperlipidemia with target LDL less than 130 04/14/2011   Routine general medical examination at a health care facility 08/04/2010   Essential hypertension 10/13/2008   DEGENERATIVE [place], LUMBAR SPINE 10/13/2008    Immunization History  Administered Date(s) Administered   Fluad Quad(high Dose 65+) 11/12/2018   Influenza Split 11/13/2013   Influenza Whole 12/22/2009, 10/17/2011   Influenza, High Dose Seasonal PF 09/06/2012, 09/13/2014, 10/17/2016, 11/27/2017, 10/24/2018   Influenza-Unspecified 09/13/2014   Moderna Sars-Covid-2 Vaccination 03/03/2019, 03/31/2019   Pneumococcal Conjugate-13 11/13/2013   Pneumococcal Polysaccharide-23 09/06/2012   Td 12/23/1996   Tdap 04/23/2013   Zoster, Live 12/22/2009    Conditions to be addressed/monitored:  Hypertension, Hyperlipidemia, GERD, and Osteoporosis  Care Plan : CCM Care Plan  Updates made by Tomasa Blase, [place] since 08/02/2020 12:00 AM     Problem: HTN, HLD, Osteoporosis, GERD   Priority: High  Onset Date: 08/02/2020     Long-Range Goal: Disease Management   Start Date: 08/02/2020  Expected End Date: 02/02/2021  This Visit's Progress: On track  Priority: High  Note:   Current Barriers:  Unable to independently monitor therapeutic efficacy  Pharmacist Clinical Goal(s):  Patient will achieve adherence to monitoring guidelines and medication adherence to achieve therapeutic efficacy maintain  control of BP and LDL as evidenced by blood pressure logs and next lipid panel results   adhere to prescribed medication regimen as  evidenced by taking medications as directed by using weekly pill box through collaboration with PharmD and provider.   Interventions: 1:1 collaboration with Janith Lima, MD regarding development and update of comprehensive plan of care as evidenced by provider attestation and co-signature Inter-disciplinary care team collaboration (see longitudinal plan of care) Comprehensive medication review performed; medication list updated in electronic medical record  Hypertension (BP goal <140/90) -Controlled  -Current treatment: Valsartan 151m daily  -Medications previously tried: furosemide, indapamide  -Most recent office visit readings: 154/86 (first appointment with neruology), 130/82, and 118/70 - patient not currently checking at home but son reports they do have a BP cuff  -Current dietary habits: eats a low sodium diet  -Current exercise habits: walking typically 4 days a week - for 15-20 minutes at a time  -Denies hypotensive/hypertensive symptoms -Educated on BP goals and benefits of medications for prevention of heart attack, stroke and kidney damage; Daily salt intake goal < 2300 mg; Importance of home blood pressure monitoring; Proper BP monitoring technique; Symptoms of hypotension and importance of maintaining adequate hydration; -Counseled to monitor BP at home 1-2 times weekly, document, and provide log at future appointments -Counseled on diet and exercise extensively Recommended to continue current medication  Hyperlipidemia: (LDL goal < 130) -Controlled -Last LDL 99 mg/dL (06/16/2020) -Current treatment: Rosuvastatin 539mdaily -Medications previously tried: n/a  -Current dietary patterns: reports to a diet that is low in fried/ fatty foods / red meats  -Current exercise habits: walking 4 times weekly for about 15-20 minutes at a time   -Educated on Cholesterol goals;  Benefits of statin for ASCVD risk reduction; Importance of limiting foods high in cholesterol; Exercise goal of 150 minutes per week; -Counseled on diet and exercise extensively Recommended to continue current medication  Osteoporosis / Osteopenia (Goal prevention of fractures / disease progression) -Uncontrolled -Last DEXA Scan: 04/25/2019   T-Score right femoral neck: -2.8  T-Score left femoral neck: -2.1  T-Score right total femur: -2.1  T-Score left total femur: -1.7   T-Score lumbar spine: -0.9 -Patient is a candidate for pharmacologic treatment due to T-Score < -2.5 in femoral neck -Current treatment  Alendronate 7010m 1 tablet weekly - unsure if currently taking  Vitamin D3 1000units - 1 capsule daily  -Medications previously tried: n/a   -Recommend [phone] units of vitamin D daily. Counseled on oral bisphosphonate administration: take in the morning, 30 minutes prior to food with 6-8 oz of water. Do not lie down for at least 30 minutes after taking. -Recommended to continue current medication - patient and son will confirm that patient is taking alendronate - holding on calcium supplementation due to previous hypercalcemia  GERD (Goal: Acid control / prevention of symptoms) -Controlled -Current treatment  Omeprazole 54m62mily  -Medications previously tried: n/a  -Counseled on diet and exercise extensively Recommended to continue current medication  Health Maintenance -Vaccine gaps: Shingles Vaccine and COVID booster  -Current therapy:  CogniForce (memory support)- 2 tablets daily  -Educated on Herbal supplement research is limited and benefits usually cannot be proven Cost vs benefit of each product must be carefully weighed by individual consumer Supplements may interfere with prescription drugs -Patient is satisfied with current therapy and denies issues -Recommended to continue current medication  Patient Goals/Self-Care  Activities Patient will:  - take medications as prescribed focus on medication adherence by taking medications as prescribed check blood pressure 1-2 times weekly, document, and provide at future appointments  Follow Up Plan: Telephone follow up appointment with  care management team member scheduled for: The patient has been provided with contact information for the care management team and has been advised to call with any health related questions or concerns.         Medication Assistance: None required.  Patient affirms current coverage meets needs.  Compliance/Adherence/Medication fill history: Care Gaps: Colonoscopy   Patient's preferred pharmacy is:  [facility] - [place], [place] - [address] [place] [address] Phone: [phone] Fax: [phone]   Uses pill box? Yes Pt endorses 90-100% compliance  Care Plan and Follow Up Patient Decision:  Patient agrees to Care Plan and Follow-up.  Plan: Telephone follow up appointment with care management team member scheduled for:  2 months  and The patient has been provided with contact information for the care management team and has been advised to call with any health related questions or concerns.   Tomasa Blase, PharmD Clinical Pharmacist, [place]

## 2020-07-29 NOTE — Telephone Encounter (Signed)
Pt's husband, Clair Gulling, has been informed and expressed understanding. Will reach out to neurology.

## 2020-08-02 ENCOUNTER — Other Ambulatory Visit: Payer: Self-pay

## 2020-08-02 ENCOUNTER — Other Ambulatory Visit: Payer: Self-pay | Admitting: Internal Medicine

## 2020-08-02 ENCOUNTER — Ambulatory Visit (INDEPENDENT_AMBULATORY_CARE_PROVIDER_SITE_OTHER): Payer: Medicare Other

## 2020-08-02 DIAGNOSIS — K2100 Gastro-esophageal reflux disease with esophagitis, without bleeding: Secondary | ICD-10-CM

## 2020-08-02 DIAGNOSIS — E785 Hyperlipidemia, unspecified: Secondary | ICD-10-CM

## 2020-08-02 DIAGNOSIS — I10 Essential (primary) hypertension: Secondary | ICD-10-CM

## 2020-08-02 NOTE — Patient Instructions (Signed)
Visit Information   PATIENT GOALS:   Goals Addressed             This Visit's Progress    Manage My Medicine       Timeframe:  Long-Range Goal Priority:  High Start Date:   08/02/2020                         Expected End Date:   02/02/2021                  Follow Up Date 09/30/2020   - call for medicine refill 2 or 3 days before it runs out - keep a list of all the medicines I take; vitamins and herbals too - learn to read medicine labels - use a pillbox to sort medicine    Why is this important?   These steps will help you keep on track with your medicines.       Track and Manage My Blood Pressure-Hypertension       Timeframe:  Long-Range Goal Priority:  High Start Date: 08/02/2020                            Expected End Date:  09/30/2020                     Follow Up Date 02/02/2021   - check blood pressure weekly - choose a place to take my blood pressure (home, clinic or office, retail store) - write blood pressure results in a log or diary    Why is this important?   You won't feel high blood pressure, but it can still hurt your blood vessels.  High blood pressure can cause heart or kidney problems. It can also cause a stroke.  Making lifestyle changes like losing a little weight or eating less salt will help.  Checking your blood pressure at home and at different times of the day can help to control blood pressure.  If the doctor prescribes medicine remember to take it the way the doctor ordered.  Call the office if you cannot afford the medicine or if there are questions about it.             Consent to CCM Services: Ms. Quimby was given information about Chronic Care Management services today including:  CCM service includes personalized support from designated clinical staff supervised by her physician, including individualized plan of care and coordination with other care providers 24/7 contact phone numbers for assistance for urgent and routine care  needs. Service will only be billed when office clinical staff spend 20 minutes or more in a month to coordinate care. Only one practitioner may furnish and bill the service in a calendar month. The patient may stop CCM services at any time (effective at the end of the month) by phone call to the office staff. The patient will be responsible for cost sharing (co-pay) of up to 20% of the service fee (after annual deductible is met).  Patient agreed to services and verbal consent obtained.   Patient verbalizes understanding of instructions provided today and agrees to view in [place].   Telephone follow up appointment with care management team member scheduled for: 2 months  The patient has been provided with contact information for the care management team and has been advised to call with any health related questions or concerns.   Tomasa Blase, PharmD  Clinical Pharmacist, [place]    CLINICAL CARE PLAN: Patient Care Plan: CCM Care Plan     Problem Identified: HTN, HLD, Osteoporosis, GERD   Priority: High  Onset Date: 08/02/2020     Long-Range Goal: Disease Management   Start Date: 08/02/2020  Expected End Date: 02/02/2021  This Visit's Progress: On track  Priority: High  Note:   Current Barriers:  Unable to independently monitor therapeutic efficacy  Pharmacist Clinical Goal(s):  Patient will achieve adherence to monitoring guidelines and medication adherence to achieve therapeutic efficacy maintain control of BP and LDL as evidenced by blood pressure logs and next lipid panel results   adhere to prescribed medication regimen as evidenced by taking medications as directed by using weekly pill box through collaboration with PharmD and provider.   Interventions: 1:1 collaboration with Jones, Thomas L, MD regarding development and update of comprehensive plan of care as evidenced by provider attestation and co-signature Inter-disciplinary care team collaboration  (see longitudinal plan of care) Comprehensive medication review performed; medication list updated in electronic medical record  Hypertension (BP goal <140/90) -Controlled  -Current treatment: Valsartan 160mg daily  -Medications previously tried: furosemide, indapamide  -Most recent office visit readings: 154/86 (first appointment with neruology), 130/82, and 118/70 - patient not currently checking at home but son reports they do have a BP cuff  -Current dietary habits: eats a low sodium diet  -Current exercise habits: walking typically 4 days a week - for 15-20 minutes at a time  -Denies hypotensive/hypertensive symptoms -Educated on BP goals and benefits of medications for prevention of heart attack, stroke and kidney damage; Daily salt intake goal < 2300 mg; Importance of home blood pressure monitoring; Proper BP monitoring technique; Symptoms of hypotension and importance of maintaining adequate hydration; -Counseled to monitor BP at home 1-2 times weekly, document, and provide log at future appointments -Counseled on diet and exercise extensively Recommended to continue current medication  Hyperlipidemia: (LDL goal < 130) -Controlled -Last LDL 99 mg/dL (06/16/2020) -Current treatment: Rosuvastatin 5mg daily -Medications previously tried: n/a  -Current dietary patterns: reports to a diet that is low in fried/ fatty foods / red meats  -Current exercise habits: walking 4 times weekly for about 15-20 minutes at a time  -Educated on Cholesterol goals;  Benefits of statin for ASCVD risk reduction; Importance of limiting foods high in cholesterol; Exercise goal of 150 minutes per week; -Counseled on diet and exercise extensively Recommended to continue current medication  Osteoporosis / Osteopenia (Goal prevention of fractures / disease progression) -Uncontrolled -Last DEXA Scan: 04/25/2019   T-Score right femoral neck: -2.8  T-Score left femoral neck: -2.1  T-Score right total  femur: -2.1  T-Score left total femur: -1.7   T-Score lumbar spine: -0.9 -Patient is a candidate for pharmacologic treatment due to T-Score < -2.5 in femoral neck -Current treatment  Alendronate 70mg - 1 tablet weekly - unsure if currently taking  Vitamin D3 1000units - 1 capsule daily  -Medications previously tried: n/a   -Recommend 800-1000 units of vitamin D daily. Counseled on oral bisphosphonate administration: take in the morning, 30 minutes prior to food with 6-8 oz of water. Do not lie down for at least 30 minutes after taking. -Recommended to continue current medication - patient and son will confirm that patient is taking alendronate - holding on calcium supplementation due to previous hypercalcemia  GERD (Goal: Acid control / prevention of symptoms) -Controlled -Current treatment  Omeprazole 40mg daily  -Medications previously tried: n/a  -Counseled   on diet and exercise extensively Recommended to continue current medication  Health Maintenance -Vaccine gaps: Shingles Vaccine and COVID booster  -Current therapy:  CogniForce (memory support)- 2 tablets daily  -Educated on Herbal supplement research is limited and benefits usually cannot be proven Cost vs benefit of each product must be carefully weighed by individual consumer Supplements may interfere with prescription drugs -Patient is satisfied with current therapy and denies issues -Recommended to continue current medication  Patient Goals/Self-Care Activities Patient will:  - take medications as prescribed focus on medication adherence by taking medications as prescribed check blood pressure 1-2 times weekly, document, and provide at future appointments  Follow Up Plan: Telephone follow up appointment with care management team member scheduled for: The patient has been provided with contact information for the care management team and has been advised to call with any health related questions or concerns.

## 2020-08-16 ENCOUNTER — Encounter: Payer: Self-pay | Admitting: Gastroenterology

## 2020-08-18 ENCOUNTER — Ambulatory Visit (INDEPENDENT_AMBULATORY_CARE_PROVIDER_SITE_OTHER): Payer: Medicare Other | Admitting: Diagnostic Neuroimaging

## 2020-08-18 ENCOUNTER — Encounter: Payer: Self-pay | Admitting: Diagnostic Neuroimaging

## 2020-08-18 VITALS — BP 155/88 | HR 75 | Ht 65.0 in | Wt 160.0 lb

## 2020-08-18 DIAGNOSIS — R413 Other amnesia: Secondary | ICD-10-CM | POA: Diagnosis not present

## 2020-08-18 NOTE — Progress Notes (Signed)
[facility]  PATIENT: Vanessa Fuentes DOB: Jul 12, 1945  REFERRING CLINICIAN: Janith Lima, MD HISTORY FROM: patient and husband  REASON FOR VISIT: follow up   HISTORICAL  CHIEF COMPLAINT:  Chief Complaint  Patient presents with   Follow-up    Rm 7 with spouse jimmie  Pt is well and stable, MRI FU     HISTORY OF PRESENT ILLNESS:   UPDATE (08/18/20, VRP): Since last visit, doing about the same. MRI brain reviewed. Patient feels well.   PRIOR HPI: 75 year old female here for evaluation of memory loss.  For past 17-monthpatient's had onset of short-term memory loss and confusion, poor insight, decline in ADLs.  Symptoms noted mainly by family.  Patient went to PCP for evaluation and then referred to neurology.  No headaches, numbness, weakness, gait or balance issues.   REVIEW OF SYSTEMS: Full 14 system review of systems performed and negative with exception of: As per HPI.  ALLERGIES: Allergies  Allergen Reactions   Sulfonamide Derivatives     Blisters eyes    HOME MEDICATIONS: Outpatient Medications Prior to Visit  Medication Sig Dispense Refill   alendronate (FOSAMAX) 70 MG tablet alendronate 70 mg tablet  TK 1 T PO Q WK     Cholecalciferol (VITAMIN D-3 PO) Take 1,000 Units by mouth daily.     omeprazole (PRILOSEC) 40 MG capsule Take 1 capsule (40 mg total) by mouth daily. 90 capsule 1   rosuvastatin (CRESTOR) 5 MG tablet TAKE 1 TABLET BY MOUTH AT BEDTIME 90 tablet 0   UNABLE TO FIND 2 tablets daily. Med Name: [employer]ogn[employer] by [facility]     valsartan (DIOVAN) 160 MG tablet Take 1 tablet (160 mg total) by mouth daily. 90 tablet 1   No facility-administered medications prior to visit.    PAST MEDICAL HISTORY: Past Medical History:  Diagnosis Date   Brachial neuritis or radiculitis NOS    Cervicalgia    Degeneration of cervical intervertebral disc    Degeneration of lumbar or lumbosacral intervertebral disc    Dysfunction of eustachian  tube    Osteoporosis 2017   Spinal stenosis in cervical region    Unspecified essential hypertension     PAST SURGICAL HISTORY: Past Surgical History:  Procedure Laterality Date   CERVICAL DISCECTOMY  07/2009   C3-4 with fusion- Dr PJanine Ores  COLONOSCOPY     polypectomy   NDowning 01/2009   GCarbonENT   SPINE SURGERY     TUBAL LIGATION      FAMILY HISTORY: Family History  Problem Relation Age of Onset   Hypertension Mother    Heart disease Mother        CAD MI CABG   Cancer Mother    Heart disease Father        CAD MI   Hypertension Father    Dementia Brother    Heart disease Brother        CAD   Esophageal cancer Brother    COPD Brother    Cancer Maternal Aunt        Breast   Diabetes Neg Hx    Colon cancer Neg Hx    Stomach cancer Neg Hx     SOCIAL HISTORY: Social History   Socioeconomic History   Marital status: Married    Spouse name: JClair Gulling  Number of children: 1   Years of education: Not on file   Highest education level: Some college, no degree  Occupational History  Occupation: retired  Tobacco Use   Smoking status: Former    Types: Cigarettes    Quit date: 02/16/1981    Years since quitting: 39.5   Smokeless tobacco: Never   Tobacco comments:    During Adolescence  Substance and Sexual Activity   Alcohol use: Yes    Alcohol/week: 2.0 standard drinks    Types: 2 Standard drinks or equivalent per week   Drug use: No   Sexual activity: Yes    Partners: Male  Other Topics Concern   Not on file  Social History Narrative   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 Married in 1969, one daughter-'73 and 2 grandchildren.  Work- retired from  [employer] October '12 after 43 years. Life is good.    Social Determinants of Health   Financial Resource Strain: Low Risk    Difficulty of Paying Living Expenses: Not hard at all  Food Insecurity: Not on file  Transportation Needs: Not on file  Physical Activity: Not on file  Stress: Not on file  Social  Connections: Not on file  Intimate Partner Violence: Not on file     PHYSICAL EXAM  GENERAL EXAM/CONSTITUTIONAL: Vitals:  Vitals:   08/18/20 1426  BP: (!) 155/88  Pulse: 75  Weight: 160 lb (72.6 kg)  Height: '5\' 5"'  (1.651 m)   Body mass index is 26.63 kg/m. Wt Readings from Last 3 Encounters:  08/18/20 160 lb (72.6 kg)  07/05/20 164 lb (74.4 kg)  06/16/20 163 lb 3.2 oz (74 kg)   Patient is in no distress; well developed, nourished and groomed; neck is supple  CARDIOVASCULAR: Examination of carotid arteries is normal; no carotid bruits Regular rate and rhythm, no murmurs Examination of peripheral vascular system by observation and palpation is normal  EYES: Ophthalmoscopic exam of optic discs and posterior segments is normal; no papilledema or hemorrhages No results found.  MUSCULOSKELETAL: Gait, strength, tone, movements noted in Neurologic exam below  NEUROLOGIC: MENTAL STATUS:  MMSE - Mini Mental State Exam 07/05/2020  Orientation to time 3  Orientation to Place 4  Registration 3  Attention/ Calculation 1  Recall 0  Language- name 2 objects 2  Language- repeat 0  Language- follow 3 step command 3  Language- read & follow direction 1  Write a sentence 1  Copy design 1  Total score 19   awake, alert, oriented to person, place and time recent and remote memory intact normal attention and concentration language fluent, comprehension intact, naming intact fund of knowledge appropriate  CRANIAL NERVE:  2nd - no papilledema on fundoscopic exam 2nd, 3rd, 4th, 6th - pupils equal and reactive to light, visual fields full to confrontation, extraocular muscles intact, no nystagmus 5th - facial sensation symmetric 7th - facial strength symmetric 8th - hearing intact 9th - palate elevates symmetrically, uvula midline 11th - shoulder shrug symmetric 12th - tongue protrusion midline  MOTOR:  normal bulk and tone, full strength in the BUE, BLE  SENSORY:  normal  and symmetric to light touch, temperature, vibration  COORDINATION:  finger-nose-finger, fine finger movements normal  REFLEXES:  deep tendon reflexes TRACE and symmetric  GAIT/STATION:  narrow based gait     DIAGNOSTIC DATA (LABS, IMAGING, TESTING) - I reviewed patient records, labs, notes, testing and imaging myself where available.  Lab Results  Component Value Date   WBC 7.2 06/16/2020   HGB 14.2 06/16/2020   HCT 41.8 06/16/2020   MCV 89.4 06/16/2020   PLT 186.0 06/16/2020      Component  Value Date/Time   NA 138 07/03/2019 1120   K 4.0 07/03/2019 1120   CL 103 07/03/2019 1120   CO2 28 07/03/2019 1120   GLUCOSE 100 (H) 07/03/2019 1120   BUN 12 07/03/2019 1120   CREATININE 0.84 07/03/2019 1120   CALCIUM 9.4 07/03/2019 1120   PROT 7.2 06/16/2020 1122   ALBUMIN 4.5 06/16/2020 1122   AST 19 06/16/2020 1122   ALT 14 06/16/2020 1122   ALKPHOS 57 06/16/2020 1122   BILITOT 0.7 06/16/2020 1122   GFRNONAA >60 08/04/2009 0852   GFRAA  08/04/2009 0852    >60        The eGFR has been calculated using the MDRD equation. This calculation has not been validated in all clinical situations. eGFR's persistently <60 mL/min signify possible Chronic Kidney Disease.   Lab Results  Component Value Date   CHOL 183 06/16/2020   HDL 66.10 06/16/2020   LDLCALC 99 06/16/2020   LDLDIRECT 148.3 04/13/2011   TRIG 89.0 06/16/2020   CHOLHDL 3 06/16/2020   Lab Results  Component Value Date   HGBA1C 5.0 08/10/2015   Lab Results  Component Value Date   BTYOMAYO45 997 06/16/2020   Lab Results  Component Value Date   TSH 0.97 06/16/2020    07/16/20 MRI brain [I reviewed images myself and agree with interpretation. -VRP]  1. No acute intracranial abnormality. 2. Moderate for age signal changes in the cerebral white matter, with mild involvement of the thalami. This is most commonly due to chronic small vessel disease.    ASSESSMENT AND PLAN  75 y.o. 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  Proceed with further work-up and brain healthy activities.  Dx:  1. Memory loss     PLAN:  MEMORY LOSS (MMSE 19/30; decreased insight; slight change in more complex ADLs) - consider memantine - safety / supervision issues reviewed - daily physical activity / exercise (at least 15-30 minutes) - eat more plants / vegetables - increase social activities, brain stimulation, games, puzzles, hobbies, crafts, arts, music - aim for at least 7-8 hours sleep per night (or more) - avoid smoking and alcohol - caregiver resources provided - caution with medications, finances, driving  Return for return to PCP, pending if symptoms worsen or fail to improve.    Penni Bombard, MD 7/41/4239, 5:32 PM Certified in Neurology, Neurophysiology and Neuroimaging  [facility] [address], [place], [place] [address] [phone]

## 2020-08-18 NOTE — Patient Instructions (Addendum)
  MEMORY LOSS (MMSE 19/30; decreased insight; slight change in more complex ADLs) - consider memantine - safety / supervision issues reviewed - daily physical activity / exercise (at least 15-30 minutes) - eat more plants / vegetables - increase social activities, brain stimulation, games, puzzles, hobbies, crafts, arts, music - aim for at least 7-8 hours sleep per night (or more) - avoid smoking and alcohol - caregiver resources provided - caution with medications, finances, driving

## 2020-08-21 ENCOUNTER — Other Ambulatory Visit: Payer: Self-pay | Admitting: Internal Medicine

## 2020-08-21 DIAGNOSIS — I10 Essential (primary) hypertension: Secondary | ICD-10-CM

## 2020-08-25 DIAGNOSIS — Z1231 Encounter for screening mammogram for malignant neoplasm of breast: Secondary | ICD-10-CM | POA: Diagnosis not present

## 2020-08-25 LAB — HM MAMMOGRAPHY

## 2020-08-30 DIAGNOSIS — Z23 Encounter for immunization: Secondary | ICD-10-CM | POA: Diagnosis not present

## 2020-09-29 ENCOUNTER — Ambulatory Visit (INDEPENDENT_AMBULATORY_CARE_PROVIDER_SITE_OTHER): Payer: Medicare Other

## 2020-09-29 ENCOUNTER — Other Ambulatory Visit: Payer: Self-pay

## 2020-09-29 DIAGNOSIS — I10 Essential (primary) hypertension: Secondary | ICD-10-CM

## 2020-09-29 DIAGNOSIS — L570 Actinic keratosis: Secondary | ICD-10-CM | POA: Diagnosis not present

## 2020-09-29 DIAGNOSIS — M810 Age-related osteoporosis without current pathological fracture: Secondary | ICD-10-CM

## 2020-09-29 DIAGNOSIS — D225 Melanocytic nevi of trunk: Secondary | ICD-10-CM | POA: Diagnosis not present

## 2020-09-29 DIAGNOSIS — D2271 Melanocytic nevi of right lower limb, including hip: Secondary | ICD-10-CM | POA: Diagnosis not present

## 2020-09-29 DIAGNOSIS — K2100 Gastro-esophageal reflux disease with esophagitis, without bleeding: Secondary | ICD-10-CM

## 2020-09-29 DIAGNOSIS — E785 Hyperlipidemia, unspecified: Secondary | ICD-10-CM

## 2020-09-29 DIAGNOSIS — L821 Other seborrheic keratosis: Secondary | ICD-10-CM | POA: Diagnosis not present

## 2020-09-29 DIAGNOSIS — D1801 Hemangioma of skin and subcutaneous tissue: Secondary | ICD-10-CM | POA: Diagnosis not present

## 2020-09-29 NOTE — Patient Instructions (Signed)
Vanessa Fuentes,  It was great to talk to you today!  Please call me with any questions or concerns.  Visit Information  PATIENT GOALS:  Goals Addressed             This Visit's Progress    Manage My Medicine   Not on track    Timeframe:  Long-Range Goal Priority:  High Start Date:   08/02/2020                         Expected End Date:   02/02/2021                  Follow Up Date 09/30/2020   - call for medicine refill 2 or 3 days before it runs out - keep a list of all the medicines I take; vitamins and herbals too - learn to read medicine labels - use a pillbox to sort medicine    Why is this important?   These steps will help you keep on track with your medicines.      Track and Manage My Blood Pressure-Hypertension   Not on track    Timeframe:  Long-Range Goal Priority:  High Start Date: 08/02/2020                            Expected End Date:  09/30/2020                     Follow Up Date 02/02/2021   - check blood pressure weekly - choose a place to take my blood pressure (home, clinic or office, retail store) - write blood pressure results in a log or diary    Why is this important?   You won't feel high blood pressure, but it can still hurt your blood vessels.  High blood pressure can cause heart or kidney problems. It can also cause a stroke.  Making lifestyle changes like losing a little weight or eating less salt will help.  Checking your blood pressure at home and at different times of the day can help to control blood pressure.  If the doctor prescribes medicine remember to take it the way the doctor ordered.  Call the office if you cannot afford the medicine or if there are questions about it.            Patient verbalizes understanding of instructions provided today and agrees to view in [place].   Telephone follow up appointment with care management team member scheduled for: 3 months The patient has been provided with contact information for  the care management team and has been advised to call with any health related questions or concerns.   Tomasa Blase, PharmD Clinical Pharmacist, [place]

## 2020-09-29 NOTE — Progress Notes (Signed)
Chronic Care Management Pharmacy Note  09/29/2020 Name:  Vanessa Fuentes MRN:  [id number] DOB:  Feb 13, 1945  Summary: - Patient reports that she is doing well,, feels that she is in good health, reports that since most recent neurology visit, does not feel she is having any issues in regards to her memory, not interested in starting memantine (noted as possible medication to trial in neurology note) at this time -Patient reports that she has not started monitoring blood pressure at this time, most recent readings in neurology office slightly elevated, possibly due to anxiousness of being in office -Reports that she has not been taking alendronate, appears previously had been prescribed by "[facility]" - patient reports that she has not been following with a womens health doctor, does continue daily vitamin D supplement  Recommendations/Changes made from today's visit: - Patient to start monitoring blood pressure at least once weekly - patient to reach out should BP average >140/90 or if she has any issues with hypotension  - Continue exercise and dietary patterns to help keep blood pressure and LDL under control  -will reach out to PCP about continuing on alendronate therapy - appears that medication was started in 11/2017 -plan to continue for a total of 5 years   Subjective: Vanessa Fuentes is an 75 y.o. year old female who is a primary patient of Janith Lima, MD.  The CCM team was consulted for assistance with disease management and care coordination needs.    Engaged with patient by telephone for follow up visit in response to provider referral for pharmacy case management and/or care coordination services.   Consent to Services:  The patient was given the following information about Chronic Care Management services today, agreed to services, and gave verbal consent: 1. CCM service includes personalized support from designated clinical staff supervised by the  primary care provider, including individualized plan of care and coordination with other care providers 2. 24/7 contact phone numbers for assistance for urgent and routine care needs. 3. Service will only be billed when office clinical staff spend 20 minutes or more in a month to coordinate care. 4. Only one practitioner may furnish and bill the service in a calendar month. 5.The patient may stop CCM services at any time (effective at the end of the month) by phone call to the office staff. 6. The patient will be responsible for cost sharing (co-pay) of up to 20% of the service fee (after annual deductible is met). Patient agreed to services and consent obtained.  Patient Care Team: Janith Lima, MD as PCP - General (Internal Medicine) Tomasa Blase, [facility] as Pharmacist (Pharmacist)  Recent office visits: No changes since last visit   Recent consult visits: 08/18/2020 - Neurology - MRI reviewed, no acute intracranial abnormality, moderate for age signal changes in the cerebral white matter with mild involvement of the thalami, most commonly due to chronic small vessel disease - consider memantine - follow up with PCP  Hospital visits: None in previous 6 months  Objective:  Lab Results  Component Value Date   CREATININE 0.84 07/03/2019   BUN 12 07/03/2019   GFR 66.38 07/03/2019   GFRNONAA >60 08/04/2009   GFRAA  08/04/2009    >60        The eGFR has been calculated using the MDRD equation. This calculation has not been validated in all clinical situations. eGFR's persistently <60 mL/min signify possible Chronic Kidney Disease.   NA 138 07/03/2019  K 4.0 07/03/2019   CALCIUM 9.4 07/03/2019   CO2 28 07/03/2019   GLUCOSE 100 (H) 07/03/2019    Lab Results  Component Value Date/Time   HGBA1C 5.0 08/10/2015 01:35 PM   HGBA1C 5.1 04/23/2013 11:29 AM   GFR 66.38 07/03/2019 11:20 AM   GFR 71.37 11/27/2018 04:07 PM    Last diabetic Eye exam:  No results found for:  HMDIABEYEEXA  Last diabetic Foot exam:  No results found for: HMDIABFOOTEX   Lab Results  Component Value Date   CHOL 183 06/16/2020   HDL 66.10 06/16/2020   LDLCALC 99 06/16/2020   LDLDIRECT 148.3 04/13/2011   TRIG 89.0 06/16/2020   CHOLHDL 3 06/16/2020    Hepatic Function Latest Ref Rng & Units 06/16/2020 11/27/2018 11/27/2017  Total Protein 6.0 - 8.3 g/dL 7.2 6.6 6.7  Albumin 3.5 - 5.2 g/dL 4.5 4.3 4.3  AST 0 - 37 U/L '19 14 17  ' ALT 0 - 35 U/L '14 12 18  ' Alk Phosphatase 39 - 117 U/L 57 49 46  Total Bilirubin 0.2 - 1.2 mg/dL 0.7 0.4 0.6  Bilirubin, Direct 0.0 - 0.3 mg/dL 0.1 0.1 -    Lab Results  Component Value Date/Time   TSH 0.97 06/16/2020 11:22 AM   TSH 0.85 07/03/2019 11:20 AM    CBC Latest Ref Rng & Units 06/16/2020 07/03/2019 11/27/2018  WBC 4.0 - 10.5 K/uL 7.2 6.2 6.6  Hemoglobin 12.0 - 15.0 g/dL 14.2 14.2 13.6  Hematocrit 36.0 - 46.0 % 41.8 41.7 40.2  Platelets 150.0 - 400.0 K/uL 186.0 172.0 176.0    Lab Results  Component Value Date/Time   VD25OH 34.15 08/10/2015 01:35 PM    Clinical ASCVD: No  The 10-year ASCVD risk score Mikey Bussing DC Jr., et al., 2013) is: 26.3%   Values used to calculate the score:     Age: 59 years     Sex: Female     Is Non-Hispanic African American: No     Diabetic: No     Tobacco smoker: No     Systolic Blood Pressure: 497 mmHg     Is BP treated: Yes     HDL Cholesterol: 66.1 mg/dL     Total Cholesterol: 183 mg/dL    Depression screen [facility] 2/9 07/31/2019 11/27/2018 11/28/2017  Decreased Interest 0 0 0  Down, Depressed, Hopeless 0 0 0  PHQ - 2 Score 0 0 0  Altered sleeping - - -  Tired, decreased energy - - -  Change in appetite - - -  Feeling bad or failure about yourself  - - -  Trouble concentrating - - -  Moving slowly or fidgety/restless - - -  Suicidal thoughts - - -  PHQ-9 Score - - -     Social History   Tobacco Use  Smoking Status Former   Types: Cigarettes   Quit date: 02/16/1981   Years since quitting: 39.6   Smokeless Tobacco Never  Tobacco Comments   During Adolescence   BP Readings from Last 3 Encounters:  08/18/20 (!) 155/88  07/05/20 (!) 154/86  06/16/20 130/82   Pulse Readings from Last 3 Encounters:  08/18/20 75  07/05/20 71  06/16/20 71   Wt Readings from Last 3 Encounters:  08/18/20 160 lb (72.6 kg)  07/05/20 164 lb (74.4 kg)  06/16/20 163 lb 3.2 oz (74 kg)   BMI Readings from Last 3 Encounters:  08/18/20 26.63 kg/m  07/05/20 26.47 kg/m  06/16/20 28.91 kg/m    Assessment/Interventions: Review  of patient past medical history, allergies, medications, health status, including review of consultants reports, laboratory and other test data, was performed as part of comprehensive evaluation and provision of chronic care management services.   SDOH:  (Social Determinants of Health) assessments and interventions performed: Yes  SDOH Screenings   Alcohol Screen: Not on file  Depression (PHQ2-9): Not on file  Financial Resource Strain: Low Risk    Difficulty of Paying Living Expenses: Not hard at all  Food Insecurity: Not on file  Housing: Not on file  Physical Activity: Not on file  Social Connections: Not on file  Stress: Not on file  Tobacco Use: Medium Risk   Smoking Tobacco Use: Former   Smokeless Tobacco Use: Never  Transportation Needs: Not on file    [place]  Allergies  Allergen Reactions   Sulfonamide Derivatives     Blisters eyes    Medications Reviewed Today     Reviewed by Kary Kos, CMA (Certified Medical Assistant) on 08/18/20 at [place] List Status: <None>   Medication Order Taking? Sig Documenting Provider Last Dose Status Informant  alendronate (FOSAMAX) 70 MG tablet [id number]  alendronate 70 mg tablet  TK 1 T PO Q WK  Patient not taking: No sig reported   [provider]  Active   Cholecalciferol (VITAMIN D-3 PO) [id number]  Take 1,000 Units by mouth daily. [provider]  Active   omeprazole  (PRILOSEC) 40 MG capsule [id number]  Take 1 capsule (40 mg total) by mouth daily. Janith Lima, MD  Active   rosuvastatin (CRESTOR) 5 MG tablet [id number]  TAKE 1 TABLET BY MOUTH AT BEDTIME Janith Lima, MD  Active   UNABLE TO FIND [id number]  2 tablets daily. Med Name: [employer] by primal labs [provider]  Active   valsartan (DIOVAN) 160 MG tablet [id number]  Take 1 tablet (160 mg total) by mouth daily. Janith Lima, MD  Active             Patient Active Problem List   Diagnosis Date Noted   Memory difficulties 06/16/2020   Lumbar radiculopathy 06/13/2018   Chronic midline low back pain 10/17/2016   Gastroesophageal reflux disease with esophagitis 03/21/2016   IBS (irritable bowel syndrome) 07/20/2014   Osteoporosis 04/24/2013   Hyperlipidemia with target LDL less than 130 04/14/2011   Routine general medical examination at a health care facility 08/04/2010   Essential hypertension 10/13/2008   DEGENERATIVE [place], LUMBAR SPINE 10/13/2008    Immunization History  Administered Date(s) Administered   Fluad Quad(high Dose 65+) 11/12/2018   Influenza Split 11/13/2013   Influenza Whole 12/22/2009, 10/17/2011   Influenza, High Dose Seasonal PF 09/06/2012, 09/13/2014, 10/17/2016, 11/27/2017, 10/24/2018   Influenza-Unspecified 09/13/2014   Moderna Sars-Covid-2 Vaccination 03/03/2019, 03/31/2019   Pneumococcal Conjugate-13 11/13/2013   Pneumococcal Polysaccharide-23 09/06/2012   Td 12/23/1996   Tdap 04/23/2013   Zoster, Live 12/22/2009    Conditions to be addressed/monitored:  Hypertension, Hyperlipidemia, GERD, and Osteoporosis  Care Plan : CCM Care Plan  Updates made by Tomasa Blase, [place] since 09/29/2020 12:00 AM     Problem: HTN, HLD, Osteoporosis, GERD   Priority: High  Onset Date: 08/02/2020     Long-Range Goal: Disease Management   Start Date: 08/02/2020  Expected End Date: 02/02/2021  This Visit's Progress: Not on track  Recent Progress:  On track  Priority: High  Note:   Current Barriers:  Unable to independently monitor therapeutic efficacy  Pharmacist  Clinical Goal(s):  Patient will achieve adherence to monitoring guidelines and medication adherence to achieve therapeutic efficacy maintain control of BP and LDL as evidenced by blood pressure logs and next lipid panel results   adhere to prescribed medication regimen as evidenced by taking medications as directed by using weekly pill box through collaboration with PharmD and provider.   Interventions: 1:1 collaboration with Janith Lima, MD regarding development and update of comprehensive plan of care as evidenced by provider attestation and co-signature Inter-disciplinary care team collaboration (see longitudinal plan of care) Comprehensive medication review performed; medication list updated in electronic medical record  Hypertension (BP goal <140/90) -Controlled  -Current treatment: Valsartan 164m daily  -Medications previously tried: furosemide, indapamide  -Most recent office visit readings: 154/86 (first appointment with neruology), 130/82, and 118/70 - patient not currently checking at home but son reports they do have a BP cuff  -Current dietary habits: eats a low sodium diet  -Current exercise habits: walking typically 4 days a week - for 15-20 minutes at a time  -Denies hypotensive/hypertensive symptoms -Educated on BP goals and benefits of medications for prevention of heart attack, stroke and kidney damage; Daily salt intake goal < 2300 mg; Importance of home blood pressure monitoring; Proper BP monitoring technique; Symptoms of hypotension and importance of maintaining adequate hydration; -Counseled to monitor BP at home 1-2 times weekly, document, and provide log at future appointments -Counseled on diet and exercise extensively Recommended to continue current medication  Hyperlipidemia: (LDL goal < 130) -Controlled -Last LDL 99 mg/dL  (06/16/2020) -Current treatment: Rosuvastatin 583mdaily -Medications previously tried: n/a  -Current dietary patterns: reports to a diet that is low in fried/ fatty foods / red meats  -Current exercise habits: walking 4 times weekly for about 15-20 minutes at a time  -Educated on Cholesterol goals;  Benefits of statin for ASCVD risk reduction; Importance of limiting foods high in cholesterol; Exercise goal of 150 minutes per week; -Counseled on diet and exercise extensively Recommended to continue current medication  Osteoporosis / Osteopenia (Goal prevention of fractures / disease progression) -Uncontrolled -Last DEXA Scan: 04/25/2019   T-Score right femoral neck: -2.8  T-Score left femoral neck: -2.1  T-Score right total femur: -2.1  T-Score left total femur: -1.7   T-Score lumbar spine: -0.9 -Patient is a candidate for pharmacologic treatment due to T-Score < -2.5 in femoral neck -Current treatment  Alendronate 7069m 1 tablet weekly - reports that she is not currently taking  Vitamin D3 1000units - 1 capsule daily  -Medications previously tried: n/a   -Recommend [id number] units of vitamin D daily. Counseled on oral bisphosphonate administration: take in the morning, 30 minutes prior to food with 6-8 oz of water. Do not lie down for at least 30 minutes after taking. -Recommended to continue current medication - patient and son will confirm that patient is taking alendronate - holding on calcium supplementation due to previous hypercalcemia  GERD (Goal: Acid control / prevention of symptoms) -Controlled -Current treatment  Omeprazole 50m87mily  -Medications previously tried: n/a  -Counseled on diet and exercise extensively Recommended to continue current medication  Health Maintenance -Vaccine gaps: Shingles Vaccine and COVID booster  -Current therapy:  Neuriva - 1 tablet daily  -Educated on Herbal supplement research is limited and benefits usually cannot be proven Cost vs  benefit of each product must be carefully weighed by individual consumer Supplements may interfere with prescription drugs -Patient is satisfied with current therapy and denies issues -Recommended to continue  current medication  Patient Goals/Self-Care Activities Patient will:  - take medications as prescribed focus on medication adherence by taking medications as prescribed check blood pressure 1-2 times weekly, document, and provide at future appointments  Follow Up Plan: Telephone follow up appointment with care management team member scheduled for: 3 months  The patient has been provided with contact information for the care management team and has been advised to call with any health related questions or concerns.     Medication Assistance: None required.  Patient affirms current coverage meets needs.  Compliance/Adherence/Medication fill history: Care Gaps: Colonoscopy   Patient's preferred pharmacy is:  [facility] - [place], [place] - [address] [place] [address] Phone: [phone] Fax: [phone]   Uses pill box? Yes Pt endorses 90-100% compliance  Care Plan and Follow Up Patient Decision:  Patient agrees to Care Plan and Follow-up.  Plan: Telephone follow up appointment with care management team member scheduled for:  3 months  and The patient has been provided with contact information for the care management team and has been advised to call with any health related questions or concerns.   Tomasa Blase, PharmD Clinical Pharmacist, [place]

## 2020-10-04 ENCOUNTER — Other Ambulatory Visit: Payer: Self-pay | Admitting: Internal Medicine

## 2020-10-04 DIAGNOSIS — M810 Age-related osteoporosis without current pathological fracture: Secondary | ICD-10-CM

## 2020-10-04 MED ORDER — ALENDRONATE SODIUM 70 MG PO TABS: 70.0000 mg | ORAL_TABLET | ORAL | 0 refills | Status: DC

## 2020-10-22 DIAGNOSIS — I10 Essential (primary) hypertension: Secondary | ICD-10-CM | POA: Diagnosis not present

## 2020-10-22 DIAGNOSIS — E785 Hyperlipidemia, unspecified: Secondary | ICD-10-CM

## 2020-10-22 DIAGNOSIS — M810 Age-related osteoporosis without current pathological fracture: Secondary | ICD-10-CM | POA: Diagnosis not present

## 2020-10-27 DIAGNOSIS — Z23 Encounter for immunization: Secondary | ICD-10-CM | POA: Diagnosis not present

## 2020-11-01 DIAGNOSIS — Z23 Encounter for immunization: Secondary | ICD-10-CM | POA: Diagnosis not present

## 2020-11-03 ENCOUNTER — Ambulatory Visit (INDEPENDENT_AMBULATORY_CARE_PROVIDER_SITE_OTHER): Payer: Medicare Other

## 2020-11-03 ENCOUNTER — Other Ambulatory Visit: Payer: Self-pay

## 2020-11-03 VITALS — BP 130/80 | HR 75 | Temp 98.0°F | Ht 65.0 in | Wt 164.4 lb

## 2020-11-03 DIAGNOSIS — Z0000 Encounter for general adult medical examination without abnormal findings: Secondary | ICD-10-CM | POA: Diagnosis not present

## 2020-11-03 NOTE — Patient Instructions (Signed)
Vanessa Fuentes , Thank you for taking time to come for your Medicare Wellness Visit. I appreciate your ongoing commitment to your health goals. Please review the following plan we discussed and let me know if I can assist you in the future.   Screening recommendations/referrals: Colonoscopy: 02/04/2015; due every 5 years (Overdue) Mammogram: 08/25/2020; due every 1-2 years Bone Density: 04/25/2019; due every 2 years Recommended yearly ophthalmology/optometry visit for glaucoma screening and checkup Recommended yearly dental visit for hygiene and checkup  Vaccinations: Influenza vaccine: due Fall 2022 Pneumococcal vaccine: 09/06/2012, 11/13/2013 Tdap vaccine: 04/23/2013; due every 10 years Shingles vaccine: never done   Covid-19: 03/03/2019, 03/31/2019  Advanced directives: Please bring a copy of your health care power of attorney and living will to the office at your convenience.  Conditions/risks identified: Yes; Client understands the importance of follow-up with providers by attending scheduled visits and discussed goals to eat healthier, increase physical activity, exercise the brain, socialize more, get enough sleep and make time for laughter.  Next appointment: Please schedule your next Medicare Wellness Visit with your Nurse Health Advisor in 1 year by calling [phone].   Preventive Care 29 Years and Older, Female Preventive care refers to lifestyle choices and visits with your health care provider that can promote health and wellness. What does preventive care include? A yearly physical exam. This is also called an annual well check. Dental exams once or twice a year. Routine eye exams. Ask your health care provider how often you should have your eyes checked. Personal lifestyle choices, including: Daily care of your teeth and gums. Regular physical activity. Eating a healthy diet. Avoiding tobacco and drug use. Limiting alcohol use. Practicing safe sex. Taking low-dose aspirin every  day. Taking vitamin and mineral supplements as recommended by your health care provider. What happens during an annual well check? The services and screenings done by your health care provider during your annual well check will depend on your age, overall health, lifestyle risk factors, and family history of disease. Counseling  Your health care provider may ask you questions about your: Alcohol use. Tobacco use. Drug use. Emotional well-being. Home and relationship well-being. Sexual activity. Eating habits. History of falls. Memory and ability to understand (cognition). Work and work Statistician. Reproductive health. Screening  You may have the following tests or measurements: Height, weight, and BMI. Blood pressure. Lipid and cholesterol levels. These may be checked every 5 years, or more frequently if you are over 26 years old. Skin check. Lung cancer screening. You may have this screening every year starting at age 9 if you have a 30-pack-year history of smoking and currently smoke or have quit within the past 15 years. Fecal occult blood test (FOBT) of the stool. You may have this test every year starting at age 8. Flexible sigmoidoscopy or colonoscopy. You may have a sigmoidoscopy every 5 years or a colonoscopy every 10 years starting at age 65. Hepatitis C blood test. Hepatitis B blood test. Sexually transmitted disease (STD) testing. Diabetes screening. This is done by checking your blood sugar (glucose) after you have not eaten for a while (fasting). You may have this done every 1-3 years. Bone density scan. This is done to screen for osteoporosis. You may have this done starting at age 62. Mammogram. This may be done every 1-2 years. Talk to your health care provider about how often you should have regular mammograms. Talk with your health care provider about your test results, treatment options, and if necessary, the need  for more tests. Vaccines  Your health care  provider may recommend certain vaccines, such as: Influenza vaccine. This is recommended every year. Tetanus, diphtheria, and acellular pertussis (Tdap, Td) vaccine. You may need a Td booster every 10 years. Zoster vaccine. You may need this after age 7. Pneumococcal 13-valent conjugate (PCV13) vaccine. One dose is recommended after age 74. Pneumococcal polysaccharide (PPSV23) vaccine. One dose is recommended after age 12. Talk to your health care provider about which screenings and vaccines you need and how often you need them. This information is not intended to replace advice given to you by your health care provider. Make sure you discuss any questions you have with your health care provider. Document Released: 02/05/2015 Document Revised: 09/29/2015 Document Reviewed: 11/10/2014 Elsevier Interactive Patient Education  2017 [place] Prevention in the Home Falls can cause injuries. They can happen to people of all ages. There are many things you can do to make your home safe and to help prevent falls. What can I do on the outside of my home? Regularly fix the edges of walkways and driveways and fix any cracks. Remove anything that might make you trip as you walk through a door, such as a raised step or threshold. Trim any bushes or trees on the path to your home. Use bright outdoor lighting. Clear any walking paths of anything that might make someone trip, such as rocks or tools. Regularly check to see if handrails are loose or broken. Make sure that both sides of any steps have handrails. Any raised decks and porches should have guardrails on the edges. Have any leaves, snow, or ice cleared regularly. Use sand or salt on walking paths during winter. Clean up any spills in your garage right away. This includes oil or grease spills. What can I do in the bathroom? Use night lights. Install grab bars by the toilet and in the tub and shower. Do not use towel bars as grab  bars. Use non-skid mats or decals in the tub or shower. If you need to sit down in the shower, use a plastic, non-slip stool. Keep the floor dry. Clean up any water that spills on the floor as soon as it happens. Remove soap buildup in the tub or shower regularly. Attach bath mats securely with double-sided non-slip rug tape. Do not have throw rugs and other things on the floor that can make you trip. What can I do in the bedroom? Use night lights. Make sure that you have a light by your bed that is easy to reach. Do not use any sheets or blankets that are too big for your bed. They should not hang down onto the floor. Have a firm chair that has side arms. You can use this for support while you get dressed. Do not have throw rugs and other things on the floor that can make you trip. What can I do in the kitchen? Clean up any spills right away. Avoid walking on wet floors. Keep items that you use a lot in easy-to-reach places. If you need to reach something above you, use a strong step stool that has a grab bar. Keep electrical cords out of the way. Do not use floor polish or wax that makes floors slippery. If you must use wax, use non-skid floor wax. Do not have throw rugs and other things on the floor that can make you trip. What can I do with my stairs? Do not leave any items on the stairs.  Make sure that there are handrails on both sides of the stairs and use them. Fix handrails that are broken or loose. Make sure that handrails are as long as the stairways. Check any carpeting to make sure that it is firmly attached to the stairs. Fix any carpet that is loose or worn. Avoid having throw rugs at the top or bottom of the stairs. If you do have throw rugs, attach them to the floor with carpet tape. Make sure that you have a light switch at the top of the stairs and the bottom of the stairs. If you do not have them, ask someone to add them for you. What else can I do to help prevent  falls? Wear shoes that: Do not have high heels. Have rubber bottoms. Are comfortable and fit you well. Are closed at the toe. Do not wear sandals. If you use a stepladder: Make sure that it is fully opened. Do not climb a closed stepladder. Make sure that both sides of the stepladder are locked into place. Ask someone to hold it for you, if possible. Clearly mark and make sure that you can see: Any grab bars or handrails. First and last steps. Where the edge of each step is. Use tools that help you move around (mobility aids) if they are needed. These include: Canes. Walkers. Scooters. Crutches. Turn on the lights when you go into a dark area. Replace any light bulbs as soon as they burn out. Set up your furniture so you have a clear path. Avoid moving your furniture around. If any of your floors are uneven, fix them. If there are any pets around you, be aware of where they are. Review your medicines with your doctor. Some medicines can make you feel dizzy. This can increase your chance of falling. Ask your doctor what other things that you can do to help prevent falls. This information is not intended to replace advice given to you by your health care provider. Make sure you discuss any questions you have with your health care provider. Document Released: 11/05/2008 Document Revised: 06/17/2015 Document Reviewed: 02/13/2014 Elsevier Interactive Patient Education  2017 [employer].

## 2020-11-03 NOTE — Progress Notes (Signed)
Subjective:   Vanessa Fuentes is a 75 y.o. female who presents for Medicare Annual (Subsequent) preventive examination.  Review of Systems     Cardiac Risk Factors include: advanced age (>25men, >11 women);dyslipidemia;hypertension;family history of premature cardiovascular disease     Objective:    Today's Vitals   11/03/20 0842  BP: 130/80  Pulse: 75  Temp: 98 F (36.7 C)  SpO2: 97%  Weight: 164 lb 6.4 oz (74.6 kg)  Height: 5\' 5"  (1.651 m)  PainSc: 0-No pain   Body mass index is 27.36 kg/m.  Advanced Directives 11/03/2020 07/31/2019 12/26/2016 11/24/2016 10/18/2016 08/11/2015 02/04/2015  Does Patient Have a Medical Advance Directive? Yes Yes Yes Yes Yes Yes Yes  Type of Advance Directive Living will;Healthcare Power of [place];Living will [place];Living will - [place];Living will [place];Living will Advance instruction for mental health treatment;Living will;Healthcare Power of Attorney  Does patient want to make changes to medical advance directive? No - Patient declined No - Patient declined - - - No - Patient declined -  Copy of [place] in Chart? No - copy requested No - copy requested No - copy requested - No - copy requested Yes No - copy requested  Would patient like information on creating a medical advance directive? No - Patient declined - No - Patient declined - - - -    Current Medications (verified) Outpatient Encounter Medications as of 11/03/2020  Medication Sig   alendronate (FOSAMAX) 70 MG tablet Take 1 tablet (70 mg total) by mouth once a week. Take with a full glass of water on an empty stomach.   Cholecalciferol (VITAMIN D-3 PO) Take 1,000 Units by mouth daily.   omeprazole (PRILOSEC) 40 MG capsule Take 1 capsule (40 mg total) by mouth daily.   valsartan (DIOVAN) 160 MG tablet TAKE 1 TABLET BY MOUTH EVERY DAY   Misc Natural Products (NEURIVA PO)  Take 1 tablet by mouth daily. (Patient not taking: Reported on 11/03/2020)   rosuvastatin (CRESTOR) 5 MG tablet TAKE 1 TABLET BY MOUTH AT BEDTIME (Patient not taking: Reported on 11/03/2020)   No facility-administered encounter medications on file as of 11/03/2020.    Allergies (verified) Sulfonamide derivatives   History: Past Medical History:  Diagnosis Date   Brachial neuritis or radiculitis NOS    Cervicalgia    Degeneration of cervical intervertebral disc    Degeneration of lumbar or lumbosacral intervertebral disc    Dysfunction of eustachian tube    Osteoporosis 2017   Spinal stenosis in cervical region    Unspecified essential hypertension    Past Surgical History:  Procedure Laterality Date   CERVICAL DISCECTOMY  07/2009   C3-4 with fusion- Dr Janine Ores   COLONOSCOPY     polypectomy   NASAL SEPTUM SURGERY  01/2009   [facility]   SPINE SURGERY     TUBAL LIGATION     Family History  Problem Relation Age of Onset   Hypertension Mother    Heart disease Mother        CAD MI CABG   Cancer Mother    Heart disease Father        CAD MI   Hypertension Father    Dementia Brother    Heart disease Brother        CAD   Esophageal cancer Brother    COPD Brother    Cancer Maternal Aunt  Breast   Diabetes Neg Hx    Colon cancer Neg Hx    Stomach cancer Neg Hx    Social History   Socioeconomic History   Marital status: Married    Spouse name: Clair Gulling   Number of children: 1   Years of education: Not on file   Highest education level: Some college, no degree  Occupational History   Occupation: retired  Tobacco Use   Smoking status: Former    Types: Cigarettes    Quit date: 02/16/1981    Years since quitting: 39.7   Smokeless tobacco: Never   Tobacco comments:    During Adolescence  Substance and Sexual Activity   Alcohol use: Yes    Alcohol/week: 2.0 standard drinks    Types: 2 Standard drinks or equivalent per week   Drug use: No   Sexual activity: Yes     Partners: Male  Other Topics Concern   Not on file  Social History Narrative   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 Married in 1969, one daughter-'73 and 2 grandchildren.  Work- retired from  [employer] October '12 after 43 years. Life is good.    Social Determinants of Health   Financial Resource Strain: Low Risk    Difficulty of Paying Living Expenses: Not hard at all  Food Insecurity: No Food Insecurity   Worried About Charity fundraiser in the Last Year: Never true   [place] in the Last Year: Never true  Transportation Needs: No Transportation Needs   Lack of Transportation (Medical): No   Lack of Transportation (Non-Medical): No  Physical Activity: Sufficiently Active   Days of Exercise per Week: 5 days   Minutes of Exercise per Session: 30 min  Stress: No Stress Concern Present   Feeling of Stress : Not at all  Social Connections: Socially Integrated   Frequency of Communication with Friends and Family: More than three times a week   Frequency of Social Gatherings with Friends and Family: More than three times a week   Attends Religious Services: More than 4 times per year   Active Member of [employer] or Organizations: Yes   Attends Music therapist: More than 4 times per year   Marital Status: Married    Tobacco Counseling Counseling given: Not Answered Tobacco comments: During Adolescence   Clinical Intake:  Pre-visit preparation completed: Yes  Pain : No/denies pain Pain Score: 0-No pain     BMI - recorded: 27.36 Nutritional Status: BMI 25 -29 Overweight Nutritional Risks: None Diabetes: No  How often do you need to have someone help you when you read instructions, pamphlets, or other written materials from your doctor or pharmacy?: 1 - Never What is the last grade level you completed in school?: 2.5 years of college  Diabetic? no  Interpreter Needed?: No  Information entered by :: Lisette Abu, LPN   Activities of Daily Living In your  present state of health, do you have any difficulty performing the following activities: 11/03/2020  Hearing? N  Vision? N  Difficulty concentrating or making decisions? Y  Walking or climbing stairs? N  Dressing or bathing? N  Doing errands, shopping? N  Preparing Food and eating ? N  Using the Toilet? N  In the past six months, have you accidently leaked urine? N  Do you have problems with loss of bowel control? N  Managing your Medications? N  Managing your Finances? N  Housekeeping or managing your Housekeeping? N  Some recent data  might be hidden    Patient Care Team: Janith Lima, MD as PCP - General (Internal Medicine) Delice Bison Darnelle Maffucci, [facility] as Pharmacist (Pharmacist)  Indicate any recent Medical Services you may have received from other than [facility] providers in the past year (date may be approximate).     Assessment:   This is a routine wellness examination for [place].  Hearing/Vision screen Hearing Screening - Comments:: Patient denied any hearing difficulty.   No hearing aids.  Vision Screening - Comments:: Patient denied any vision difficulty.  Dietary issues and exercise activities discussed: Current Exercise Habits: Home exercise routine, Type of exercise: walking, Time (Minutes): 30, Frequency (Times/Week): 5, Weekly Exercise (Minutes/Week): 150, Intensity: Moderate, Exercise limited by: None identified   Goals Addressed               This Visit's Progress     Patient Stated (pt-stated)        To maintain my current health status by continuing to eat healthy, stay physically active and socially active.      Depression Screen PHQ 2/9 Scores 11/03/2020 07/31/2019 11/27/2018 11/28/2017 11/27/2017 11/24/2016 11/24/2016  PHQ - 2 Score 0 0 0 0 0 0 0  PHQ- 9 Score - - - - - 0 -    Fall Risk Fall Risk  11/03/2020 07/31/2019 12/18/2018 11/27/2018 11/28/2017  Falls in the past year? 0 0 0 0 0  Comment - - Emmi Telephone Survey: data to providers prior to load - -   Number falls in past yr: 0 0 - 0 -  Injury with Fall? 0 0 - 0 -  Risk for fall due to : No Fall Risks No Fall Risks - - -  Follow up Falls evaluation completed Falls evaluation completed - Falls evaluation completed -    FALL RISK PREVENTION PERTAINING TO THE HOME:  Any stairs in or around the home? Yes  If so, are there any without handrails? No  Home free of loose throw rugs in walkways, pet beds, electrical cords, etc? Yes  Adequate lighting in your home to reduce risk of falls? Yes   ASSISTIVE DEVICES UTILIZED TO PREVENT FALLS:  Life alert? No  Use of a cane, walker or w/c? No  Grab bars in the bathroom? Yes  Shower chair or bench in shower? Yes  Elevated toilet seat or a handicapped toilet? Yes   TIMED UP AND GO:  Was the test performed? Yes .  Length of time to ambulate 10 feet: 6 sec.   Gait steady and fast without use of assistive device  Cognitive Function: Patient has current diagnosis of cognitive impairment. Patient is followed by neurology for ongoing assessment.   MMSE - Mini Mental State Exam 07/05/2020  Orientation to time 3  Orientation to Place 4  Registration 3  Attention/ Calculation 1  Recall 0  Language- name 2 objects 2  Language- repeat 0  Language- follow 3 step command 3  Language- read & follow direction 1  Write a sentence 1  Copy design 1  Total score 19        Immunizations Immunization History  Administered Date(s) Administered   Fluad Quad(high Dose 65+) 11/12/2018   Influenza Split 11/13/2013   Influenza Whole 12/22/2009, 10/17/2011   Influenza, High Dose Seasonal PF 09/06/2012, 09/13/2014, 10/17/2016, 11/27/2017, 10/24/2018   Influenza-Unspecified 09/13/2014   Moderna Sars-Covid-2 Vaccination 03/03/2019, 03/31/2019   Pneumococcal Conjugate-13 11/13/2013   Pneumococcal Polysaccharide-23 09/06/2012   Td 12/23/1996   Tdap 04/23/2013  Zoster, Live 12/22/2009    TDAP status: Up to date  Flu Vaccine status: Due, Education  has been provided regarding the importance of this vaccine. Advised may receive this vaccine at local pharmacy or Health Dept. Aware to provide a copy of the vaccination record if obtained from local pharmacy or Health Dept. Verbalized acceptance and understanding.  Pneumococcal vaccine status: Up to date  Covid-19 vaccine status: Completed vaccines  Qualifies for Shingles Vaccine? Yes   Zostavax completed Yes   Shingrix Completed?: No.    Education has been provided regarding the importance of this vaccine. Patient has been advised to call insurance company to determine out of pocket expense if they have not yet received this vaccine. Advised may also receive vaccine at local pharmacy or Health Dept. Verbalized acceptance and understanding.  Screening Tests Health Maintenance  Topic Date Due   Zoster Vaccines- Shingrix (1 of 2) Never done   COVID-19 Vaccine (3 - Booster for Moderna series) 08/31/2019   COLONOSCOPY (Pts 45-89yrs Insurance coverage will need to be confirmed)  02/04/2020   INFLUENZA VACCINE  08/23/2020   MAMMOGRAM  08/26/2022   TETANUS/TDAP  04/24/2023   DEXA SCAN  Completed   Hepatitis C Screening  Completed   HPV VACCINES  Aged Out    Health Maintenance  Health Maintenance Due  Topic Date Due   Zoster Vaccines- Shingrix (1 of 2) Never done   COVID-19 Vaccine (3 - Booster for Moderna series) 08/31/2019   COLONOSCOPY (Pts 45-42yrs Insurance coverage will need to be confirmed)  02/04/2020   INFLUENZA VACCINE  08/23/2020    Colorectal cancer screening: No longer required.  (Patient refused)  Mammogram status: Completed 08/25/2020. Repeat every year  Bone Density status: Completed 04/25/2019. Results reflect: Bone density results: OSTEOPOROSIS. Repeat every 2-3 years.  Lung Cancer Screening: (Low Dose CT Chest recommended if Age 38-80 years, 30 pack-year currently smoking OR have quit w/in 15years.) does not qualify.   Lung Cancer Screening Referral:  no   Additional Screening:  Hepatitis C Screening: does qualify; Completed: yes  Vision Screening: Recommended annual ophthalmology exams for early detection of glaucoma and other disorders of the eye. Is the patient up to date with their annual eye exam?  Yes  Who is the provider or what is the name of the office in which the patient attends annual eye exams? [facility] If pt is not established with a provider, would they like to be referred to a provider to establish care? No .   Dental Screening: Recommended annual dental exams for proper oral hygiene  Community Resource Referral / Chronic Care Management: CRR required this visit?  No   CCM required this visit?  No      Plan:     I have personally reviewed and noted the following in the patient's chart:   Medical and social history Use of alcohol, tobacco or illicit drugs  Current medications and supplements including opioid prescriptions.  Functional ability and status Nutritional status Physical activity Advanced directives List of other physicians Hospitalizations, surgeries, and ER visits in previous 12 months Vitals Screenings to include cognitive, depression, and falls Referrals and appointments  In addition, I have reviewed and discussed with patient certain preventive protocols, quality metrics, and best practice recommendations. A written personalized care plan for preventive services as well as general preventive health recommendations were provided to patient.     Sheral Flow, LPN   [id number]   Nurse Notes:  Hearing Screening - Comments:: Patient  denied any hearing difficulty.   No hearing aids.  Vision Screening - Comments:: Patient denied any vision difficulty.

## 2020-11-05 ENCOUNTER — Telehealth: Payer: Self-pay

## 2020-11-05 NOTE — Telephone Encounter (Signed)
Patient's husband called and left message that her immunization dates were incorrect. Left voicemail to return call to office.  Lisette Abu, LPN

## 2020-11-10 ENCOUNTER — Telehealth: Payer: Self-pay | Admitting: Internal Medicine

## 2020-11-10 NOTE — Telephone Encounter (Signed)
49mo f/u scheduled for 11/28

## 2020-11-10 NOTE — Telephone Encounter (Signed)
Flu shot and booster documented.   Pt's husband has been informed that PCP would like to counsel prior to Shingles vaccine administration. They are interested in getting that done during next OV with PCP.

## 2020-11-10 NOTE — Telephone Encounter (Signed)
Patients' spouse calling to update flu and 2nd covid booster information  Spouse states he will call back w/ lot # for booster  Flu shot was received on 10-27-2020 [facility]  2nd booster received 11-02-2020 [facility]  Spouse will like call back to discuss shingles vaccination [phone]

## 2020-12-20 ENCOUNTER — Ambulatory Visit (INDEPENDENT_AMBULATORY_CARE_PROVIDER_SITE_OTHER): Payer: Medicare Other | Admitting: Internal Medicine

## 2020-12-20 ENCOUNTER — Encounter: Payer: Self-pay | Admitting: Internal Medicine

## 2020-12-20 ENCOUNTER — Other Ambulatory Visit: Payer: Self-pay

## 2020-12-20 VITALS — BP 134/86 | HR 79 | Temp 97.7°F | Ht 65.0 in | Wt 168.0 lb

## 2020-12-20 DIAGNOSIS — N1831 Chronic kidney disease, stage 3a: Secondary | ICD-10-CM

## 2020-12-20 DIAGNOSIS — R252 Cramp and spasm: Secondary | ICD-10-CM | POA: Insufficient documentation

## 2020-12-20 DIAGNOSIS — M25521 Pain in right elbow: Secondary | ICD-10-CM | POA: Diagnosis not present

## 2020-12-20 DIAGNOSIS — I10 Essential (primary) hypertension: Secondary | ICD-10-CM | POA: Diagnosis not present

## 2020-12-20 DIAGNOSIS — M25522 Pain in left elbow: Secondary | ICD-10-CM | POA: Diagnosis not present

## 2020-12-20 DIAGNOSIS — Z23 Encounter for immunization: Secondary | ICD-10-CM

## 2020-12-20 LAB — CBC WITH DIFFERENTIAL/PLATELET
Basophils Absolute: 0 10*3/uL (ref 0.0–0.1)
Basophils Relative: 0.5 % (ref 0.0–3.0)
Eosinophils Absolute: 0.1 10*3/uL (ref 0.0–0.7)
Eosinophils Relative: 0.8 % (ref 0.0–5.0)
HCT: 40.2 % (ref 36.0–46.0)
Hemoglobin: 13.5 g/dL (ref 12.0–15.0)
Lymphocytes Relative: 20.4 % (ref 12.0–46.0)
Lymphs Abs: 1.5 10*3/uL (ref 0.7–4.0)
MCHC: 33.6 g/dL (ref 30.0–36.0)
MCV: 89.1 fl (ref 78.0–100.0)
Monocytes Absolute: 0.4 10*3/uL (ref 0.1–1.0)
Monocytes Relative: 5.6 % (ref 3.0–12.0)
Neutro Abs: 5.5 10*3/uL (ref 1.4–7.7)
Neutrophils Relative %: 72.7 % (ref 43.0–77.0)
Platelets: 188 10*3/uL (ref 150.0–400.0)
RBC: 4.51 Mil/uL (ref 3.87–5.11)
RDW: 13 % (ref 11.5–15.5)
WBC: 7.6 10*3/uL (ref 4.0–10.5)

## 2020-12-20 LAB — CK: Total CK: 110 U/L (ref 7–177)

## 2020-12-20 LAB — BASIC METABOLIC PANEL
BUN: 14 mg/dL (ref 6–23)
CO2: 28 mEq/L (ref 19–32)
Calcium: 9.5 mg/dL (ref 8.4–10.5)
Chloride: 102 mEq/L (ref 96–112)
Creatinine, Ser: 1.06 mg/dL (ref 0.40–1.20)
GFR: 51.6 mL/min — ABNORMAL LOW (ref >60.00)
Glucose, Bld: 100 mg/dL — ABNORMAL HIGH (ref 70–99)
Potassium: 4.1 mEq/L (ref 3.5–5.1)
Sodium: 139 mEq/L (ref 135–145)

## 2020-12-20 LAB — MAGNESIUM: Magnesium: 1.7 mg/dL (ref 1.5–2.5)

## 2020-12-20 LAB — C-REACTIVE PROTEIN: CRP: <1 mg/dL (ref 0.5–20.0)

## 2020-12-20 MED ORDER — SHINGRIX 50 MCG/0.5ML IM SUSR: 0.5000 mL | Freq: Once | INTRAMUSCULAR | 1 refills | Status: AC

## 2020-12-20 NOTE — Patient Instructions (Signed)

## 2020-12-20 NOTE — Progress Notes (Signed)
Subjective:  Patient ID: Vanessa Fuentes, female    DOB: Apr 03, 1945  Age: 75 y.o. MRN: [id number]  CC: Hypertension  This visit occurred during the SARS-CoV-2 public health emergency.  Safety protocols were in place, including screening questions prior to the visit, additional usage of staff PPE, and extensive cleaning of exam room while observing appropriate contact time as indicated for disinfecting solutions.    HPI Vanessa Fuentes presents for f/up -   She complains of arthralgias in her elbows and muscle cramps in her legs.  She has been taking Motrin.  She denies claudication or joint swelling.  She is active and denies chest pain, shortness of breath, diaphoresis, or edema.  Outpatient Medications Prior to Visit  Medication Sig Dispense Refill   alendronate (FOSAMAX) 70 MG tablet Take 1 tablet (70 mg total) by mouth once a week. Take with a full glass of water on an empty stomach. 12 tablet 0   Cholecalciferol (VITAMIN D-3 PO) Take 1,000 Units by mouth daily.     Misc Natural Products (NEURIVA PO) Take 1 tablet by mouth daily.     omeprazole (PRILOSEC) 40 MG capsule Take 1 capsule (40 mg total) by mouth daily. 90 capsule 1   rosuvastatin (CRESTOR) 5 MG tablet TAKE 1 TABLET BY MOUTH AT BEDTIME 90 tablet 0   valsartan (DIOVAN) 160 MG tablet TAKE 1 TABLET BY MOUTH EVERY DAY 90 tablet 1   No facility-administered medications prior to visit.    ROS Review of Systems  Constitutional:  Negative for chills, diaphoresis, fatigue and fever.  HENT: Negative.    Eyes:  Negative for visual disturbance.  Respiratory:  Negative for cough, chest tightness, shortness of breath and wheezing.   Cardiovascular:  Negative for chest pain, palpitations and leg swelling.  Gastrointestinal:  Negative for abdominal pain, constipation, diarrhea, nausea and vomiting.  Endocrine: Negative.   Genitourinary: Negative.  Negative for difficulty urinating.  Musculoskeletal:  Positive for arthralgias  and myalgias. Negative for back pain.  Skin: Negative.   Neurological:  Negative for dizziness, weakness, light-headedness, numbness and headaches.  Hematological:  Negative for adenopathy. Does not bruise/bleed easily.  Psychiatric/Behavioral: Negative.     Objective:  BP 134/86 (BP Location: Left Arm, Patient Position: Sitting, Cuff Size: Large)   Pulse 79   Temp 97.7 F (36.5 C) (Oral)   Ht 5\' 5"  (1.651 m)   Wt 168 lb (76.2 kg)   SpO2 97%   BMI 27.96 kg/m   BP Readings from Last 3 Encounters:  12/20/20 134/86  11/03/20 130/80  08/18/20 (!) 155/88    Wt Readings from Last 3 Encounters:  12/20/20 168 lb (76.2 kg)  11/03/20 164 lb 6.4 oz (74.6 kg)  08/18/20 160 lb (72.6 kg)    Physical Exam Vitals reviewed.  HENT:     Nose: Nose normal.     Mouth/Throat:     Mouth: Mucous membranes are moist.  Eyes:     General: No scleral icterus.    Conjunctiva/sclera: Conjunctivae normal.  Cardiovascular:     Rate and Rhythm: Normal rate and regular rhythm.     Pulses:          Carotid pulses are 1+ on the right side and 1+ on the left side.      Radial pulses are 1+ on the right side and 1+ on the left side.       Femoral pulses are 1+ on the right side and 1+ on the left side.  Popliteal pulses are 1+ on the right side and 1+ on the left side.       Dorsalis pedis pulses are 1+ on the right side and 1+ on the left side.       Posterior tibial pulses are 1+ on the right side and 1+ on the left side.     Heart sounds: No murmur heard. Pulmonary:     Effort: Pulmonary effort is normal.     Breath sounds: No stridor. No wheezing, rhonchi or rales.  Abdominal:     General: Abdomen is flat.     Palpations: There is no mass.     Tenderness: There is no abdominal tenderness. There is no guarding.     Hernia: No hernia is present.  Musculoskeletal:        General: No swelling or tenderness. Normal range of motion.     Cervical back: Neck supple.     Right lower leg: No  edema.     Left lower leg: No edema.  Lymphadenopathy:     Cervical: No cervical adenopathy.  Skin:    General: Skin is warm and dry.  Neurological:     General: No focal deficit present.     Mental Status: She is alert.  Psychiatric:        Mood and Affect: Mood normal.        Behavior: Behavior normal.    Lab Results  Component Value Date   WBC 7.6 12/20/2020   HGB 13.5 12/20/2020   HCT 40.2 12/20/2020   PLT 188.0 12/20/2020   GLUCOSE 100 (H) 12/20/2020   CHOL 183 06/16/2020   TRIG 89.0 06/16/2020   HDL 66.10 06/16/2020   LDLDIRECT 148.3 04/13/2011   LDLCALC 99 06/16/2020   ALT 14 06/16/2020   AST 19 06/16/2020   NA 139 12/20/2020   K 4.1 12/20/2020   CL 102 12/20/2020   CREATININE 1.06 12/20/2020   BUN 14 12/20/2020   CO2 28 12/20/2020   TSH 0.97 06/16/2020   HGBA1C 5.0 08/10/2015    MR Brain Wo Contrast  Result Date: 07/19/2020 CLINICAL DATA:  75 year old female with memory loss. EXAM: MRI HEAD WITHOUT CONTRAST TECHNIQUE: Multiplanar, multiecho pulse sequences of the brain and surrounding structures were obtained without intravenous contrast. COMPARISON:  Cervical spine MRI 05/18/2009. FINDINGS: Brain: No restricted diffusion to suggest acute infarction. No midline shift, mass effect, evidence of mass lesion, ventriculomegaly, extra-axial collection or acute intracranial hemorrhage. Cervicomedullary junction and pituitary are within normal limits. No cortical encephalomalacia identified. Patchy, widely scattered, moderate for age cerebral white matter T2 and FLAIR hyperintensity is in a nonspecific pattern. No chronic cerebral blood products identified. Mild for age T2 heterogeneity in both medial thalami. The bilateral basal ganglia, brainstem, cerebellum, and mesial temporal lobe structures appear within normal limits for age. Vascular: Major intracranial vascular flow voids are preserved. Skull and upper cervical spine: Partially visible cervical spine ACDF is new since  2011. No visible upper cervical spinal stenosis. Normal bone marrow signal. Sinuses/Orbits: Postoperative changes to both globes, otherwise negative orbits. Trace paranasal sinus fluid and/or mucosal thickening including in the dependent portion of the left sphenoid sinus. This appears inconsequential. Other: Mastoids are clear. Grossly normal visible internal auditory structures. Visible scalp and face appear negative. IMPRESSION: 1. No acute intracranial abnormality. 2. Moderate for age signal changes in the cerebral white matter, with mild involvement of the thalami. This is most commonly due to chronic small vessel disease. Electronically Signed   By: Lemmie Evens  [place] Crane M.D.   On: 07/19/2020 07:18    Assessment & Plan:   Thy was seen today for hypertension.  Diagnoses and all orders for this visit:  Essential hypertension- Her blood pressure is adequately well controlled.  Her electrolytes are normal.  Her renal function has declined.  I have asked her to stop taking NSAIDs. -     Cancel: Basic metabolic panel; Future -     CBC with Differential/Platelet; Future -     Magnesium; Future -     Basic metabolic panel; Future -     Basic metabolic panel -     Magnesium -     CBC with Differential/Platelet  Arthralgia of both elbows- Her labs are negative for inflammatory arthropathies or myositis. -     C-reactive protein; Future -     CK; Future -     CK -     C-reactive protein  Cramps, muscle, general- See above. -     Magnesium; Future -     Basic metabolic panel; Future -     CK; Future -     CK -     Basic metabolic panel -     Magnesium  Need for shingles vaccine -     Zoster Vaccine Adjuvanted [facility]) injection; Inject 0.5 mLs into the muscle once for 1 dose.  Stage 3a chronic kidney disease ([place])- Her BP is adequately well controlled.  I have asked her to stop taking Motrin.  Other orders -     Pneumococcal polysaccharide vaccine 23-valent greater than or equal to 2yo  subcutaneous/IM  I am having [name] start on Shingrix. I am also having her maintain her omeprazole, Cholecalciferol (VITAMIN D-3 PO), rosuvastatin, valsartan, Misc Natural Products (NEURIVA PO), and alendronate.  Meds ordered this encounter  Medications   Zoster Vaccine Adjuvanted [facility]) injection    Sig: Inject 0.5 mLs into the muscle once for 1 dose.    Dispense:  0.5 mL    Refill:  1      Follow-up: Return in about 6 months (around 06/19/2021).  Scarlette Calico, MD

## 2020-12-21 DIAGNOSIS — Z23 Encounter for immunization: Secondary | ICD-10-CM | POA: Insufficient documentation

## 2020-12-21 DIAGNOSIS — N1831 Chronic kidney disease, stage 3a: Secondary | ICD-10-CM | POA: Insufficient documentation

## 2020-12-27 ENCOUNTER — Telehealth: Payer: Self-pay | Admitting: Internal Medicine

## 2020-12-27 NOTE — Telephone Encounter (Signed)
Pt. husband Has dropped off authorization for acces to MyChart on her behalf.  Will scan into chart.

## 2021-01-13 DIAGNOSIS — H35363 Drusen (degenerative) of macula, bilateral: Secondary | ICD-10-CM | POA: Diagnosis not present

## 2021-01-13 DIAGNOSIS — H43812 Vitreous degeneration, left eye: Secondary | ICD-10-CM | POA: Diagnosis not present

## 2021-01-13 DIAGNOSIS — H43822 Vitreomacular adhesion, left eye: Secondary | ICD-10-CM | POA: Diagnosis not present

## 2021-01-28 ENCOUNTER — Telehealth: Payer: Self-pay | Admitting: Internal Medicine

## 2021-01-28 NOTE — Telephone Encounter (Signed)
Pt has been scheduled for Tues 1/10 @ 9.20 for BP concerns.

## 2021-01-28 NOTE — Telephone Encounter (Signed)
Patient spouse Clair Gulling calling in  [place] he has been concerned about patient bp readings (162/89, 158/92, 183/98)  Says he would like a cb from nurse to discuss

## 2021-02-01 ENCOUNTER — Other Ambulatory Visit: Payer: Self-pay

## 2021-02-01 ENCOUNTER — Encounter: Payer: Self-pay | Admitting: Internal Medicine

## 2021-02-01 ENCOUNTER — Ambulatory Visit (INDEPENDENT_AMBULATORY_CARE_PROVIDER_SITE_OTHER): Payer: Medicare Other | Admitting: Internal Medicine

## 2021-02-01 VITALS — BP 138/82 | HR 80 | Temp 97.6°F | Resp 16 | Ht 65.0 in | Wt 166.0 lb

## 2021-02-01 DIAGNOSIS — F01B0 Vascular dementia, moderate, without behavioral disturbance, psychotic disturbance, mood disturbance, and anxiety: Secondary | ICD-10-CM | POA: Diagnosis not present

## 2021-02-01 DIAGNOSIS — L98491 Non-pressure chronic ulcer of skin of other sites limited to breakdown of skin: Secondary | ICD-10-CM

## 2021-02-01 DIAGNOSIS — I10 Essential (primary) hypertension: Secondary | ICD-10-CM | POA: Diagnosis not present

## 2021-02-01 DIAGNOSIS — N1831 Chronic kidney disease, stage 3a: Secondary | ICD-10-CM

## 2021-02-01 NOTE — Patient Instructions (Signed)

## 2021-02-01 NOTE — Progress Notes (Signed)
Subjective:  Patient ID: Vanessa Fuentes, female    DOB: 1945/11/04  Age: 76 y.o. MRN: [id number]  CC: Hypertension  This visit occurred during the SARS-CoV-2 public health emergency.  Safety protocols were in place, including screening questions prior to the visit, additional usage of staff PPE, and extensive cleaning of exam room while observing appropriate contact time as indicated for disinfecting solutions.    HPI Vanessa Fuentes presents for f/up -   She complains of a several month history of an issue with her right fifth toe.  She denies trauma or injury but says she has developed a callus.  Her husband tells me that she is doing well.  She is taking care of her self and completing her ADLs.  He monitors her blood pressure and it is rarely elevated.  Outpatient Medications Prior to Visit  Medication Sig Dispense Refill   alendronate (FOSAMAX) 70 MG tablet Take 1 tablet (70 mg total) by mouth once a week. Take with a full glass of water on an empty stomach. 12 tablet 0   Cholecalciferol (VITAMIN D-3 PO) Take 1,000 Units by mouth daily.     Misc Natural Products (NEURIVA PO) Take 1 tablet by mouth daily.     omeprazole (PRILOSEC) 40 MG capsule Take 1 capsule (40 mg total) by mouth daily. 90 capsule 1   rosuvastatin (CRESTOR) 5 MG tablet TAKE 1 TABLET BY MOUTH AT BEDTIME 90 tablet 0   valsartan (DIOVAN) 160 MG tablet TAKE 1 TABLET BY MOUTH EVERY DAY 90 tablet 1   No facility-administered medications prior to visit.    ROS Review of Systems  Constitutional:  Negative for diaphoresis, fatigue and unexpected weight change.  HENT: Negative.    Eyes: Negative.   Respiratory:  Negative for cough, chest tightness, shortness of breath and wheezing.   Cardiovascular:  Negative for chest pain, palpitations and leg swelling.  Gastrointestinal:  Negative for abdominal pain, constipation, diarrhea, nausea and vomiting.  Endocrine: Negative.   Genitourinary: Negative.    Musculoskeletal: Negative.  Negative for arthralgias.  Skin: Negative.   Neurological: Negative.  Negative for dizziness and weakness.  Psychiatric/Behavioral:  Positive for confusion and decreased concentration. Negative for agitation, behavioral problems, dysphoric mood and sleep disturbance. The patient is not nervous/anxious.    Objective:  BP 138/82 (BP Location: Left Arm, Patient Position: Sitting, Cuff Size: Large)    Pulse 80    Temp 97.6 F (36.4 C) (Oral)    Resp 16    Ht 5\' 5"  (1.651 m)    Wt 166 lb (75.3 kg)    SpO2 96%    BMI 27.62 kg/m   BP Readings from Last 3 Encounters:  02/01/21 138/82  12/20/20 134/86  11/03/20 130/80    Wt Readings from Last 3 Encounters:  02/01/21 166 lb (75.3 kg)  12/20/20 168 lb (76.2 kg)  11/03/20 164 lb 6.4 oz (74.6 kg)    Physical Exam Vitals reviewed.  Constitutional:      Appearance: Normal appearance.  HENT:     Nose: Nose normal.     Mouth/Throat:     Mouth: Mucous membranes are moist.  Eyes:     General: No scleral icterus.    Conjunctiva/sclera: Conjunctivae normal.  Cardiovascular:     Rate and Rhythm: Normal rate and regular rhythm.     Pulses:          Dorsalis pedis pulses are 1+ on the right side and 1+ on the left side.  Pulmonary:     Effort: Pulmonary effort is normal.     Breath sounds: No stridor. No wheezing, rhonchi or rales.  Abdominal:     General: Abdomen is flat.     Palpations: There is no mass.     Tenderness: There is no abdominal tenderness. There is no guarding.     Hernia: No hernia is present.  Musculoskeletal:     Cervical back: Neck supple.       Feet:  Feet:     Right foot:     Skin integrity: Ulcer and callus present.  Lymphadenopathy:     Cervical: No cervical adenopathy.  Skin:    General: Skin is warm and dry.  Neurological:     General: No focal deficit present.     Mental Status: She is alert.  Psychiatric:        Mood and Affect: Mood normal.        Behavior: Behavior  normal.    Lab Results  Component Value Date   WBC 7.6 12/20/2020   HGB 13.5 12/20/2020   HCT 40.2 12/20/2020   PLT 188.0 12/20/2020   GLUCOSE 100 (H) 12/20/2020   CHOL 183 06/16/2020   TRIG 89.0 06/16/2020   HDL 66.10 06/16/2020   LDLDIRECT 148.3 04/13/2011   LDLCALC 99 06/16/2020   ALT 14 06/16/2020   AST 19 06/16/2020   NA 139 12/20/2020   K 4.1 12/20/2020   CL 102 12/20/2020   CREATININE 1.06 12/20/2020   BUN 14 12/20/2020   CO2 28 12/20/2020   TSH 0.97 06/16/2020   HGBA1C 5.0 08/10/2015    MR Brain Wo Contrast  Result Date: 07/19/2020 CLINICAL DATA:  76 year old female with memory loss. EXAM: MRI HEAD WITHOUT CONTRAST TECHNIQUE: Multiplanar, multiecho pulse sequences of the brain and surrounding structures were obtained without intravenous contrast. COMPARISON:  Cervical spine MRI 05/18/2009. FINDINGS: Brain: No restricted diffusion to suggest acute infarction. No midline shift, mass effect, evidence of mass lesion, ventriculomegaly, extra-axial collection or acute intracranial hemorrhage. Cervicomedullary junction and pituitary are within normal limits. No cortical encephalomalacia identified. Patchy, widely scattered, moderate for age cerebral white matter T2 and FLAIR hyperintensity is in a nonspecific pattern. No chronic cerebral blood products identified. Mild for age T2 heterogeneity in both medial thalami. The bilateral basal ganglia, brainstem, cerebellum, and mesial temporal lobe structures appear within normal limits for age. Vascular: Major intracranial vascular flow voids are preserved. Skull and upper cervical spine: Partially visible cervical spine ACDF is new since 2011. No visible upper cervical spinal stenosis. Normal bone marrow signal. Sinuses/Orbits: Postoperative changes to both globes, otherwise negative orbits. Trace paranasal sinus fluid and/or mucosal thickening including in the dependent portion of the left sphenoid sinus. This appears inconsequential.  Other: Mastoids are clear. Grossly normal visible internal auditory structures. Visible scalp and face appear negative. IMPRESSION: 1. No acute intracranial abnormality. 2. Moderate for age signal changes in the cerebral white matter, with mild involvement of the thalami. This is most commonly due to chronic small vessel disease. Electronically Signed   By: Genevie Ann M.D.   On: 07/19/2020 07:18    Assessment & Plan:   Chaia was seen today for hypertension.  Diagnoses and all orders for this visit:  Callous ulcer, limited to breakdown of skin ([place]) -     Ambulatory referral to Podiatry  Stage 3a chronic kidney disease ([place])- Her renal function is stable.  She will avoid nephrotoxic agents.  Moderate vascular dementia without behavioral disturbance, psychotic  disturbance, mood disturbance, or anxiety- There are no signs of progression.  Will continue risk factor modifications.  Essential hypertension- Her blood pressure is adequately well controlled.   I am having [name] maintain her omeprazole, Cholecalciferol (VITAMIN D-3 PO), rosuvastatin, valsartan, Misc Natural Products (NEURIVA PO), and alendronate.  No orders of the defined types were placed in this encounter.    Follow-up: Return in about 6 months (around 08/01/2021).  Scarlette Calico, MD

## 2021-02-02 DIAGNOSIS — F01B0 Vascular dementia, moderate, without behavioral disturbance, psychotic disturbance, mood disturbance, and anxiety: Secondary | ICD-10-CM | POA: Insufficient documentation

## 2021-02-02 DIAGNOSIS — F01A0 Vascular dementia, mild, without behavioral disturbance, psychotic disturbance, mood disturbance, and anxiety: Secondary | ICD-10-CM | POA: Insufficient documentation

## 2021-02-24 ENCOUNTER — Other Ambulatory Visit: Payer: Self-pay | Admitting: Internal Medicine

## 2021-02-24 DIAGNOSIS — I10 Essential (primary) hypertension: Secondary | ICD-10-CM

## 2021-02-24 DIAGNOSIS — K2100 Gastro-esophageal reflux disease with esophagitis, without bleeding: Secondary | ICD-10-CM

## 2021-03-14 ENCOUNTER — Ambulatory Visit: Payer: Medicare Other | Admitting: Podiatry

## 2021-03-15 ENCOUNTER — Encounter: Payer: Self-pay | Admitting: Podiatry

## 2021-03-15 ENCOUNTER — Ambulatory Visit (INDEPENDENT_AMBULATORY_CARE_PROVIDER_SITE_OTHER): Payer: Medicare Other | Admitting: Podiatry

## 2021-03-15 ENCOUNTER — Other Ambulatory Visit: Payer: Self-pay

## 2021-03-15 ENCOUNTER — Ambulatory Visit (INDEPENDENT_AMBULATORY_CARE_PROVIDER_SITE_OTHER): Payer: Medicare Other

## 2021-03-15 DIAGNOSIS — M205X1 Other deformities of toe(s) (acquired), right foot: Secondary | ICD-10-CM

## 2021-03-15 DIAGNOSIS — L84 Corns and callosities: Secondary | ICD-10-CM | POA: Diagnosis not present

## 2021-03-15 NOTE — Progress Notes (Signed)
°  Subjective:  Patient ID: Vanessa Fuentes, female    DOB: 11-25-1945,   MRN: [id number]  Chief Complaint  Patient presents with   Callouses    Right foot callus - in between 4th and 81th toe    76 y.o. female presents for concern of right foot pain and possible callus that has been present for about 90 days. Relates it is on the right pinky toe. Relates burning pain . Takes OTC medication for it when it is painful. Relates it affects her gait and is painful to walk. . Denies any other pedal complaints. Denies n/v/f/c.   Past Medical History:  Diagnosis Date   Brachial neuritis or radiculitis NOS    Cervicalgia    Degeneration of cervical intervertebral disc    Degeneration of lumbar or lumbosacral intervertebral disc    Dysfunction of eustachian tube    Osteoporosis 2017   Spinal stenosis in cervical region    Unspecified essential hypertension     Objective:  Physical Exam: Vascular: DP/PT pulses 2/4 bilateral. CFT <3 seconds. Normal hair growth on digits. No edema.  Skin. No lacerations or abrasions bilateral feet. Hyperkeratotic lesion noted to medial right fifth digit.  Musculoskeletal: MMT 5/5 bilateral lower extremities in DF, PF, Inversion and Eversion. Deceased ROM in DF of ankle joint. Adductovarus of fifth digit. Adductovarus of fifth digit.  Neurological: Sensation intact to light touch.   Assessment:   1. 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 Corns and callosities      Plan:  Patient was evaluated and treated and all questions answered. -Xrays reviewed Taken of left foot patient actually with right foot pain but here for callus trim.   --Discussed corns and calluses and adductovarus with patient and treatment options.  -Hyperkeratotic tissue was debrided with chisel without incident.  -Encouraged daily moisturizing -Discussed use of pumice stone -Advised good supportive shoes and inserts -Patient to return to office as needed or sooner if  condition worsens.    Lorenda Peck, DPM

## 2021-04-21 DIAGNOSIS — Z124 Encounter for screening for malignant neoplasm of cervix: Secondary | ICD-10-CM | POA: Diagnosis not present

## 2021-04-21 DIAGNOSIS — Z6828 Body mass index (BMI) 28.0-28.9, adult: Secondary | ICD-10-CM | POA: Diagnosis not present

## 2021-05-23 ENCOUNTER — Telehealth: Payer: Self-pay | Admitting: Diagnostic Neuroimaging

## 2021-05-23 NOTE — Telephone Encounter (Signed)
Pt husband calling requesting to speak with nurse for advice when pt is not present after pt's appt tomorrow.  ?States phone call would be okay after appt as well.  ?[phone] ?

## 2021-05-23 NOTE — Telephone Encounter (Signed)
Noted. Can give him dementia packet at appointment if needed.  ?

## 2021-05-23 NOTE — Telephone Encounter (Signed)
Called husband who stated everything is basically status quo with short term memory issues. He stated that he scheduled follow up to discuss if there is a process to get a definite diagnosis, a medication to slow it down, wants to know what to expect, how to prepare. I advised him I have a dementia packet we can give him. I informed him will discuss with MD and call him back. He  verbalized understanding, appreciation. ? ?

## 2021-05-24 ENCOUNTER — Ambulatory Visit (INDEPENDENT_AMBULATORY_CARE_PROVIDER_SITE_OTHER): Payer: Medicare Other | Admitting: Diagnostic Neuroimaging

## 2021-05-24 ENCOUNTER — Encounter: Payer: Self-pay | Admitting: Diagnostic Neuroimaging

## 2021-05-24 VITALS — BP 133/83 | HR 71 | Ht 65.0 in | Wt 163.0 lb

## 2021-05-24 DIAGNOSIS — R413 Other amnesia: Secondary | ICD-10-CM

## 2021-05-24 DIAGNOSIS — F03A0 Unspecified dementia, mild, without behavioral disturbance, psychotic disturbance, mood disturbance, and anxiety: Secondary | ICD-10-CM

## 2021-05-24 MED ORDER — MEMANTINE HCL 10 MG PO TABS: 10.0000 mg | ORAL_TABLET | Freq: Two times a day (BID) | ORAL | 12 refills | Status: DC

## 2021-05-24 NOTE — Progress Notes (Signed)
? ?[facility] ? ?PATIENT: Vanessa Fuentes ?DOB: 05-25-1945 ? ?REFERRING CLINICIAN: Janith Lima, MD ?HISTORY FROM: patient and husband  ?REASON FOR VISIT: follow up ? ? ?HISTORICAL ? ?CHIEF COMPLAINT:  ?Chief Complaint  ?Patient presents with  ? Memory Loss  ?  Rm 7 "FU requested to discuss medication" husband- Jimmie  ? ? ?HISTORY OF PRESENT ILLNESS:  ? ?UPDATE (05/24/21, VRP): Since last visit, doing about the same. Symptoms are progressive. Asking about meds and tx options.  ? ?UPDATE (08/18/20, VRP): Since last visit, doing about the same. MRI brain reviewed. Patient feels well.  ? ?PRIOR HPI: 76 year old female here for evaluation of memory loss.  For past 64-monthpatient's had onset of short-term memory loss and confusion, poor insight, decline in ADLs.  Symptoms noted mainly by family.  Patient went to PCP for evaluation and then referred to neurology.  No headaches, numbness, weakness, gait or balance issues. ? ? ?REVIEW OF SYSTEMS: Full 14 system review of systems performed and negative with exception of: As per HPI. ? ?ALLERGIES: ?Allergies  ?Allergen Reactions  ? Sulfonamide Derivatives   ?  Blisters eyes  ? ? ?HOME MEDICATIONS: ?Outpatient Medications Prior to Visit  ?Medication Sig Dispense Refill  ? alendronate (FOSAMAX) 70 MG tablet Take 1 tablet (70 mg total) by mouth once a week. Take with a full glass of water on an empty stomach. 12 tablet 0  ? Cholecalciferol (VITAMIN D-3 PO) Take 1,000 Units by mouth daily.    ? Misc Natural Products (NEURIVA PO) Take 1 tablet by mouth daily.    ? omeprazole (PRILOSEC) 40 MG capsule TAKE 1 CAPSULE BY MOUTH EVERY DAY 90 capsule 1  ? rosuvastatin (CRESTOR) 5 MG tablet TAKE 1 TABLET BY MOUTH AT BEDTIME 90 tablet 0  ? valsartan (DIOVAN) 160 MG tablet TAKE 1 TABLET BY MOUTH EVERY DAY 90 tablet 1  ? ?No facility-administered medications prior to visit.  ? ? ?PAST MEDICAL HISTORY: ?Past Medical History:  ?Diagnosis Date  ? Brachial neuritis or  radiculitis NOS   ? Cervicalgia   ? Degeneration of cervical intervertebral disc   ? Degeneration of lumbar or lumbosacral intervertebral disc   ? Dysfunction of eustachian tube   ? Memory loss   ? Osteoporosis 01/24/2015  ? Spinal stenosis in cervical region   ? Unspecified essential hypertension   ? ? ?PAST SURGICAL HISTORY: ?Past Surgical History:  ?Procedure Laterality Date  ? CERVICAL DISCECTOMY  07/2009  ? C3-4 with fusion- Dr PJanine Ores ? COLONOSCOPY    ? polypectomy  ? [place]  [place] [facility]  ? SPINE SURGERY    ? TUBAL LIGATION    ? ? ?FAMILY HISTORY: ?Family History  ?Problem Relation Age of Onset  ? Hypertension Mother   ? Heart disease Mother   ?     CAD MI CABG  ? Cancer Mother   ? Heart disease Father   ?     CAD MI  ? Hypertension Father   ? Dementia Brother   ? Heart disease Brother   ?     CAD  ? Esophageal cancer Brother   ? COPD Brother   ? Cancer Maternal Aunt   ?     Breast  ? Diabetes Neg Hx   ? Colon cancer Neg Hx   ? Stomach cancer Neg Hx   ? ? ?SOCIAL HISTORY: ?Social History  ? ?Socioeconomic History  ? Marital status: Married  ?  Spouse name: Clair Gulling  ? Number of children: 1  ? Years of education: Not on file  ? Highest education level: Some college, no degree  ?Occupational History  ? Occupation: retired  ?Tobacco Use  ? Smoking status: Former  ?  Types: Cigarettes  ?  Quit date: 02/16/1981  ?  Years since quitting: 40.2  ? Smokeless tobacco: Never  ? Tobacco comments:  ?  During Adolescence  ?Substance and Sexual Activity  ? Alcohol use: Yes  ?  Alcohol/week: 2.0 standard drinks  ?  Types: 2 Standard drinks or equivalent per week  ?  Comment: seldom  ? Drug use: No  ? Sexual activity: Yes  ?  Partners: Male  ?Other Topics Concern  ? Not on file  ?Social History Narrative  ? 05/24/21 lives with husband  ? 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 Married in 1969, one daughter-'73 and 2 grandchildren.  Work- retired from  [employer] October '12 after 43 years. Life is good.   ? ?Social Determinants of  Health  ? ?Financial Resource Strain: Low Risk   ? Difficulty of Paying Living Expenses: Not hard at all  ?Food Insecurity: No Food Insecurity  ? Worried About Charity fundraiser in the Last Year: Never true  ? Ran Out of Food in the Last Year: Never true  ?Transportation Needs: No Transportation Needs  ? Lack of Transportation (Medical): No  ? Lack of Transportation (Non-Medical): No  ?Physical Activity: Sufficiently Active  ? Days of Exercise per Week: 5 days  ? Minutes of Exercise per Session: 30 min  ?Stress: No Stress Concern Present  ? Feeling of Stress : Not at all  ?Social Connections: Socially Integrated  ? Frequency of Communication with Friends and Family: More than three times a week  ? Frequency of Social Gatherings with Friends and Family: More than three times a week  ? Attends Religious Services: More than 4 times per year  ? Active Member of Clubs or Organizations: Yes  ? Attends Archivist Meetings: More than 4 times per year  ? Marital Status: Married  ?Intimate Partner Violence: Not At Risk  ? Fear of Current or Ex-Partner: No  ? Emotionally Abused: No  ? Physically Abused: No  ? Sexually Abused: No  ? ? ? ?PHYSICAL EXAM ? ?GENERAL EXAM/CONSTITUTIONAL: ?Vitals:  ?Vitals:  ? 05/24/21 1533  ?BP: 133/83  ?Pulse: 71  ?Weight: 163 lb (73.9 kg)  ?Height: '5\' 5"'  (1.651 m)  ? ?Body mass index is 27.12 kg/m?. ?Wt Readings from Last 3 Encounters:  ?05/24/21 163 lb (73.9 kg)  ?02/01/21 166 lb (75.3 kg)  ?12/20/20 168 lb (76.2 kg)  ? ?Patient is in no distress; well developed, nourished and groomed; neck is supple ? ?CARDIOVASCULAR: ?Examination of carotid arteries is normal; no carotid bruits ?Regular rate and rhythm, no murmurs ?Examination of peripheral vascular system by observation and palpation is normal ? ?EYES: ?Ophthalmoscopic exam of optic discs and posterior segments is normal; no papilledema or hemorrhages ?No results found. ? ?MUSCULOSKELETAL: ?Gait, strength, tone, movements noted in  Neurologic exam below ? ?NEUROLOGIC: ?MENTAL STATUS:  ? ?  07/05/2020  ? 10:27 AM  ?MMSE - Mini Mental State Exam  ?Orientation to time 3  ?Orientation to Place 4  ?Registration 3  ?Attention/ Calculation 1  ?Recall 0  ?Language- name 2 objects 2  ?Language- repeat 0  ?Language- follow 3 step command 3  ?Language- read & follow direction 1  ?Write a sentence 1  ?Copy  design 1  ?Total score 19  ? ?awake, alert, oriented to person, place and time ?recent and remote memory intact ?normal attention and concentration ?language fluent, comprehension intact, naming intact ?fund of knowledge appropriate ? ?CRANIAL NERVE:  ?2nd - no papilledema on fundoscopic exam ?2nd, 3rd, 4th, 6th - pupils equal and reactive to light, visual fields full to confrontation, extraocular muscles intact, no nystagmus ?5th - facial sensation symmetric ?7th - facial strength symmetric ?8th - hearing intact ?9th - palate elevates symmetrically, uvula midline ?11th - shoulder shrug symmetric ?12th - tongue protrusion midline ? ?MOTOR:  ?normal bulk and tone, full strength in the BUE, BLE ? ?SENSORY:  ?normal and symmetric to light touch, temperature, vibration ? ?COORDINATION:  ?finger-nose-finger, fine finger movements normal ? ?REFLEXES:  ?deep tendon reflexes TRACE and symmetric ? ?GAIT/STATION:  ?narrow based gait ? ? ? ? ?DIAGNOSTIC DATA (LABS, IMAGING, TESTING) ?- I reviewed patient records, labs, notes, testing and imaging myself where available. ? ?Lab Results  ?Component Value Date  ? WBC 7.6 12/20/2020  ? HGB 13.5 12/20/2020  ? HCT 40.2 12/20/2020  ? MCV 89.1 12/20/2020  ? PLT 188.0 12/20/2020  ? ?   ?Component Value Date/Time  ? NA 139 12/20/2020 1550  ? K 4.1 12/20/2020 1550  ? CL 102 12/20/2020 1550  ? CO2 28 12/20/2020 1550  ? GLUCOSE 100 (H) 12/20/2020 1550  ? BUN 14 12/20/2020 1550  ? CREATININE 1.06 12/20/2020 1550  ? CALCIUM 9.5 12/20/2020 1550  ? PROT 7.2 06/16/2020 1122  ? ALBUMIN 4.5 06/16/2020 1122  ? AST 19 06/16/2020 1122  ?  ALT 14 06/16/2020 1122  ? ALKPHOS 57 06/16/2020 1122  ? BILITOT 0.7 06/16/2020 1122  ? GFRNONAA >60 08/04/2009 0852  ? GFRAA  08/04/2009 2585  ?  >60        ?The eGFR has been calculated ?using the MDRD

## 2021-05-24 NOTE — Patient Instructions (Signed)
?-   start memantine '10mg'$  at bedtime; then '10mg'$  twice a day ?

## 2021-05-24 NOTE — Telephone Encounter (Signed)
Patient has follow up today

## 2021-06-30 DIAGNOSIS — D2262 Melanocytic nevi of left upper limb, including shoulder: Secondary | ICD-10-CM | POA: Diagnosis not present

## 2021-07-07 DIAGNOSIS — H43812 Vitreous degeneration, left eye: Secondary | ICD-10-CM | POA: Diagnosis not present

## 2021-07-07 DIAGNOSIS — H3540 Unspecified peripheral retinal degeneration: Secondary | ICD-10-CM | POA: Diagnosis not present

## 2021-07-07 DIAGNOSIS — H35363 Drusen (degenerative) of macula, bilateral: Secondary | ICD-10-CM | POA: Diagnosis not present

## 2021-07-07 DIAGNOSIS — H43822 Vitreomacular adhesion, left eye: Secondary | ICD-10-CM | POA: Diagnosis not present

## 2021-07-18 ENCOUNTER — Ambulatory Visit: Payer: Medicare Other | Admitting: Diagnostic Neuroimaging

## 2021-07-18 ENCOUNTER — Ambulatory Visit: Payer: PRIVATE HEALTH INSURANCE | Admitting: Diagnostic Neuroimaging

## 2021-08-25 ENCOUNTER — Other Ambulatory Visit: Payer: Self-pay | Admitting: Internal Medicine

## 2021-08-25 DIAGNOSIS — I10 Essential (primary) hypertension: Secondary | ICD-10-CM

## 2021-08-25 DIAGNOSIS — K2100 Gastro-esophageal reflux disease with esophagitis, without bleeding: Secondary | ICD-10-CM

## 2021-08-31 ENCOUNTER — Telehealth: Payer: Self-pay | Admitting: Internal Medicine

## 2021-08-31 NOTE — Telephone Encounter (Signed)
Caller & Relationship to patient: Vanessa Fuentes. Spouse  Call back number: [phone]  Date of last office visit: 02/01/21  Date of next office visit: 10/03/21  Medication(s) to be refilled: omeprazole (PRILOSEC) 40 MG capsule  Preferred Pharmacy:  [facility] [place], [place] - [address] Phone:  [phone]  Fax:  [phone]      Pt is completely out of rx.

## 2021-09-01 ENCOUNTER — Other Ambulatory Visit: Payer: Self-pay | Admitting: Internal Medicine

## 2021-09-01 ENCOUNTER — Encounter: Payer: Self-pay | Admitting: Internal Medicine

## 2021-09-01 DIAGNOSIS — Z1231 Encounter for screening mammogram for malignant neoplasm of breast: Secondary | ICD-10-CM | POA: Diagnosis not present

## 2021-09-01 DIAGNOSIS — K2100 Gastro-esophageal reflux disease with esophagitis, without bleeding: Secondary | ICD-10-CM

## 2021-09-01 MED ORDER — OMEPRAZOLE 40 MG PO CPDR: 40.0000 mg | DELAYED_RELEASE_CAPSULE | Freq: Every day | ORAL | 0 refills | Status: DC

## 2021-09-29 DIAGNOSIS — D692 Other nonthrombocytopenic purpura: Secondary | ICD-10-CM | POA: Diagnosis not present

## 2021-09-29 DIAGNOSIS — L814 Other melanin hyperpigmentation: Secondary | ICD-10-CM | POA: Diagnosis not present

## 2021-09-29 DIAGNOSIS — D225 Melanocytic nevi of trunk: Secondary | ICD-10-CM | POA: Diagnosis not present

## 2021-09-29 DIAGNOSIS — L821 Other seborrheic keratosis: Secondary | ICD-10-CM | POA: Diagnosis not present

## 2021-10-03 ENCOUNTER — Encounter: Payer: Self-pay | Admitting: Internal Medicine

## 2021-10-03 ENCOUNTER — Ambulatory Visit (INDEPENDENT_AMBULATORY_CARE_PROVIDER_SITE_OTHER): Payer: Medicare Other | Admitting: Internal Medicine

## 2021-10-03 VITALS — BP 126/82 | HR 76 | Temp 98.0°F | Ht 65.0 in | Wt 164.0 lb

## 2021-10-03 DIAGNOSIS — N1831 Chronic kidney disease, stage 3a: Secondary | ICD-10-CM | POA: Diagnosis not present

## 2021-10-03 DIAGNOSIS — I10 Essential (primary) hypertension: Secondary | ICD-10-CM

## 2021-10-03 DIAGNOSIS — E785 Hyperlipidemia, unspecified: Secondary | ICD-10-CM | POA: Diagnosis not present

## 2021-10-03 DIAGNOSIS — K2100 Gastro-esophageal reflux disease with esophagitis, without bleeding: Secondary | ICD-10-CM

## 2021-10-03 DIAGNOSIS — Z23 Encounter for immunization: Secondary | ICD-10-CM | POA: Diagnosis not present

## 2021-10-03 LAB — LIPID PANEL
Cholesterol: 192 mg/dL (ref 0–200)
HDL: 59.2 mg/dL (ref >39.00)
LDL Cholesterol: 118 mg/dL — ABNORMAL HIGH (ref 0–99)
NonHDL: 132.97
Total CHOL/HDL Ratio: 3
Triglycerides: 76 mg/dL (ref 0.0–149.0)
VLDL: 15.2 mg/dL (ref 0.0–40.0)

## 2021-10-03 LAB — URINALYSIS, ROUTINE W REFLEX MICROSCOPIC
Bilirubin Urine: NEGATIVE
Hgb urine dipstick: NEGATIVE
Ketones, ur: NEGATIVE
Leukocytes,Ua: NEGATIVE
Nitrite: NEGATIVE
RBC / HPF: NONE SEEN (ref 0–?)
Specific Gravity, Urine: 1.01 (ref 1.000–1.030)
Total Protein, Urine: NEGATIVE
Urine Glucose: NEGATIVE
Urobilinogen, UA: 0.2 (ref 0.0–1.0)
pH: 6 (ref 5.0–8.0)

## 2021-10-03 LAB — HEPATIC FUNCTION PANEL
ALT: 13 U/L (ref 0–35)
AST: 18 U/L (ref 0–37)
Albumin: 4.1 g/dL (ref 3.5–5.2)
Alkaline Phosphatase: 61 U/L (ref 39–117)
Bilirubin, Direct: 0.1 mg/dL (ref 0.0–0.3)
Total Bilirubin: 0.6 mg/dL (ref 0.2–1.2)
Total Protein: 6.8 g/dL (ref 6.0–8.3)

## 2021-10-03 LAB — BASIC METABOLIC PANEL
BUN: 10 mg/dL (ref 6–23)
CO2: 28 mEq/L (ref 19–32)
Calcium: 9.5 mg/dL (ref 8.4–10.5)
Chloride: 102 mEq/L (ref 96–112)
Creatinine, Ser: 1 mg/dL (ref 0.40–1.20)
GFR: 55.03 mL/min — ABNORMAL LOW (ref >60.00)
Glucose, Bld: 106 mg/dL — ABNORMAL HIGH (ref 70–99)
Potassium: 4 mEq/L (ref 3.5–5.1)
Sodium: 136 mEq/L (ref 135–145)

## 2021-10-03 LAB — CBC WITH DIFFERENTIAL/PLATELET
Basophils Absolute: 0.1 10*3/uL (ref 0.0–0.1)
Basophils Relative: 1 % (ref 0.0–3.0)
Eosinophils Absolute: 0.1 10*3/uL (ref 0.0–0.7)
Eosinophils Relative: 1.2 % (ref 0.0–5.0)
HCT: 40.9 % (ref 36.0–46.0)
Hemoglobin: 13.8 g/dL (ref 12.0–15.0)
Lymphocytes Relative: 18.6 % (ref 12.0–46.0)
Lymphs Abs: 1.4 10*3/uL (ref 0.7–4.0)
MCHC: 33.8 g/dL (ref 30.0–36.0)
MCV: 88.8 fl (ref 78.0–100.0)
Monocytes Absolute: 0.4 10*3/uL (ref 0.1–1.0)
Monocytes Relative: 5.3 % (ref 3.0–12.0)
Neutro Abs: 5.5 10*3/uL (ref 1.4–7.7)
Neutrophils Relative %: 73.9 % (ref 43.0–77.0)
Platelets: 202 10*3/uL (ref 150.0–400.0)
RBC: 4.61 Mil/uL (ref 3.87–5.11)
RDW: 13.6 % (ref 11.5–15.5)
WBC: 7.4 10*3/uL (ref 4.0–10.5)

## 2021-10-03 NOTE — Patient Instructions (Signed)
Hypertension, Adult High blood pressure (hypertension) is when the force of blood pumping through the arteries is too strong. The arteries are the blood vessels that carry blood from the heart throughout the body. Hypertension forces the heart to work harder to pump blood and may cause arteries to become narrow or stiff. Untreated or uncontrolled hypertension can lead to a heart attack, heart failure, a stroke, kidney disease, and other problems. A blood pressure reading consists of a higher number over a lower number. Ideally, your blood pressure should be below 120/80. The first ("top") number is called the systolic pressure. It is a measure of the pressure in your arteries as your heart beats. The second ("bottom") number is called the diastolic pressure. It is a measure of the pressure in your arteries as the heart relaxes. What are the causes? The exact cause of this condition is not known. There are some conditions that result in high blood pressure. What increases the risk? Certain factors may make you more likely to develop high blood pressure. Some of these risk factors are under your control, including: Smoking. Not getting enough exercise or physical activity. Being overweight. Having too much fat, sugar, calories, or salt (sodium) in your diet. Drinking too much alcohol. Other risk factors include: Having a personal history of heart disease, diabetes, high cholesterol, or kidney disease. Stress. Having a family history of high blood pressure and high cholesterol. Having obstructive sleep apnea. Age. The risk increases with age. What are the signs or symptoms? High blood pressure may not cause symptoms. Very high blood pressure (hypertensive crisis) may cause: Headache. Fast or irregular heartbeats (palpitations). Shortness of breath. Nosebleed. Nausea and vomiting. Vision changes. Severe chest pain, dizziness, and seizures. How is this diagnosed? This condition is diagnosed by  measuring your blood pressure while you are seated, with your arm resting on a flat surface, your legs uncrossed, and your feet flat on the floor. The cuff of the blood pressure monitor will be placed directly against the skin of your upper arm at the level of your heart. Blood pressure should be measured at least twice using the same arm. Certain conditions can cause a difference in blood pressure between your right and left arms. If you have a high blood pressure reading during one visit or you have normal blood pressure with other risk factors, you may be asked to: Return on a different day to have your blood pressure checked again. Monitor your blood pressure at home for 1 week or longer. If you are diagnosed with hypertension, you may have other blood or imaging tests to help your health care provider understand your overall risk for other conditions. How is this treated? This condition is treated by making healthy lifestyle changes, such as eating healthy foods, exercising more, and reducing your alcohol intake. You may be referred for counseling on a healthy diet and physical activity. Your health care provider may prescribe medicine if lifestyle changes are not enough to get your blood pressure under control and if: Your systolic blood pressure is above 130. Your diastolic blood pressure is above 80. Your personal target blood pressure may vary depending on your medical conditions, your age, and other factors. Follow these instructions at home: Eating and drinking  Eat a diet that is high in fiber and potassium, and low in sodium, added sugar, and fat. An example of this eating plan is called the DASH diet. DASH stands for Dietary Approaches to Stop Hypertension. To eat this way: Eat   plenty of fresh fruits and vegetables. Try to fill one half of your plate at each meal with fruits and vegetables. Eat whole grains, such as whole-wheat pasta, brown rice, or whole-grain bread. Fill about one  fourth of your plate with whole grains. Eat or drink low-fat dairy products, such as skim milk or low-fat yogurt. Avoid fatty cuts of meat, processed or cured meats, and poultry with skin. Fill about one fourth of your plate with lean proteins, such as fish, chicken without skin, beans, eggs, or tofu. Avoid pre-made and processed foods. These tend to be higher in sodium, added sugar, and fat. Reduce your daily sodium intake. Many people with hypertension should eat less than 1,500 mg of sodium a day. Do not drink alcohol if: Your health care provider tells you not to drink. You are pregnant, may be pregnant, or are planning to become pregnant. If you drink alcohol: Limit how much you have to: 0-1 drink a day for women. 0-2 drinks a day for men. Know how much alcohol is in your drink. In the [place] one drink equals one 12 oz bottle of beer (355 mL), one 5 oz glass of wine (148 mL), or one 1 oz glass of hard liquor (44 mL). Lifestyle  Work with your health care provider to maintain a healthy body weight or to lose weight. Ask what an ideal weight is for you. Get at least 30 minutes of exercise that causes your heart to beat faster (aerobic exercise) most days of the week. Activities may include walking, swimming, or biking. Include exercise to strengthen your muscles (resistance exercise), such as Pilates or lifting weights, as part of your weekly exercise routine. Try to do these types of exercises for 30 minutes at least 3 days a week. Do not use any products that contain nicotine or tobacco. These products include cigarettes, chewing tobacco, and vaping devices, such as e-cigarettes. If you need help quitting, ask your health care provider. Monitor your blood pressure at home as told by your health care provider. Keep all follow-up visits. This is important. Medicines Take over-the-counter and prescription medicines only as told by your health care provider. Follow directions carefully. Blood  pressure medicines must be taken as prescribed. Do not skip doses of blood pressure medicine. Doing this puts you at risk for problems and can make the medicine less effective. Ask your health care provider about side effects or reactions to medicines that you should watch for. Contact a health care provider if you: Think you are having a reaction to a medicine you are taking. Have headaches that keep coming back (recurring). Feel dizzy. Have swelling in your ankles. Have trouble with your vision. Get help right away if you: Develop a severe headache or confusion. Have unusual weakness or numbness. Feel faint. Have severe pain in your chest or abdomen. Vomit repeatedly. Have trouble breathing. These symptoms may be an emergency. Get help right away. Call 911. Do not wait to see if the symptoms will go away. Do not drive yourself to the hospital. Summary Hypertension is when the force of blood pumping through your arteries is too strong. If this condition is not controlled, it may put you at risk for serious complications. Your personal target blood pressure may vary depending on your medical conditions, your age, and other factors. For most people, a normal blood pressure is less than 120/80. Hypertension is treated with lifestyle changes, medicines, or a combination of both. Lifestyle changes include losing weight, eating a healthy,   low-sodium diet, exercising more, and limiting alcohol. This information is not intended to replace advice given to you by your health care provider. Make sure you discuss any questions you have with your health care provider. Document Revised: 11/16/2020 Document Reviewed: 11/16/2020 Elsevier Patient Education  2023 Elsevier Inc.  

## 2021-10-03 NOTE — Progress Notes (Unsigned)
Subjective:  Patient ID: Vanessa Fuentes, female    DOB: [birth year]  Age: 76 y.o. MRN: [id number]  CC: No chief complaint on file.   HPI Vanessa Fuentes presents for ***  Outpatient Medications Prior to Visit  Medication Sig Dispense Refill   alendronate (FOSAMAX) 70 MG tablet Take 1 tablet (70 mg total) by mouth once a week. Take with a full glass of water on an empty stomach. 12 tablet 0   Cholecalciferol (VITAMIN D-3 PO) Take 1,000 Units by mouth daily.     memantine (NAMENDA) 10 MG tablet Take 1 tablet (10 mg total) by mouth 2 (two) times daily. 60 tablet 12   Misc Natural Products (NEURIVA PO) Take 1 tablet by mouth daily.     omeprazole (PRILOSEC) 40 MG capsule Take 1 capsule (40 mg total) by mouth daily. 90 capsule 0   rosuvastatin (CRESTOR) 5 MG tablet TAKE 1 TABLET BY MOUTH AT BEDTIME 90 tablet 0   valsartan (DIOVAN) 160 MG tablet TAKE 1 TABLET BY MOUTH EVERY DAY 90 tablet 1   No facility-administered medications prior to visit.    ROS Review of Systems  Objective:  BP 126/82 (BP Location: Left Arm, Patient Position: Sitting, Cuff Size: Large)   Pulse 76   Temp 98 F (36.7 C) (Oral)   Ht '5\' 5"'$  (1.651 m)   Wt 164 lb (74.4 kg)   SpO2 97%   BMI 27.29 kg/m   BP Readings from Last 3 Encounters:  10/03/21 126/82  05/24/21 133/83  02/01/21 138/82    Wt Readings from Last 3 Encounters:  10/03/21 164 lb (74.4 kg)  05/24/21 163 lb (73.9 kg)  02/01/21 166 lb (75.3 kg)    Physical Exam  Lab Results  Component Value Date   WBC 7.4 10/03/2021   HGB 13.8 10/03/2021   HCT 40.9 10/03/2021   PLT 202.0 10/03/2021   GLUCOSE 106 (H) 10/03/2021   CHOL 192 10/03/2021   TRIG 76.0 10/03/2021   HDL 59.20 10/03/2021   LDLDIRECT 148.3 04/13/2011   LDLCALC 118 (H) 10/03/2021   ALT 13 10/03/2021   AST 18 10/03/2021   NA 136 10/03/2021   K 4.0 10/03/2021   CL 102 10/03/2021   CREATININE 1.00 10/03/2021   BUN 10 10/03/2021   CO2 28 10/03/2021   TSH 0.97  06/16/2020   HGBA1C 5.0 08/10/2015    MR Brain Wo Contrast  Result Date: 07/19/2020 CLINICAL DATA:  76 year old female with memory loss. EXAM: MRI HEAD WITHOUT CONTRAST TECHNIQUE: Multiplanar, multiecho pulse sequences of the brain and surrounding structures were obtained without intravenous contrast. COMPARISON:  Cervical spine MRI 05/18/2009. FINDINGS: Brain: No restricted diffusion to suggest acute infarction. No midline shift, mass effect, evidence of mass lesion, ventriculomegaly, extra-axial collection or acute intracranial hemorrhage. Cervicomedullary junction and pituitary are within normal limits. No cortical encephalomalacia identified. Patchy, widely scattered, moderate for age cerebral white matter T2 and FLAIR hyperintensity is in a nonspecific pattern. No chronic cerebral blood products identified. Mild for age T2 heterogeneity in both medial thalami. The bilateral basal ganglia, brainstem, cerebellum, and mesial temporal lobe structures appear within normal limits for age. Vascular: Major intracranial vascular flow voids are preserved. Skull and upper cervical spine: Partially visible cervical spine ACDF is new since 2011. No visible upper cervical spinal stenosis. Normal bone marrow signal. Sinuses/Orbits: Postoperative changes to both globes, otherwise negative orbits. Trace paranasal sinus fluid and/or mucosal thickening including in the dependent portion of the left sphenoid sinus. This appears  inconsequential. Other: Mastoids are clear. Grossly normal visible internal auditory structures. Visible scalp and face appear negative. IMPRESSION: 1. No acute intracranial abnormality. 2. Moderate for age signal changes in the cerebral white matter, with mild involvement of the thalami. This is most commonly due to chronic small vessel disease. Electronically Signed   By: Genevie Ann M.D.   On: 07/19/2020 07:18    Assessment & Plan:   Diagnoses and all orders for this visit:  Essential  hypertension -     Basic metabolic panel; Future -     CBC with Differential/Platelet; Future -     Urinalysis, Routine w reflex microscopic; Future -     Hepatic function panel; Future -     Hepatic function panel -     Urinalysis, Routine w reflex microscopic -     CBC with Differential/Platelet -     Basic metabolic panel  Hyperlipidemia with target LDL less than 130 -     Lipid panel; Future -     Hepatic function panel; Future -     Hepatic function panel -     Lipid panel  Gastroesophageal reflux disease with esophagitis without hemorrhage -     CBC with Differential/Platelet; Future -     CBC with Differential/Platelet  Flu vaccine need -     Flu Vaccine QUAD High Dose(Fluad)  Stage 3a chronic kidney disease ([place])   I am having Vanessa Fuentes maintain her Cholecalciferol (VITAMIN D-3 PO), rosuvastatin, Misc Natural Products (NEURIVA PO), alendronate, valsartan, memantine, and omeprazole.  No orders of the defined types were placed in this encounter.    Follow-up: Return in about 6 months (around 04/03/2022).  Scarlette Calico, MD

## 2021-10-31 ENCOUNTER — Telehealth: Payer: Self-pay | Admitting: Internal Medicine

## 2021-10-31 NOTE — Telephone Encounter (Signed)
LVM for pt to rtn my call to schedule AWV with [facility] call back # [phone] 

## 2021-11-15 ENCOUNTER — Ambulatory Visit: Payer: Medicare Other | Admitting: *Deleted

## 2021-11-15 NOTE — Patient Instructions (Signed)

## 2021-11-15 NOTE — Progress Notes (Signed)
Erroneous encounter

## 2021-11-24 ENCOUNTER — Other Ambulatory Visit: Payer: Self-pay | Admitting: Internal Medicine

## 2021-11-24 DIAGNOSIS — E785 Hyperlipidemia, unspecified: Secondary | ICD-10-CM

## 2021-11-25 ENCOUNTER — Other Ambulatory Visit: Payer: Self-pay | Admitting: Internal Medicine

## 2021-11-25 DIAGNOSIS — E785 Hyperlipidemia, unspecified: Secondary | ICD-10-CM

## 2021-11-25 MED ORDER — ROSUVASTATIN CALCIUM 5 MG PO TABS: 5.0000 mg | ORAL_TABLET | Freq: Every day | ORAL | 1 refills | Status: DC

## 2021-11-29 DIAGNOSIS — M85852 Other specified disorders of bone density and structure, left thigh: Secondary | ICD-10-CM | POA: Diagnosis not present

## 2021-11-29 DIAGNOSIS — M810 Age-related osteoporosis without current pathological fracture: Secondary | ICD-10-CM | POA: Diagnosis not present

## 2021-11-29 DIAGNOSIS — Z780 Asymptomatic menopausal state: Secondary | ICD-10-CM | POA: Diagnosis not present

## 2021-12-26 ENCOUNTER — Other Ambulatory Visit: Payer: Self-pay | Admitting: Internal Medicine

## 2021-12-26 DIAGNOSIS — K2100 Gastro-esophageal reflux disease with esophagitis, without bleeding: Secondary | ICD-10-CM

## 2022-01-10 ENCOUNTER — Other Ambulatory Visit: Payer: Self-pay | Admitting: Internal Medicine

## 2022-01-10 DIAGNOSIS — I10 Essential (primary) hypertension: Secondary | ICD-10-CM

## 2022-03-27 ENCOUNTER — Other Ambulatory Visit: Payer: Self-pay | Admitting: *Deleted

## 2022-03-27 DIAGNOSIS — I10 Essential (primary) hypertension: Secondary | ICD-10-CM

## 2022-03-28 ENCOUNTER — Telehealth: Payer: Self-pay | Admitting: Internal Medicine

## 2022-03-28 DIAGNOSIS — I10 Essential (primary) hypertension: Secondary | ICD-10-CM

## 2022-03-28 MED ORDER — VALSARTAN 160 MG PO TABS: 160.0000 mg | ORAL_TABLET | Freq: Every day | ORAL | 0 refills | Status: DC

## 2022-03-28 NOTE — Telephone Encounter (Signed)
Patient called requesting a refill on her medication valsartan (DIOVAN) 160 MG tablet. Patient is requesting this refill to be sent to her pharmacy on file  [place] LU:[id number], [address]  . Best callback number is [phone].

## 2022-03-28 NOTE — Telephone Encounter (Signed)
Called pt back made appt for 06/05/22. Sent script to [facility],,/[facility]

## 2022-03-28 NOTE — Telephone Encounter (Signed)
I would like to see her every 6 months

## 2022-03-28 NOTE — Telephone Encounter (Signed)
Called pt gave her MD response. Pt states she is driving at the moment could I call back in 30 mins.Marland KitchenJohny Fuentes

## 2022-04-06 ENCOUNTER — Telehealth: Payer: Self-pay | Admitting: Internal Medicine

## 2022-04-06 ENCOUNTER — Other Ambulatory Visit: Payer: Self-pay | Admitting: Internal Medicine

## 2022-04-06 DIAGNOSIS — K2100 Gastro-esophageal reflux disease with esophagitis, without bleeding: Secondary | ICD-10-CM

## 2022-04-06 MED ORDER — OMEPRAZOLE 40 MG PO CPDR: 40.0000 mg | DELAYED_RELEASE_CAPSULE | Freq: Every day | ORAL | 0 refills | Status: DC

## 2022-04-06 NOTE — Telephone Encounter (Signed)
Prescription Request  04/06/2022  LOV: 10/03/2021  What is the name of the medication or equipment?  omeprazole (PRILOSEC) 40 MG capsule  Have you contacted your pharmacy to request a refill? No   Which pharmacy would you like this sent to?  [place] [facility]:[id number], [address] [place] [address] Phone: [phone] Fax: [phone]    Patient notified that their request is being sent to the clinical staff for review and that they should receive a response within 2 business days.   Please advise at Mobile [phone] (mobile)   Did not contact pharmacy because it is a pharmacy change. Next appointment is 06/05/22

## 2022-04-08 ENCOUNTER — Other Ambulatory Visit: Payer: Self-pay | Admitting: Internal Medicine

## 2022-04-08 DIAGNOSIS — I10 Essential (primary) hypertension: Secondary | ICD-10-CM

## 2022-04-25 ENCOUNTER — Encounter (HOSPITAL_COMMUNITY): Payer: Self-pay | Admitting: Emergency Medicine

## 2022-04-25 ENCOUNTER — Emergency Department (HOSPITAL_COMMUNITY)
Admission: EM | Admit: 2022-04-25 | Discharge: 2022-04-25 | Disposition: A | Discharge status: Home / Self Care | Payer: Medicare HMO | Attending: Emergency Medicine | Admitting: Emergency Medicine

## 2022-04-25 ENCOUNTER — Emergency Department (HOSPITAL_COMMUNITY): Payer: Medicare HMO

## 2022-04-25 ENCOUNTER — Other Ambulatory Visit: Payer: Self-pay

## 2022-04-25 DIAGNOSIS — R21 Rash and other nonspecific skin eruption: Secondary | ICD-10-CM

## 2022-04-25 DIAGNOSIS — I10 Essential (primary) hypertension: Secondary | ICD-10-CM | POA: Insufficient documentation

## 2022-04-25 DIAGNOSIS — R0789 Other chest pain: Secondary | ICD-10-CM | POA: Diagnosis not present

## 2022-04-25 DIAGNOSIS — Z79899 Other long term (current) drug therapy: Secondary | ICD-10-CM | POA: Insufficient documentation

## 2022-04-25 DIAGNOSIS — R079 Chest pain, unspecified: Secondary | ICD-10-CM | POA: Diagnosis not present

## 2022-04-25 DIAGNOSIS — L03313 Cellulitis of chest wall: Secondary | ICD-10-CM | POA: Insufficient documentation

## 2022-04-25 LAB — COMPREHENSIVE METABOLIC PANEL
ALT: 16 U/L (ref 0–44)
AST: 21 U/L (ref 15–41)
Albumin: 3.9 g/dL (ref 3.5–5.0)
Alkaline Phosphatase: 58 U/L (ref 38–126)
Anion gap: 10 (ref 5–15)
BUN: 13 mg/dL (ref 8–23)
CO2: 24 mmol/L (ref 22–32)
Calcium: 9.5 mg/dL (ref 8.9–10.3)
Chloride: 103 mmol/L (ref 98–111)
Creatinine, Ser: 0.9 mg/dL (ref 0.44–1.00)
GFR, Estimated: >60 mL/min (ref >60)
Glucose, Bld: 97 mg/dL (ref 70–99)
Potassium: 3.9 mmol/L (ref 3.5–5.1)
Sodium: 137 mmol/L (ref 135–145)
Total Bilirubin: 0.7 mg/dL (ref 0.3–1.2)
Total Protein: 6.6 g/dL (ref 6.5–8.1)

## 2022-04-25 LAB — CBC WITH DIFFERENTIAL/PLATELET
Abs Immature Granulocytes: 0.02 10*3/uL (ref 0.00–0.07)
Basophils Absolute: 0 10*3/uL (ref 0.0–0.1)
Basophils Relative: 1 %
Eosinophils Absolute: 0.2 10*3/uL (ref 0.0–0.5)
Eosinophils Relative: 3 %
HCT: 41.5 % (ref 36.0–46.0)
Hemoglobin: 13.5 g/dL (ref 12.0–15.0)
Immature Granulocytes: 0 %
Lymphocytes Relative: 23 %
Lymphs Abs: 1.7 10*3/uL (ref 0.7–4.0)
MCH: 29.9 pg (ref 26.0–34.0)
MCHC: 32.5 g/dL (ref 30.0–36.0)
MCV: 92 fL (ref 80.0–100.0)
Monocytes Absolute: 0.4 10*3/uL (ref 0.1–1.0)
Monocytes Relative: 6 %
Neutro Abs: 4.8 10*3/uL (ref 1.7–7.7)
Neutrophils Relative %: 67 %
Platelets: 202 10*3/uL (ref 150–400)
RBC: 4.51 MIL/uL (ref 3.87–5.11)
RDW: 12.7 % (ref 11.5–15.5)
WBC: 7.1 10*3/uL (ref 4.0–10.5)
nRBC: 0 % (ref 0.0–0.2)

## 2022-04-25 LAB — TROPONIN I (HIGH SENSITIVITY): Troponin I (High Sensitivity): 4 ng/L (ref <18)

## 2022-04-25 MED ORDER — CEPHALEXIN 500 MG PO CAPS: 500.0000 mg | ORAL_CAPSULE | Freq: Four times a day (QID) | ORAL | 0 refills | Status: DC

## 2022-04-25 NOTE — Discharge Instructions (Addendum)
A barrier cream such as a diaper ointment may help below the left breast.  There may be a cellulitis on it to and some antibiotics been given.  The chest pain does not appear to be coming from her heart.

## 2022-04-25 NOTE — ED Provider Notes (Signed)
[facility] Provider Note   CSN: [id number] Arrival date & time: 04/25/22  1526     History  Chief Complaint  Patient presents with   Rash    Vanessa Fuentes is a 77 y.o. female.   Rash Patient presents with chest pain.  Anterior chest.  Has had 3 or 4 episodes today.  Goes on the left side and the right side of the lower chest.  Also has a rash under her breast.  Reported that was been there for a while.  No fevers or chills.  No coughing.  No shortness of breath.    Past Medical History:  Diagnosis Date   Brachial neuritis or radiculitis NOS    Cervicalgia    Degeneration of cervical intervertebral disc    Degeneration of lumbar or lumbosacral intervertebral disc    Dysfunction of eustachian tube    Memory loss    Osteoporosis 01/24/2015   Spinal stenosis in cervical region    Unspecified essential hypertension     Home Medications Prior to Admission medications   Medication Sig Start Date End Date Taking? Authorizing Provider  cephALEXin (KEFLEX) 500 MG capsule Take 1 capsule (500 mg total) by mouth 4 (four) times daily. 04/25/22  Yes Davonna Belling, MD  alendronate (FOSAMAX) 70 MG tablet Take 1 tablet (70 mg total) by mouth once a week. Take with a full glass of water on an empty stomach. 10/04/20   Janith Lima, MD  Cholecalciferol (VITAMIN D-3 PO) Take 1,000 Units by mouth daily.    [provider]  memantine (NAMENDA) 10 MG tablet Take 1 tablet (10 mg total) by mouth 2 (two) times daily. 05/24/21   Penumalli, Earlean Polka, MD  Misc Natural Products (NEURIVA PO) Take 1 tablet by mouth daily.    [provider]  omeprazole (PRILOSEC) 40 MG capsule Take 1 capsule (40 mg total) by mouth daily. 04/06/22   Janith Lima, MD  rosuvastatin (CRESTOR) 5 MG tablet Take 1 tablet (5 mg total) by mouth at bedtime. 11/25/21   Janith Lima, MD  valsartan (DIOVAN) 160 MG tablet TAKE 1 TABLET BY MOUTH EVERY DAY 04/08/22    Janith Lima, MD      Allergies    Sulfonamide derivatives    Review of Systems   Review of Systems  Skin:  Positive for rash.    Physical Exam Updated Vital Signs BP (!) 153/76   Pulse 72   Temp 98 F (36.7 C) (Oral)   Resp 12   Ht 5\' 5"  (1.651 m)   SpO2 100%   BMI 27.29 kg/m  Physical Exam Vitals and nursing note reviewed.  Cardiovascular:     Rate and Rhythm: Regular rhythm.  Pulmonary:     Breath sounds: No wheezing.     Comments: Tenderness to the lower anterior chest wall bilaterally. Chest:     Chest wall: Tenderness present.  Abdominal:     Tenderness: There is no abdominal tenderness.  Musculoskeletal:     Cervical back: Neck supple.  Neurological:     Mental Status: She is alert.     ED Results / Procedures / Treatments   Labs (all labs ordered are listed, but only abnormal results are displayed) Labs Reviewed  COMPREHENSIVE METABOLIC PANEL  CBC WITH DIFFERENTIAL/PLATELET  URINALYSIS, ROUTINE W REFLEX MICROSCOPIC  TROPONIN I (HIGH SENSITIVITY)  TROPONIN I (HIGH SENSITIVITY)    EKG EKG Interpretation  Date/Time:  Tuesday April 25 2022 15:37:50 EDT Ventricular Rate:  75 PR Interval:  134 QRS Duration: 68 QT Interval:  358 QTC Calculation: 399 R Axis:   -28 Text Interpretation: Normal sinus rhythm Possible Anterior infarct , age undetermined Abnormal ECG When compared with ECG of 26-Apr-2009 20:39, No significant change since last tracing Confirmed by Davonna Belling [phone]) on 04/25/2022 6:35:53 PM  Radiology DG Chest Portable 1 View  Result Date: 04/25/2022 CLINICAL DATA:  Chest pain, left breast rash EXAM: PORTABLE CHEST 1 VIEW COMPARISON:  10/30/2016 FINDINGS: Single frontal view of the chest demonstrates an unremarkable cardiac silhouette. No airspace disease, effusion, or pneumothorax. No acute bony abnormality. IMPRESSION: 1. No acute intrathoracic process. Electronically Signed   By: Randa Ngo M.D.   On: 04/25/2022 17:38     Procedures Procedures    Medications Ordered in ED Medications - No data to display  ED Course/ Medical Decision Making/ A&P                             Medical Decision Making Amount and/or Complexity of Data Reviewed Labs: ordered. Radiology: ordered.  Risk Prescription drug management.   Patient with chest pain.  Anterior chest.  Somewhat difficulty getting history due to memory issues.  However does have recurrent pain on the lower chest.  Is on both left side and right side.  Reportedly does have a rash below the breast.  On exam with chaperone does have some erythema below both breasts.  Particular on left.  No active drainage.  Worse erythema on the left side.  Potentially this erythema is the pain, however pain has come and gone and not necessarily reproducible with palpation on the rash.  Will check cardiac workup due to some of the memory issues but I think should be able to discharge home.  Likely has component of cellulitis and potential yeast.  Workup reassuring.  Troponin negative.  Will treat as infection.  Discharge home.        Final Clinical Impression(s) / ED Diagnoses Final diagnoses:  Rash  Cellulitis of chest wall  Nonspecific chest pain    Rx / DC Orders ED Discharge Orders          Ordered    cephALEXin (KEFLEX) 500 MG capsule  4 times daily        04/25/22 1847              Davonna Belling, MD 04/25/22 2233

## 2022-04-25 NOTE — ED Triage Notes (Signed)
Pt c/o painful rash under left breast starting today. Denies any new changes with lotions or detergents. Denies fevers at home.

## 2022-05-19 ENCOUNTER — Other Ambulatory Visit: Payer: Self-pay | Admitting: Internal Medicine

## 2022-05-19 DIAGNOSIS — I10 Essential (primary) hypertension: Secondary | ICD-10-CM

## 2022-06-01 ENCOUNTER — Telehealth: Payer: Self-pay | Admitting: Internal Medicine

## 2022-06-01 NOTE — Telephone Encounter (Signed)
Prescription Request  06/01/2022  LOV: 10/03/2021  What is the name of the medication or equipment? valsartan (DIOVAN) 160 MG tablet   Have you contacted your pharmacy to request a refill? No   Which pharmacy would you like this sent to?  [facility] [id number] [place], [place] - [address] [place] [address] Phone: [phone] Fax: [phone]    Patient notified that their request is being sent to the clinical staff for review and that they should receive a response within 2 business days.   Please advise at Mobile [phone] (mobile)    Next OV is 06/05/2022.

## 2022-06-05 ENCOUNTER — Ambulatory Visit (INDEPENDENT_AMBULATORY_CARE_PROVIDER_SITE_OTHER): Payer: PRIVATE HEALTH INSURANCE | Admitting: Internal Medicine

## 2022-06-05 ENCOUNTER — Encounter: Payer: Self-pay | Admitting: Internal Medicine

## 2022-06-05 VITALS — BP 136/80 | HR 88 | Temp 98.8°F | Ht 65.0 in | Wt 167.0 lb

## 2022-06-05 DIAGNOSIS — E2839 Other primary ovarian failure: Secondary | ICD-10-CM | POA: Diagnosis not present

## 2022-06-05 DIAGNOSIS — M810 Age-related osteoporosis without current pathological fracture: Secondary | ICD-10-CM | POA: Diagnosis not present

## 2022-06-05 DIAGNOSIS — I10 Essential (primary) hypertension: Secondary | ICD-10-CM | POA: Diagnosis not present

## 2022-06-05 DIAGNOSIS — E785 Hyperlipidemia, unspecified: Secondary | ICD-10-CM

## 2022-06-05 MED ORDER — ROSUVASTATIN CALCIUM 5 MG PO TABS
5.0000 mg | ORAL_TABLET | Freq: Every day | ORAL | 1 refills | Status: DC
Start: 2022-06-05 — End: 2022-11-30

## 2022-06-05 MED ORDER — VALSARTAN 160 MG PO TABS
ORAL_TABLET | ORAL | 1 refills | Status: DC
Start: 2022-06-05 — End: 2022-11-27

## 2022-06-05 NOTE — Patient Instructions (Signed)
Hypertension, Adult High blood pressure (hypertension) is when the force of blood pumping through the arteries is too strong. The arteries are the blood vessels that carry blood from the heart throughout the body. Hypertension forces the heart to work harder to pump blood and may cause arteries to become narrow or stiff. Untreated or uncontrolled hypertension can lead to a heart attack, heart failure, a stroke, kidney disease, and other problems. A blood pressure reading consists of a higher number over a lower number. Ideally, your blood pressure should be below 120/80. The first ("top") number is called the systolic pressure. It is a measure of the pressure in your arteries as your heart beats. The second ("bottom") number is called the diastolic pressure. It is a measure of the pressure in your arteries as the heart relaxes. What are the causes? The exact cause of this condition is not known. There are some conditions that result in high blood pressure. What increases the risk? Certain factors may make you more likely to develop high blood pressure. Some of these risk factors are under your control, including: Smoking. Not getting enough exercise or physical activity. Being overweight. Having too much fat, sugar, calories, or salt (sodium) in your diet. Drinking too much alcohol. Other risk factors include: Having a personal history of heart disease, diabetes, high cholesterol, or kidney disease. Stress. Having a family history of high blood pressure and high cholesterol. Having obstructive sleep apnea. Age. The risk increases with age. What are the signs or symptoms? High blood pressure may not cause symptoms. Very high blood pressure (hypertensive crisis) may cause: Headache. Fast or irregular heartbeats (palpitations). Shortness of breath. Nosebleed. Nausea and vomiting. Vision changes. Severe chest pain, dizziness, and seizures. How is this diagnosed? This condition is diagnosed by  measuring your blood pressure while you are seated, with your arm resting on a flat surface, your legs uncrossed, and your feet flat on the floor. The cuff of the blood pressure monitor will be placed directly against the skin of your upper arm at the level of your heart. Blood pressure should be measured at least twice using the same arm. Certain conditions can cause a difference in blood pressure between your right and left arms. If you have a high blood pressure reading during one visit or you have normal blood pressure with other risk factors, you may be asked to: Return on a different day to have your blood pressure checked again. Monitor your blood pressure at home for 1 week or longer. If you are diagnosed with hypertension, you may have other blood or imaging tests to help your health care provider understand your overall risk for other conditions. How is this treated? This condition is treated by making healthy lifestyle changes, such as eating healthy foods, exercising more, and reducing your alcohol intake. You may be referred for counseling on a healthy diet and physical activity. Your health care provider may prescribe medicine if lifestyle changes are not enough to get your blood pressure under control and if: Your systolic blood pressure is above 130. Your diastolic blood pressure is above 80. Your personal target blood pressure may vary depending on your medical conditions, your age, and other factors. Follow these instructions at home: Eating and drinking  Eat a diet that is high in fiber and potassium, and low in sodium, added sugar, and fat. An example of this eating plan is called the DASH diet. DASH stands for Dietary Approaches to Stop Hypertension. To eat this way: Eat   plenty of fresh fruits and vegetables. Try to fill one half of your plate at each meal with fruits and vegetables. Eat whole grains, such as whole-wheat pasta, brown rice, or whole-grain bread. Fill about one  fourth of your plate with whole grains. Eat or drink low-fat dairy products, such as skim milk or low-fat yogurt. Avoid fatty cuts of meat, processed or cured meats, and poultry with skin. Fill about one fourth of your plate with lean proteins, such as fish, chicken without skin, beans, eggs, or tofu. Avoid pre-made and processed foods. These tend to be higher in sodium, added sugar, and fat. Reduce your daily sodium intake. Many people with hypertension should eat less than 1,500 mg of sodium a day. Do not drink alcohol if: Your health care provider tells you not to drink. You are pregnant, may be pregnant, or are planning to become pregnant. If you drink alcohol: Limit how much you have to: 0-1 drink a day for women. 0-2 drinks a day for men. Know how much alcohol is in your drink. In the [place] one drink equals one 12 oz bottle of beer (355 mL), one 5 oz glass of wine (148 mL), or one 1 oz glass of hard liquor (44 mL). Lifestyle  Work with your health care provider to maintain a healthy body weight or to lose weight. Ask what an ideal weight is for you. Get at least 30 minutes of exercise that causes your heart to beat faster (aerobic exercise) most days of the week. Activities may include walking, swimming, or biking. Include exercise to strengthen your muscles (resistance exercise), such as Pilates or lifting weights, as part of your weekly exercise routine. Try to do these types of exercises for 30 minutes at least 3 days a week. Do not use any products that contain nicotine or tobacco. These products include cigarettes, chewing tobacco, and vaping devices, such as e-cigarettes. If you need help quitting, ask your health care provider. Monitor your blood pressure at home as told by your health care provider. Keep all follow-up visits. This is important. Medicines Take over-the-counter and prescription medicines only as told by your health care provider. Follow directions carefully. Blood  pressure medicines must be taken as prescribed. Do not skip doses of blood pressure medicine. Doing this puts you at risk for problems and can make the medicine less effective. Ask your health care provider about side effects or reactions to medicines that you should watch for. Contact a health care provider if you: Think you are having a reaction to a medicine you are taking. Have headaches that keep coming back (recurring). Feel dizzy. Have swelling in your ankles. Have trouble with your vision. Get help right away if you: Develop a severe headache or confusion. Have unusual weakness or numbness. Feel faint. Have severe pain in your chest or abdomen. Vomit repeatedly. Have trouble breathing. These symptoms may be an emergency. Get help right away. Call 911. Do not wait to see if the symptoms will go away. Do not drive yourself to the hospital. Summary Hypertension is when the force of blood pumping through your arteries is too strong. If this condition is not controlled, it may put you at risk for serious complications. Your personal target blood pressure may vary depending on your medical conditions, your age, and other factors. For most people, a normal blood pressure is less than 120/80. Hypertension is treated with lifestyle changes, medicines, or a combination of both. Lifestyle changes include losing weight, eating a healthy,   low-sodium diet, exercising more, and limiting alcohol. This information is not intended to replace advice given to you by your health care provider. Make sure you discuss any questions you have with your health care provider. Document Revised: 11/16/2020 Document Reviewed: 11/16/2020 Elsevier Patient Education  2023 Elsevier Inc.  

## 2022-06-05 NOTE — Progress Notes (Signed)
Subjective:  Patient ID: Vanessa Fuentes, female    DOB: 1945-06-01  Age: 77 y.o. MRN: [id number]  CC: Hypertension and Hyperlipidemia   HPI CHARMIKA MERVINE presents for f/up ---  She is active and denies chest pain, shortness of breath, diaphoresis, or edema.  Outpatient Medications Prior to Visit  Medication Sig Dispense Refill   Cholecalciferol (VITAMIN D-3 PO) Take 1,000 Units by mouth daily.     memantine (NAMENDA) 10 MG tablet Take 1 tablet (10 mg total) by mouth 2 (two) times daily. 60 tablet 12   Misc Natural Products (NEURIVA PO) Take 1 tablet by mouth daily.     omeprazole (PRILOSEC) 40 MG capsule Take 1 capsule (40 mg total) by mouth daily. 90 capsule 0   alendronate (FOSAMAX) 70 MG tablet Take 1 tablet (70 mg total) by mouth once a week. Take with a full glass of water on an empty stomach. 12 tablet 0   cephALEXin (KEFLEX) 500 MG capsule Take 1 capsule (500 mg total) by mouth 4 (four) times daily. 28 capsule 0   rosuvastatin (CRESTOR) 5 MG tablet Take 1 tablet (5 mg total) by mouth at bedtime. 90 tablet 1   valsartan (DIOVAN) 160 MG tablet TAKE 1 TABLET BY MOUTH DAILY ** NEEDS TO MAKE AN APPOINTMENT FOR FUTURE REFILLS ** 30 tablet 0   No facility-administered medications prior to visit.    ROS Review of Systems  Constitutional: Negative.  Negative for diaphoresis and fatigue.  HENT: Negative.    Eyes: Negative.   Respiratory:  Negative for cough, chest tightness, shortness of breath and wheezing.   Cardiovascular:  Negative for chest pain, palpitations and leg swelling.  Gastrointestinal:  Negative for abdominal pain, constipation, diarrhea, nausea and vomiting.  Endocrine: Negative.   Genitourinary: Negative.  Negative for difficulty urinating.  Musculoskeletal: Negative.  Negative for arthralgias and myalgias.  Skin: Negative.  Negative for color change, pallor and rash.  Neurological:  Negative for dizziness, weakness and light-headedness.  Hematological:   Negative for adenopathy. Does not bruise/bleed easily.  Psychiatric/Behavioral:  Positive for confusion and decreased concentration. Negative for sleep disturbance and suicidal ideas. The patient is not nervous/anxious.     Objective:  BP 136/80 (BP Location: Left Arm, Patient Position: Sitting, Cuff Size: Large)   Pulse 88   Temp 98.8 F (37.1 C) (Oral)   Ht 5\' 5"  (1.651 m)   Wt 167 lb (75.8 kg)   SpO2 97%   BMI 27.79 kg/m   BP Readings from Last 3 Encounters:  06/05/22 136/80  04/25/22 (!) 153/76  10/03/21 126/82    Wt Readings from Last 3 Encounters:  06/05/22 167 lb (75.8 kg)  10/03/21 164 lb (74.4 kg)  05/24/21 163 lb (73.9 kg)    Physical Exam Vitals reviewed.  HENT:     Nose: Nose normal.     Mouth/Throat:     Mouth: Mucous membranes are moist.  Eyes:     General: No scleral icterus.    Conjunctiva/sclera: Conjunctivae normal.  Cardiovascular:     Rate and Rhythm: Normal rate and regular rhythm.     Heart sounds: No murmur heard. Pulmonary:     Effort: Pulmonary effort is normal.     Breath sounds: No stridor. No wheezing, rhonchi or rales.  Abdominal:     General: Abdomen is flat.     Palpations: There is no mass.     Tenderness: There is no abdominal tenderness. There is no guarding.  Hernia: No hernia is present.  Musculoskeletal:        General: Normal range of motion.     Cervical back: Neck supple.     Right lower leg: No edema.     Left lower leg: No edema.  Lymphadenopathy:     Cervical: No cervical adenopathy.  Skin:    General: Skin is warm and dry.  Neurological:     General: No focal deficit present.     Mental Status: She is alert. Mental status is at baseline.  Psychiatric:        Mood and Affect: Mood normal.        Behavior: Behavior normal.     Lab Results  Component Value Date   WBC 7.1 04/25/2022   HGB 13.5 04/25/2022   HCT 41.5 04/25/2022   PLT 202 04/25/2022   GLUCOSE 97 04/25/2022   CHOL 192 10/03/2021   TRIG  76.0 10/03/2021   HDL 59.20 10/03/2021   LDLDIRECT 148.3 04/13/2011   LDLCALC 118 (H) 10/03/2021   ALT 16 04/25/2022   AST 21 04/25/2022   NA 137 04/25/2022   K 3.9 04/25/2022   CL 103 04/25/2022   CREATININE 0.90 04/25/2022   BUN 13 04/25/2022   CO2 24 04/25/2022   TSH 0.97 06/16/2020   HGBA1C 5.0 08/10/2015    DG Chest Portable 1 View  Result Date: 04/25/2022 CLINICAL DATA:  Chest pain, left breast rash EXAM: PORTABLE CHEST 1 VIEW COMPARISON:  10/30/2016 FINDINGS: Single frontal view of the chest demonstrates an unremarkable cardiac silhouette. No airspace disease, effusion, or pneumothorax. No acute bony abnormality. IMPRESSION: 1. No acute intrathoracic process. Electronically Signed   By: Sharlet Salina M.D.   On: 04/25/2022 17:38    Assessment & Plan:   Estrogen deficiency -     DG Bone Density; Future  Osteoporosis, unspecified osteoporosis type, unspecified pathological fracture presence -     DG Bone Density; Future  Hyperlipidemia with target LDL less than 130- LDL goal achieved. Doing well on the statin  -     Rosuvastatin Calcium; Take 1 tablet (5 mg total) by mouth at bedtime.  Dispense: 90 tablet; Refill: 1  Essential hypertension- Her blood pressure is adequately well-controlled.  Recent labs were normal. -     Valsartan; TAKE 1 TABLET BY MOUTH DAILY  Dispense: 90 tablet; Refill: 1     Follow-up: Return in about 6 months (around 12/06/2022).  Sanda Linger, MD

## 2022-06-13 ENCOUNTER — Other Ambulatory Visit: Payer: Self-pay | Admitting: Internal Medicine

## 2022-06-13 DIAGNOSIS — K2100 Gastro-esophageal reflux disease with esophagitis, without bleeding: Secondary | ICD-10-CM

## 2022-06-18 ENCOUNTER — Other Ambulatory Visit: Payer: Self-pay | Admitting: Diagnostic Neuroimaging

## 2022-06-26 ENCOUNTER — Telehealth: Payer: Self-pay | Admitting: Internal Medicine

## 2022-06-26 NOTE — Telephone Encounter (Signed)
Prescription Request  06/26/2022  LOV: 06/05/2022  What is the name of the medication or equipment?  valsartan (DIOVAN) 160 MG tablet  Have you contacted your pharmacy to request a refill? Yes   Which pharmacy would you like this sent to?  [facility] [id number] [place], [place] - [address] [place] [address] Phone: [phone] Fax: [phone]    Patient notified that their request is being sent to the clinical staff for review and that they should receive a response within 2 business days.   Please advise at Mobile [phone] (mobile)

## 2022-06-28 DIAGNOSIS — H43822 Vitreomacular adhesion, left eye: Secondary | ICD-10-CM | POA: Diagnosis not present

## 2022-06-28 DIAGNOSIS — H43812 Vitreous degeneration, left eye: Secondary | ICD-10-CM | POA: Diagnosis not present

## 2022-06-28 DIAGNOSIS — H35363 Drusen (degenerative) of macula, bilateral: Secondary | ICD-10-CM | POA: Diagnosis not present

## 2022-07-08 ENCOUNTER — Other Ambulatory Visit: Payer: Self-pay | Admitting: Diagnostic Neuroimaging

## 2022-07-10 ENCOUNTER — Telehealth: Payer: Self-pay | Admitting: Internal Medicine

## 2022-07-10 ENCOUNTER — Other Ambulatory Visit: Payer: Self-pay | Admitting: Internal Medicine

## 2022-07-10 DIAGNOSIS — F01B0 Vascular dementia, moderate, without behavioral disturbance, psychotic disturbance, mood disturbance, and anxiety: Secondary | ICD-10-CM

## 2022-07-10 MED ORDER — MEMANTINE HCL 10 MG PO TABS: 10.0000 mg | ORAL_TABLET | Freq: Two times a day (BID) | ORAL | 5 refills | Status: DC

## 2022-07-10 NOTE — Telephone Encounter (Signed)
Prescription Request  07/10/2022  LOV: 06/05/2022  What is the name of the medication or equipment? memantine  Have you contacted your pharmacy to request a refill? Yes   Which pharmacy would you like this sent to?  [facility] [id number] [place], [place] - [address] [place] [address] Phone: [phone] Fax: [phone]   Patient notified that their request is being sent to the clinical staff for review and that they should receive a response within 2 business days.   Please advise at Mobile [phone] (mobile)

## 2022-08-02 ENCOUNTER — Other Ambulatory Visit: Payer: Self-pay | Admitting: Internal Medicine

## 2022-08-02 DIAGNOSIS — K2100 Gastro-esophageal reflux disease with esophagitis, without bleeding: Secondary | ICD-10-CM

## 2022-08-11 ENCOUNTER — Telehealth: Payer: Self-pay | Admitting: *Deleted

## 2022-08-11 NOTE — Telephone Encounter (Signed)
I connected with Vanessa Fuentes on 08/11/2022 at 1045 by telephone and verified that I am speaking with the correct person using two identifiers. According to the patient's chart they are due for follow up with [facility]. Pt scheduled. There are no transportation issues at this time. Nothing further was needed at the end of our conversation.

## 2022-09-13 DIAGNOSIS — Z1231 Encounter for screening mammogram for malignant neoplasm of breast: Secondary | ICD-10-CM | POA: Diagnosis not present

## 2022-09-13 LAB — HM MAMMOGRAPHY

## 2022-10-11 DIAGNOSIS — Z008 Encounter for other general examination: Secondary | ICD-10-CM | POA: Diagnosis not present

## 2022-11-02 DIAGNOSIS — L821 Other seborrheic keratosis: Secondary | ICD-10-CM | POA: Diagnosis not present

## 2022-11-02 DIAGNOSIS — D2262 Melanocytic nevi of left upper limb, including shoulder: Secondary | ICD-10-CM | POA: Diagnosis not present

## 2022-11-02 DIAGNOSIS — D225 Melanocytic nevi of trunk: Secondary | ICD-10-CM | POA: Diagnosis not present

## 2022-11-10 ENCOUNTER — Other Ambulatory Visit: Payer: Self-pay | Admitting: Internal Medicine

## 2022-11-10 DIAGNOSIS — K2100 Gastro-esophageal reflux disease with esophagitis, without bleeding: Secondary | ICD-10-CM

## 2022-11-27 ENCOUNTER — Other Ambulatory Visit: Payer: Self-pay

## 2022-11-27 ENCOUNTER — Telehealth: Payer: Self-pay | Admitting: Internal Medicine

## 2022-11-27 DIAGNOSIS — I10 Essential (primary) hypertension: Secondary | ICD-10-CM

## 2022-11-27 MED ORDER — VALSARTAN 160 MG PO TABS
ORAL_TABLET | ORAL | 0 refills | Status: DC
Start: 2022-11-27 — End: 2022-12-19

## 2022-11-27 NOTE — Telephone Encounter (Signed)
Prescription Request  11/27/2022  LOV: 06/05/2022  What is the name of the medication or equipment?  valsartan (DIOVAN) 160 MG tablet [   Have you contacted your pharmacy to request a refill? No   Which pharmacy would you like this sent to?    [facility] [id number] [place], [place] - [address] [place] [address] Phone: [phone] Fax: [phone]  Patient notified that their request is being sent to the clinical staff for review and that they should receive a response within 2 business days.   Please advise at Mobile [phone] (mobile)

## 2022-11-27 NOTE — Telephone Encounter (Signed)
 Medication refill sent to Dr. Yetta Barre

## 2022-11-30 ENCOUNTER — Other Ambulatory Visit: Payer: Self-pay | Admitting: Internal Medicine

## 2022-11-30 DIAGNOSIS — E785 Hyperlipidemia, unspecified: Secondary | ICD-10-CM

## 2022-12-07 ENCOUNTER — Encounter: Payer: Self-pay | Admitting: Internal Medicine

## 2022-12-07 ENCOUNTER — Ambulatory Visit (INDEPENDENT_AMBULATORY_CARE_PROVIDER_SITE_OTHER): Payer: Medicare HMO | Admitting: Internal Medicine

## 2022-12-07 VITALS — BP 142/84 | HR 77 | Temp 97.9°F | Resp 16 | Ht 65.0 in | Wt 164.4 lb

## 2022-12-07 DIAGNOSIS — Z0001 Encounter for general adult medical examination with abnormal findings: Secondary | ICD-10-CM | POA: Insufficient documentation

## 2022-12-07 DIAGNOSIS — Z0000 Encounter for general adult medical examination without abnormal findings: Secondary | ICD-10-CM | POA: Diagnosis not present

## 2022-12-07 DIAGNOSIS — K2100 Gastro-esophageal reflux disease with esophagitis, without bleeding: Secondary | ICD-10-CM | POA: Diagnosis not present

## 2022-12-07 DIAGNOSIS — E785 Hyperlipidemia, unspecified: Secondary | ICD-10-CM | POA: Diagnosis not present

## 2022-12-07 DIAGNOSIS — I491 Atrial premature depolarization: Secondary | ICD-10-CM

## 2022-12-07 DIAGNOSIS — I10 Essential (primary) hypertension: Secondary | ICD-10-CM

## 2022-12-07 DIAGNOSIS — N1831 Chronic kidney disease, stage 3a: Secondary | ICD-10-CM | POA: Diagnosis not present

## 2022-12-07 NOTE — Progress Notes (Unsigned)
Subjective:  Patient ID: Vanessa Fuentes, female    DOB: January 27, 1945  Age: 77 y.o. MRN: [id number]  CC: Hypertension, Hyperlipidemia, and Annual Exam   HPI [name] presents for a CPX and f/up ---  Discussed the use of AI scribe software for clinical note transcription with the patient, who gave verbal consent to proceed.  History of Present Illness   She denies any subjective symptoms of palpitations, dizziness, lightheadedness, chest pain, or shortness of breath. In fact, she reports feeling well and has been doing 'pretty good.'  She is currently on Crestor, with no reported side effects such as muscle or joint aches, heartburn, or indigestion.       Outpatient Medications Prior to Visit  Medication Sig Dispense Refill   Cholecalciferol (VITAMIN D-3 PO) Take 1,000 Units by mouth daily.     memantine (NAMENDA) 10 MG tablet Take 1 tablet (10 mg total) by mouth 2 (two) times daily. 60 tablet 5   Misc Natural Products (NEURIVA PO) Take 1 tablet by mouth daily.     omeprazole (PRILOSEC) 40 MG capsule TAKE 1 CAPSULE BY MOUTH DAILY 90 capsule 0   rosuvastatin (CRESTOR) 5 MG tablet TAKE 1 TABLET BY MOUTH AT BEDTIME 90 tablet 0   valsartan (DIOVAN) 160 MG tablet TAKE 1 TABLET BY MOUTH DAILY 90 tablet 0   No facility-administered medications prior to visit.    ROS Review of Systems  Constitutional: Negative.  Negative for diaphoresis and fatigue.  HENT: Negative.    Eyes: Negative.  Negative for visual disturbance.  Respiratory:  Negative for cough, chest tightness, shortness of breath and wheezing.   Cardiovascular:  Negative for chest pain, palpitations and leg swelling.  Gastrointestinal: Negative.  Negative for abdominal pain, diarrhea, nausea and vomiting.  Endocrine: Negative.   Genitourinary: Negative.  Negative for difficulty urinating.  Musculoskeletal: Negative.  Negative for arthralgias, joint swelling and myalgias.  Skin: Negative.   Neurological:  Negative  for dizziness, weakness and light-headedness.  Hematological:  Negative for adenopathy. Does not bruise/bleed easily.  Psychiatric/Behavioral:  Positive for confusion and decreased concentration. Negative for behavioral problems and self-injury. The patient is not nervous/anxious.     Objective:  BP (!) 142/84   Pulse 77   Temp 97.9 F (36.6 C) (Oral)   Resp 16   Ht 5\' 5"  (1.651 m)   Wt 164 lb 6.4 oz (74.6 kg)   SpO2 97%   BMI 27.36 kg/m   BP Readings from Last 3 Encounters:  12/07/22 (!) 142/84  06/05/22 136/80  04/25/22 (!) 153/76    Wt Readings from Last 3 Encounters:  12/07/22 164 lb 6.4 oz (74.6 kg)  06/05/22 167 lb (75.8 kg)  10/03/21 164 lb (74.4 kg)    Physical Exam Vitals reviewed.  Constitutional:      Appearance: Normal appearance.  HENT:     Nose: Nose normal.     Mouth/Throat:     Mouth: Mucous membranes are moist.  Eyes:     General: No scleral icterus.    Conjunctiva/sclera: Conjunctivae normal.  Cardiovascular:     Rate and Rhythm: Normal rate and regular rhythm. Frequent Extrasystoles are present.    Heart sounds: No murmur heard.    No friction rub. No gallop.     Comments: EKG--- SR with PAC's with aberrant conduction, 81 bpm LAD and anterior infarct pattern are not new No acute ST/T wave changes Pulmonary:     Effort: Pulmonary effort is normal.  Breath sounds: No stridor. No wheezing, rhonchi or rales.  Abdominal:     General: Abdomen is flat.     Palpations: There is no mass.     Tenderness: There is no abdominal tenderness. There is no guarding.     Hernia: No hernia is present.  Musculoskeletal:        General: Normal range of motion.     Right lower leg: No edema.     Left lower leg: No edema.  Skin:    General: Skin is warm and dry.     Findings: No rash.  Neurological:     General: No focal deficit present.     Mental Status: She is alert. Mental status is at baseline.  Psychiatric:        Mood and Affect: Mood normal.         Behavior: Behavior normal.     Lab Results  Component Value Date   WBC 7.0 12/08/2022   HGB 13.6 12/08/2022   HCT 41.1 12/08/2022   PLT 199.0 12/08/2022   GLUCOSE 91 12/08/2022   CHOL 147 12/08/2022   TRIG 83.0 12/08/2022   HDL 55.40 12/08/2022   LDLDIRECT 148.3 04/13/2011   LDLCALC 75 12/08/2022   ALT 13 12/08/2022   AST 17 12/08/2022   NA 140 12/08/2022   K 4.1 12/08/2022   CL 104 12/08/2022   CREATININE 0.99 12/08/2022   BUN 14 12/08/2022   CO2 30 12/08/2022   TSH 1.00 12/08/2022   HGBA1C 5.0 08/10/2015    DG Chest Portable 1 View  Result Date: 04/25/2022 CLINICAL DATA:  Chest pain, left breast rash EXAM: PORTABLE CHEST 1 VIEW COMPARISON:  10/30/2016 FINDINGS: Single frontal view of the chest demonstrates an unremarkable cardiac silhouette. No airspace disease, effusion, or pneumothorax. No acute bony abnormality. IMPRESSION: 1. No acute intrathoracic process. Electronically Signed   By: Sharlet Salina M.D.   On: 04/25/2022 17:38    Assessment & Plan:   Essential hypertension- Her BP is adequately well controlled. -     Basic metabolic panel; Future -     TSH; Future -     Hepatic function panel; Future -     EKG 12-Lead  Premature atrial complexes- She is asx with this. Labs are negative for secondary causes. -     TSH; Future  Hyperlipidemia with target LDL less than 130 - LDL goal achieved. Doing well on the statin  -     Lipid panel; Future -     TSH; Future -     Hepatic function panel; Future  Gastroesophageal reflux disease with esophagitis without hemorrhage- Symptoms are well controlled. -     CBC with Differential/Platelet; Future  Encounter for general adult medical examination with abnormal findings - Exam completed, labs reviewed, vaccines reviewed, no cancer screenings indicated, pt ed material was given.      Follow-up: Return in about 6 months (around 06/06/2023).  Vanessa Linger, MD

## 2022-12-07 NOTE — Patient Instructions (Signed)
Premature Atrial Contraction  A premature atrial contraction [facility]) is a kind of irregular heartbeat (arrhythmia). The heart has four chambers, including the upper chambers (atria) and lower chambers (ventricles). Normally, an electrical signal starts in a group of cells called the sinoatrial node (SA node) and travels through the atria, causing them to pump blood into the ventricles. During a PAC, the atria beat too early, before they have had time to fill with blood. The heartbeat pauses afterward so the heart can fill with blood for the next beat. Sometimes PAC can be a warning sign of another type of arrhythmia called atrial fibrillation. Atrial fibrillation may allow blood to pool in the atria and form clots. If a clot travels to the brain, it can cause a stroke. What are the causes? The cause of this condition is often unknown. Sometimes, this condition may be caused by heart disease or injury to the heart. What increases the risk? You are more likely to develop this condition if: You have heart disease. You are 40 years of age or older. Episodes may be triggered by: Tobacco, alcohol, or caffeine use. Stimulant drugs. Some medicines or supplements. Stress and anxiety. What are the signs or symptoms? Symptoms of this condition include: The feeling of a pause in the heartbeat. The first heartbeat after the "skipped" beat may feel more forceful. A feeling that your heart is fluttering. A quick feeling of dizziness or faintness. How is this diagnosed? This condition is diagnosed based on: Your medical history or symptoms. A physical exam. Your health care provider may listen to your heart. An ECG (electrocardiogram) to monitor the electrical activity of your heart. An ambulatory cardiac monitor that records your heartbeats for 24 hours or more. You may also have: Blood tests. An echocardiogram, which creates an image of your heart. How is this treated? Treatment depends on how often  your symptoms happen and other risk factors. Treatments may include: Medicines. A catheter ablation procedure to destroy the part of the heart tissue that sends abnormal signals. In many cases, treatment may not be needed. Follow these instructions at home: Lifestyle  Do not use any products that contain nicotine or tobacco. These products include cigarettes, chewing tobacco, and vaping devices, such as e-cigarettes. If you need help quitting, ask your health care provider. Exercise regularly. Ask your health care provider what type of exercise is safe for you. Try to get at least 7-9 hours of sleep each night. Find healthy ways to manage stress. Avoid stressful situations when possible. Alcohol use Do not drink alcohol if: Your health care provider tells you not to drink. You are pregnant, may be pregnant, or are planning to become pregnant. Alcohol triggers your episodes. If you drink alcohol: Limit how much you have to: 0-1 drink a day for women. 0-2 drinks a day for men. Know how much alcohol is in your drink. In the [place] one drink equals one 12 oz bottle of beer (355 mL), one 5 oz glass of wine (148 mL), or one 1 oz glass of hard liquor (44 mL). General instructions Take over-the-counter and prescription medicines only as told by your health care provider. If caffeine triggers episodes, do not eat, drink, or use anything with caffeine in it. Contact a health care provider if: You feel your heart is fluttering. Your heart skips beats and you feel dizzy, light-headed, or very tired. Get help right away if: You have chest pain. You have trouble breathing. You faint. You have any symptoms of  a stroke. "BE FAST" is an easy way to remember the main warning signs of a stroke: B - Balance. Signs are dizziness, sudden trouble walking, or loss of balance. E - Eyes. Signs are trouble seeing or a sudden change in vision. F - Face. Signs are sudden weakness or numbness of the face, or the  face or eyelid drooping on one side. A - Arms. Signs are weakness or numbness in an arm. This happens suddenly and usually on one side of the body. S - Speech. Signs are sudden trouble speaking, slurred speech, or trouble understanding what people say. T - Time. Time to call emergency services. Write down what time symptoms started. You have other signs of stroke, such as: A sudden, severe headache with no known cause. Nausea or vomiting. Seizure. These symptoms may be an emergency. Get help right away. Call 911. Do not wait to see if the symptoms will go away. Do not drive yourself to the hospital. This information is not intended to replace advice given to you by your health care provider. Make sure you discuss any questions you have with your health care provider. Document Revised: 06/10/2021 Document Reviewed: 06/10/2021 Elsevier Patient Education  2024 [employer].

## 2022-12-08 ENCOUNTER — Other Ambulatory Visit (INDEPENDENT_AMBULATORY_CARE_PROVIDER_SITE_OTHER): Payer: PRIVATE HEALTH INSURANCE

## 2022-12-08 DIAGNOSIS — I491 Atrial premature depolarization: Secondary | ICD-10-CM | POA: Diagnosis not present

## 2022-12-08 DIAGNOSIS — K2100 Gastro-esophageal reflux disease with esophagitis, without bleeding: Secondary | ICD-10-CM | POA: Diagnosis not present

## 2022-12-08 DIAGNOSIS — I10 Essential (primary) hypertension: Secondary | ICD-10-CM | POA: Diagnosis not present

## 2022-12-08 DIAGNOSIS — E785 Hyperlipidemia, unspecified: Secondary | ICD-10-CM | POA: Diagnosis not present

## 2022-12-08 LAB — LIPID PANEL
Cholesterol: 147 mg/dL (ref 0–200)
HDL: 55.4 mg/dL (ref >39.00)
LDL Cholesterol: 75 mg/dL (ref 0–99)
NonHDL: 91.86
Total CHOL/HDL Ratio: 3
Triglycerides: 83 mg/dL (ref 0.0–149.0)
VLDL: 16.6 mg/dL (ref 0.0–40.0)

## 2022-12-08 LAB — HEPATIC FUNCTION PANEL
ALT: 13 U/L (ref 0–35)
AST: 17 U/L (ref 0–37)
Albumin: 4 g/dL (ref 3.5–5.2)
Alkaline Phosphatase: 64 U/L (ref 39–117)
Bilirubin, Direct: 0.1 mg/dL (ref 0.0–0.3)
Total Bilirubin: 0.6 mg/dL (ref 0.2–1.2)
Total Protein: 6.4 g/dL (ref 6.0–8.3)

## 2022-12-08 LAB — CBC WITH DIFFERENTIAL/PLATELET
Basophils Absolute: 0.1 10*3/uL (ref 0.0–0.1)
Basophils Relative: 1.1 % (ref 0.0–3.0)
Eosinophils Absolute: 0.2 10*3/uL (ref 0.0–0.7)
Eosinophils Relative: 3.1 % (ref 0.0–5.0)
HCT: 41.1 % (ref 36.0–46.0)
Hemoglobin: 13.6 g/dL (ref 12.0–15.0)
Lymphocytes Relative: 26.5 % (ref 12.0–46.0)
Lymphs Abs: 1.8 10*3/uL (ref 0.7–4.0)
MCHC: 33.1 g/dL (ref 30.0–36.0)
MCV: 90.2 fL (ref 78.0–100.0)
Monocytes Absolute: 0.5 10*3/uL (ref 0.1–1.0)
Monocytes Relative: 6.7 % (ref 3.0–12.0)
Neutro Abs: 4.3 10*3/uL (ref 1.4–7.7)
Neutrophils Relative %: 62.6 % (ref 43.0–77.0)
Platelets: 199 10*3/uL (ref 150.0–400.0)
RBC: 4.56 Mil/uL (ref 3.87–5.11)
RDW: 13.7 % (ref 11.5–15.5)
WBC: 7 10*3/uL (ref 4.0–10.5)

## 2022-12-08 LAB — BASIC METABOLIC PANEL
BUN: 14 mg/dL (ref 6–23)
CO2: 30 meq/L (ref 19–32)
Calcium: 9.3 mg/dL (ref 8.4–10.5)
Chloride: 104 meq/L (ref 96–112)
Creatinine, Ser: 0.99 mg/dL (ref 0.40–1.20)
GFR: 55.24 mL/min — ABNORMAL LOW (ref >60.00)
Glucose, Bld: 91 mg/dL (ref 70–99)
Potassium: 4.1 meq/L (ref 3.5–5.1)
Sodium: 140 meq/L (ref 135–145)

## 2022-12-08 LAB — TSH: TSH: 1 u[IU]/mL (ref 0.35–5.50)

## 2022-12-19 ENCOUNTER — Other Ambulatory Visit: Payer: Self-pay | Admitting: Internal Medicine

## 2022-12-19 DIAGNOSIS — I10 Essential (primary) hypertension: Secondary | ICD-10-CM

## 2022-12-26 ENCOUNTER — Telehealth: Payer: Self-pay | Admitting: Internal Medicine

## 2022-12-26 ENCOUNTER — Other Ambulatory Visit: Payer: Self-pay

## 2022-12-26 DIAGNOSIS — F01B0 Vascular dementia, moderate, without behavioral disturbance, psychotic disturbance, mood disturbance, and anxiety: Secondary | ICD-10-CM

## 2022-12-26 MED ORDER — MEMANTINE HCL 10 MG PO TABS
10.0000 mg | ORAL_TABLET | Freq: Two times a day (BID) | ORAL | 1 refills | Status: DC
Start: 2022-12-26 — End: 2023-06-21

## 2022-12-26 NOTE — Telephone Encounter (Signed)
Prescription Request  12/26/2022  LOV: 12/07/2022  What is the name of the medication or equipment? Memantine  Have you contacted your pharmacy to request a refill? Yes   Which pharmacy would you like this sent to?  [facility] [id number] [place], [place] - [address] [place] [address] Phone: [phone] Fax: [phone]     Patient notified that their request is being sent to the clinical staff for review and that they should receive a response within 2 business days.   Please advise at Mobile [phone] (mobile)

## 2022-12-26 NOTE — Telephone Encounter (Signed)
Medication has been refilled.

## 2023-01-31 ENCOUNTER — Other Ambulatory Visit: Payer: Self-pay

## 2023-01-31 ENCOUNTER — Other Ambulatory Visit: Payer: Self-pay | Admitting: Internal Medicine

## 2023-01-31 ENCOUNTER — Telehealth: Payer: Self-pay | Admitting: Internal Medicine

## 2023-01-31 DIAGNOSIS — K2100 Gastro-esophageal reflux disease with esophagitis, without bleeding: Secondary | ICD-10-CM

## 2023-01-31 MED ORDER — OMEPRAZOLE 40 MG PO CPDR: 40.0000 mg | DELAYED_RELEASE_CAPSULE | Freq: Every day | ORAL | 0 refills | Status: DC

## 2023-01-31 NOTE — Telephone Encounter (Signed)
 Copied from CRM [id number]. Topic: Clinical - Medication Refill >> Jan 31, 2023 11:10 AM Viola FALCON wrote: Most Recent Primary Care Visit:  Provider: [facility]  Department: [facility]  Visit Type: LAB  Date: 12/08/2022  Medication: Omeprazole  (PRILOSEC) 40 MG capsule [[id number]]  Has the patient contacted their pharmacy? No (Agent: If no, request that the patient contact the pharmacy for the refill. If patient does not wish to contact the pharmacy document the reason why and proceed with request.) (Agent: If yes, when and what did the pharmacy advise?)  Is this the correct pharmacy for this prescription? Yes If no, delete pharmacy and type the correct one.  This is the patient's preferred pharmacy:   [facility] [id number] [place], [place] - [address] [place] [address] Phone: [phone] Fax: [phone]    Has the prescription been filled recently? Yes  Is the patient out of the medication? No, 4/5 left   Has the patient been seen for an appointment in the last year OR does the patient have an upcoming appointment? Yes  Can we respond through MyChart? Yes  Agent: Please be advised that Rx refills may take up to 3 business days. We ask that you follow-up with your pharmacy.

## 2023-01-31 NOTE — Telephone Encounter (Signed)
 Medication has been refilled.

## 2023-01-31 NOTE — Telephone Encounter (Signed)
 Copied from CRM [id number]. Topic: Clinical - Medication Refill >> Jan 31, 2023 11:10 AM Viola FALCON wrote: Most Recent Primary Care Visit:  Provider: [facility]  Department: [facility]  Visit Type: LAB  Date: 12/08/2022  Medication: ***  Has the patient contacted their pharmacy?  (Agent: If no, request that the patient contact the pharmacy for the refill. If patient does not wish to contact the pharmacy document the reason why and proceed with request.) (Agent: If yes, when and what did the pharmacy advise?)  Is this the correct pharmacy for this prescription?  If no, delete pharmacy and type the correct one.  This is the patient's preferred pharmacy:  [facility] [id number] [place], [place] - [address] [place] [address] Phone: [phone] Fax: [phone]  [facility] [place], [place] - [address] [place] [address] Phone: [phone] Fax: [phone]   Has the prescription been filled recently?   Is the patient out of the medication?   Has the patient been seen for an appointment in the last year OR does the patient have an upcoming appointment?   Can we respond through MyChart?   Agent: Please be advised that Rx refills may take up to 3 business days. We ask that you follow-up with your pharmacy.

## 2023-02-05 ENCOUNTER — Other Ambulatory Visit: Payer: Self-pay | Admitting: Internal Medicine

## 2023-02-05 DIAGNOSIS — K2100 Gastro-esophageal reflux disease with esophagitis, without bleeding: Secondary | ICD-10-CM

## 2023-02-13 ENCOUNTER — Other Ambulatory Visit: Payer: Self-pay | Admitting: Internal Medicine

## 2023-02-13 DIAGNOSIS — I10 Essential (primary) hypertension: Secondary | ICD-10-CM

## 2023-02-13 MED ORDER — VALSARTAN 160 MG PO TABS
ORAL_TABLET | ORAL | 1 refills | Status: DC
Start: 2023-02-13 — End: 2023-08-14

## 2023-02-13 NOTE — Telephone Encounter (Signed)
Copied from CRM [id number]. Topic: Clinical - Medication Refill >> Feb 13, 2023  8:15 AM Isabell A wrote: Most Recent Primary Care Visit:  Provider: [facility]  Department: [facility]  Visit Type: LAB  Date: 12/08/2022  Medication: valsartan (DIOVAN) 160 MG tablet  Has the patient contacted their pharmacy? Yes (Agent: If no, request that the patient contact the pharmacy for the refill. If patient does not wish to contact the pharmacy document the reason why and proceed with request.) (Agent: If yes, when and what did the pharmacy advise?)  Is this the correct pharmacy for this prescription? Yes If no, delete pharmacy and type the correct one.  This is the patient's preferred pharmacy:  [facility] [id number] [place], [place] - [address] [place] [address] Phone: [phone] Fax: [phone]  Has the prescription been filled recently? Yes  Is the patient out of the medication? No  Has the patient been seen for an appointment in the last year OR does the patient have an upcoming appointment? Yes  Can we respond through MyChart? No  Agent: Please be advised that Rx refills may take up to 3 business days. We ask that you follow-up with your pharmacy.     Requesting 90 day supply.

## 2023-02-21 ENCOUNTER — Other Ambulatory Visit: Payer: Self-pay | Admitting: Internal Medicine

## 2023-02-21 DIAGNOSIS — E785 Hyperlipidemia, unspecified: Secondary | ICD-10-CM

## 2023-03-26 ENCOUNTER — Ambulatory Visit (INDEPENDENT_AMBULATORY_CARE_PROVIDER_SITE_OTHER): Payer: PRIVATE HEALTH INSURANCE | Admitting: Podiatry

## 2023-03-26 ENCOUNTER — Encounter: Payer: Self-pay | Admitting: Podiatry

## 2023-03-26 ENCOUNTER — Ambulatory Visit (INDEPENDENT_AMBULATORY_CARE_PROVIDER_SITE_OTHER): Payer: PRIVATE HEALTH INSURANCE

## 2023-03-26 DIAGNOSIS — M898X9 Other specified disorders of bone, unspecified site: Secondary | ICD-10-CM

## 2023-03-26 DIAGNOSIS — L84 Corns and callosities: Secondary | ICD-10-CM

## 2023-03-28 ENCOUNTER — Telehealth: Payer: Self-pay | Admitting: Podiatry

## 2023-03-28 NOTE — Telephone Encounter (Signed)
 DOS-04/09/23  EXOSTECTOMY [id number]  [employer] EFFECTIVE DATE- 01/24/23  SPOKE WITH SHWANA C FROM [employer] AND SHE STATED THAT PRIOR AUTH IS NOT REQUIRED FOR CPT CODE 78295.  CALL REF #: 03/28/23, [id number]

## 2023-03-28 NOTE — Progress Notes (Signed)
 Subjective:   Patient ID: Vanessa Fuentes, female   DOB: 78 y.o.   MRN: [id number]   HPI Patient presents with caregiver with chronic lesion on the fifth toe right that is been very tender making it hard to wear shoe gear comfortably.  Has tried wider shoes they have tried medicated corn pads without relief and states that it is increasingly sore for over and make it hard to be active for wear shoes   ROS      Objective:  Physical Exam  Neurovascular status intact good digital perfusion noted excess ptotic lesion digit 5 right medial side painful when pressed with inability to wear shoe gear without any degree of relief with history of working on this and using medicated corn pads which are not successful     Assessment:  Chronic excess ptotic area digit 5 right painful     Plan:  H&P reviewed at great length with her and caregiver and recommended distal medial exostectomy which can be done in the office.  I did explain procedure risk they read consent form understanding all risk and everything is outlined and after review they signed consent form and understand she will have several stitches and recovery will probably take several months for complete recovery.  They want to get it done or motivated and patient scheduled outpatient procedure  X-rays indicate that there is small osteochondral spur formation with rotation fifth digit right foot

## 2023-04-09 ENCOUNTER — Ambulatory Visit (INDEPENDENT_AMBULATORY_CARE_PROVIDER_SITE_OTHER): Payer: PRIVATE HEALTH INSURANCE | Admitting: Podiatry

## 2023-04-09 ENCOUNTER — Encounter: Payer: Self-pay | Admitting: Podiatry

## 2023-04-09 DIAGNOSIS — M898X9 Other specified disorders of bone, unspecified site: Secondary | ICD-10-CM

## 2023-04-09 DIAGNOSIS — D1632 Benign neoplasm of short bones of left lower limb: Secondary | ICD-10-CM | POA: Diagnosis not present

## 2023-04-09 NOTE — Progress Notes (Signed)
 Subjective:   Patient ID: Vanessa Fuentes, female   DOB: 78 y.o.   MRN: [id number]   HPI Patient presents with chronic painful lesion fifth digit right foot that makes shoe gear difficult and has not responded conservatively.  Presents for surgical correction   ROS      Objective:  Physical Exam  Neuro vascular status intact keratotic lesion inside fifth digit right painful when pressed with bone spur formation     Assessment:  Chronic keratotic lesion digit 5 right     Plan:  Patient was anesthetized with 60 mg like Marcaine mixture and patient was taken to the OR and sterile prep was done.  I then went ahead and applied Ace wrap and inflated tourniquet to 250 mmHg follow procedure was performed.  Exostectomy digit 5 right attention was directed to the medial side digit 5 right where a semielliptical incision was made over the offending bone structure.  The incision was deepened through subcutaneous tissue down to bone and the intervening skin wedge was removed in toto.  The area was identified and I then utilized a sidecutting bone drill and I drilled the bone where there was the enlarged spur which was removed in toto.  I then went ahead flushed and I sutured with 5-0 nylon.  Sterile dressing applied tourniquet released capillary fill immediate and patient left the OR in satisfactory condition with surgical shoe

## 2023-04-10 ENCOUNTER — Telehealth: Payer: Self-pay | Admitting: Podiatry

## 2023-04-10 NOTE — Telephone Encounter (Signed)
 Per voicemail from pts husband today at 811am. Pt had surgery yesterday with Dr Charlsie Merles and he has some questions . Morrie Sheldon was the Engineer, manufacturing. He would like a call back please.

## 2023-04-10 NOTE — Telephone Encounter (Signed)
 Patient's husband came in the office and I spoke to him. She took all her bandages off by the time he got home yesterday. I told him to re-wrap the toe and make sure she keeps the foot dry and the shoe on

## 2023-04-11 NOTE — Telephone Encounter (Signed)
 That's fine. Can take dressing off by weekend

## 2023-04-19 ENCOUNTER — Encounter: Payer: Self-pay | Admitting: Podiatry

## 2023-04-19 ENCOUNTER — Ambulatory Visit (INDEPENDENT_AMBULATORY_CARE_PROVIDER_SITE_OTHER): Payer: PRIVATE HEALTH INSURANCE | Admitting: Podiatry

## 2023-04-19 ENCOUNTER — Ambulatory Visit (INDEPENDENT_AMBULATORY_CARE_PROVIDER_SITE_OTHER)

## 2023-04-19 DIAGNOSIS — M898X9 Other specified disorders of bone, unspecified site: Secondary | ICD-10-CM

## 2023-04-19 NOTE — Progress Notes (Signed)
 Subjective:   Patient ID: Vanessa Fuentes, female   DOB: 78 y.o.   MRN: [id number]   HPI Patient states doing well with surgery very pleased   ROS      Objective:  Physical Exam  Neurovascular status intact negative Denna Haggard' sign noted digit healing well wound edges well coapted no drainage minimal discomfort     Assessment:  Doing well post exostectomy digit 5 right     Plan:  Stitches removed wound edges coapted well x-ray reviewed may return to soft shoe gear dressing applied to the toe and will be seen back as needed  X-rays indicate satisfactory resection of bone no indication of pathology currently

## 2023-04-26 DIAGNOSIS — Z01419 Encounter for gynecological examination (general) (routine) without abnormal findings: Secondary | ICD-10-CM | POA: Diagnosis not present

## 2023-05-04 ENCOUNTER — Ambulatory Visit (INDEPENDENT_AMBULATORY_CARE_PROVIDER_SITE_OTHER): Payer: PRIVATE HEALTH INSURANCE

## 2023-05-04 VITALS — Ht 66.0 in | Wt 164.0 lb

## 2023-05-04 DIAGNOSIS — Z0000 Encounter for general adult medical examination without abnormal findings: Secondary | ICD-10-CM

## 2023-05-04 NOTE — Patient Instructions (Addendum)
 Vanessa Fuentes , Thank you for taking time to come for your Medicare Wellness Visit. I appreciate your ongoing commitment to your health goals. Please review the following plan we discussed and let me know if I can assist you in the future.   Referrals/Orders/Follow-Ups/Clinician Recommendations: Aim for 30 minutes of exercise or brisk walking, 6-8 glasses of water, and 5 servings of fruits and vegetables each day.   This is a list of the screening recommended for you and due dates:  Health Maintenance  Topic Date Due   DTaP/Tdap/Td vaccine (3 - Td or Tdap) 04/24/2023   Colon Cancer Screening  06/05/2023*   Flu Shot  08/24/2023   Medicare Annual Wellness Visit  05/03/2024   Pneumonia Vaccine  Completed   DEXA scan (bone density measurement)  Completed   Hepatitis C Screening  Completed   Zoster (Shingles) Vaccine  Completed   HPV Vaccine  Aged Out   Meningitis B Vaccine  Aged Out   COVID-19 Vaccine  Discontinued  *Topic was postponed. The date shown is not the original due date.    Advanced directives: (Copy Requested) Please bring a copy of your health care power of attorney and living will to the office to be added to your chart at your convenience. You can mail to [facility] [address] [place], [place] [address] or email to [email or web address]  Next Medicare Annual Wellness Visit scheduled for next year: Yes

## 2023-05-04 NOTE — Progress Notes (Signed)
 Subjective:  Please attest and cosign this visit due to patients primary care provider not being in the office at the time the visit was completed.  (Pt of Dr Sanda Linger)   Vanessa Fuentes is a 78 y.o. who presents for a Medicare Wellness preventive visit.  Visit Complete: Virtual I connected with  Loletha Carrow and Spouse, Anapaola Kinsel on 05/04/23 by a audio enabled telemedicine application and verified that I am speaking with the correct person using two identifiers.  Patient Location: Home  Provider Location: Office/Clinic  I discussed the limitations of evaluation and management by telemedicine. The patient expressed understanding and agreed to proceed.  Vital Signs: Because this visit was a virtual/telehealth visit, some criteria may be missing or patient reported. Any vitals not documented were not able to be obtained and vitals that have been documented are patient reported.  VideoDeclined- This patient declined Librarian, academic. Therefore the visit was completed with audio only.  Persons Participating in Visit: Patient assisted by Spouse, Deirdre Peer.  AWV Questionnaire: No: Patient Medicare AWV questionnaire was not completed prior to this visit.  Cardiac Risk Factors include: advanced age (>68men, >40 women);dyslipidemia;hypertension     Objective:    Today's Vitals   05/04/23 1020  Weight: 164 lb (74.4 kg)  Height: 5\' 6"  (1.676 m)   Body mass index is 26.47 kg/m.     05/04/2023   10:19 AM 04/25/2022    3:39 PM 11/03/2020    9:00 AM 07/31/2019   11:19 AM 12/26/2016    3:13 PM 11/24/2016    1:23 PM 10/18/2016    5:19 PM  Advanced Directives  Does Patient Have a Medical Advance Directive? Yes Yes Yes Yes Yes Yes Yes  Type of Estate agent of [place];Living will Healthcare Power of Attorney Living will;Healthcare Power of [employer] Power of [place];Living will Healthcare Power of [place];Living  will  Healthcare Power of [place];Living will  Does patient want to make changes to medical advance directive?   No - Patient declined No - Patient declined     Copy of Healthcare Power of Attorney in Chart? No - copy requested  No - copy requested No - copy requested No - copy requested  No - copy requested  Would patient like information on creating a medical advance directive?   No - Patient declined  No - Patient declined      Current Medications (verified) Outpatient Encounter Medications as of 05/04/2023  Medication Sig   Cholecalciferol (VITAMIN D-3 PO) Take 1,000 Units by mouth daily.   memantine (NAMENDA) 10 MG tablet Take 1 tablet (10 mg total) by mouth 2 (two) times daily.   Misc Natural Products (NEURIVA PO) Take 1 tablet by mouth daily.   omeprazole (PRILOSEC) 40 MG capsule TAKE 1 CAPSULE BY MOUTH DAILY   rosuvastatin (CRESTOR) 5 MG tablet TAKE 1 TABLET BY MOUTH AT BEDTIME   valsartan (DIOVAN) 160 MG tablet TAKE 1 TABLET BY MOUTH DAILY   No facility-administered encounter medications on file as of 05/04/2023.    Allergies (verified) Sulfonamide derivatives   History: Past Medical History:  Diagnosis Date   Brachial neuritis or radiculitis NOS    Cervicalgia    Degeneration of cervical intervertebral disc    Degeneration of lumbar or lumbosacral intervertebral disc    Dysfunction of eustachian tube    Memory loss    Osteoporosis 01/24/2015   Spinal stenosis in cervical region    Unspecified essential hypertension  Past Surgical History:  Procedure Laterality Date   CERVICAL DISCECTOMY  07/2009   C3-4 with fusion- Dr Claudina Lick   COLONOSCOPY     polypectomy   NASAL SEPTUM SURGERY  01/2009   [facility]   SPINE SURGERY     TUBAL LIGATION     Family History  Problem Relation Age of Onset   Hypertension Mother    Heart disease Mother        CAD MI CABG   Cancer Mother    Heart disease Father        CAD MI   Hypertension Father    Dementia Brother    Heart  disease Brother        CAD   Esophageal cancer Brother    COPD Brother    Cancer Maternal Aunt        Breast   Diabetes Neg Hx    Colon cancer Neg Hx    Stomach cancer Neg Hx    Social History   Socioeconomic History   Marital status: Married    Spouse name: Rosanne Ashing   Number of children: 1   Years of education: Not on file   Highest education level: Associate degree: occupational, Scientist, product/process development, or vocational program  Occupational History   Occupation: retired  Tobacco Use   Smoking status: Former    Current packs/day: 0.00    Types: Cigarettes    Quit date: 02/16/1981    Years since quitting: 42.2    Passive exposure: Past   Smokeless tobacco: Never   Tobacco comments:    During Adolescence  Substance and Sexual Activity   Alcohol use: Not Currently    Alcohol/week: 1.0 standard drink of alcohol    Types: 1 Glasses of wine per week    Comment: 1 glass per week sometimes   Drug use: No   Sexual activity: Yes    Partners: Male  Other Topics Concern   Not on file  Social History Narrative   05/24/21 lives with husband   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 Married in 1969, one daughter-'73 and 2 grandchildren.  Work- retired from  [employer] October '12 after 43 years. Life is good.    Social Drivers of Corporate investment banker Strain: Low Risk  (05/04/2023)   Overall Financial Resource Strain (CARDIA)    Difficulty of Paying Living Expenses: Not hard at all  Food Insecurity: No Food Insecurity (05/04/2023)   Hunger Vital Sign    Worried About Running Out of Food in the Last Year: Never true    Ran Out of Food in the Last Year: Never true  Transportation Needs: No Transportation Needs (05/04/2023)   PRAPARE - Administrator, Civil Service (Medical): No    Lack of Transportation (Non-Medical): No  Physical Activity: Insufficiently Active (05/04/2023)   Exercise Vital Sign    Days of Exercise per Week: 3 days    Minutes of Exercise per Session: 20 min  Stress: No Stress  Concern Present (05/04/2023)   [employer] of Occupational Health - Occupational Stress Questionnaire    Feeling of Stress : Not at all  Social Connections: Moderately Isolated (05/04/2023)   Social Connection and Isolation Panel [NHANES]    Frequency of Communication with Friends and Family: More than three times a week    Frequency of Social Gatherings with Friends and Family: More than three times a week    Attends Religious Services: Never    Database administrator or Organizations: No  Attends Banker Meetings: Never    Marital Status: Married    Tobacco Counseling Counseling given: No Tobacco comments: During Adolescence    Clinical Intake:  Pre-visit preparation completed: Yes  Pain : No/denies pain     BMI - recorded: 26.47 Nutritional Risks: None Diabetes: No  Lab Results  Component Value Date   HGBA1C 5.0 08/10/2015   HGBA1C 5.1 04/23/2013     How often do you need to have someone help you when you read instructions, pamphlets, or other written materials from your doctor or pharmacy?: 1 - Never  Interpreter Needed?: No  Information entered by :: Hassell Halim, CMA   Activities of Daily Living     05/04/2023   10:25 AM  In your present state of health, do you have any difficulty performing the following activities:  Hearing? 0  Vision? 0  Difficulty concentrating or making decisions? 1  Comment Spouse handles  Walking or climbing stairs? 0  Dressing or bathing? 0  Doing errands, shopping? 1  Comment Spouse handles  Preparing Food and eating ? Y  Comment Spouse handles  Using the Toilet? N  In the past six months, have you accidently leaked urine? N  Do you have problems with loss of bowel control? N  Managing your Medications? Y  Comment Spouse handles  Managing your Finances? Y  Comment Spouse handles  Housekeeping or managing your Housekeeping? Y  Comment Spouse handles    Patient Care Team: Etta Grandchild, MD as PCP  - General (Internal Medicine) Szabat, Vinnie Level, [facility] (Inactive) as Pharmacist (Pharmacist)  Indicate any recent Medical Services you may have received from other than [facility] providers in the past year (date may be approximate).     Assessment:   This is a routine wellness examination for Malon.  Hearing/Vision screen Hearing Screening - Comments:: Denies hearing difficulties   Vision Screening - Comments:: Wears rx glasses - up to date with routine eye exams    Goals Addressed               This Visit's Progress     Patient Stated (pt-stated)        Spouse stated they plan to continue exercising and following safety measures.       Depression Screen     05/04/2023   10:27 AM 12/07/2022    3:51 PM 06/05/2022    4:09 PM 11/03/2020    8:58 AM 07/31/2019   11:20 AM 11/27/2018    3:51 PM 11/28/2017    5:41 PM  PHQ 2/9 Scores  PHQ - 2 Score 0 0 0 0 0 0 0  PHQ- 9 Score 3  0        Fall Risk     05/04/2023   10:26 AM 12/07/2022    3:50 PM 06/05/2022    4:08 PM 05/24/2021    3:39 PM 11/03/2020    9:03 AM  Fall Risk   Falls in the past year? 0 0 0 0 0  Number falls in past yr: 0 0 0  0  Injury with Fall? 0 0 0  0  Risk for fall due to : No Fall Risks No Fall Risks No Fall Risks  No Fall Risks  Follow up Falls prevention discussed;Falls evaluation completed Falls evaluation completed Falls evaluation completed  Falls evaluation completed    MEDICARE RISK AT HOME:  Medicare Risk at Home Any stairs in or around the home?: No If so, are there any  without handrails?: No Home free of loose throw rugs in walkways, pet beds, electrical cords, etc?: Yes Adequate lighting in your home to reduce risk of falls?: Yes Life alert?: No Use of a cane, walker or w/c?: No Grab bars in the bathroom?: Yes Shower chair or bench in shower?: Yes Elevated toilet seat or a handicapped toilet?: Yes  TIMED UP AND GO:  Was the test performed?  No  Cognitive Function: Declined: Patient  declined cognitive screening, but was able to answer questions in an accurate and timely manner. No cognitive impairments observed.    05/04/2023   10:34 AM 07/05/2020   10:27 AM  MMSE - Mini Mental State Exam  Not completed: Unable to complete   Orientation to time  3  Orientation to Place  4  Registration  3  Attention/ Calculation  1  Recall  0  Language- name 2 objects  2  Language- repeat  0  Language- follow 3 step command  3  Language- read & follow direction  1  Write a sentence  1  Copy design  1  Total score  19        Immunizations Immunization History  Administered Date(s) Administered   Fluad Quad(high Dose 65+) 11/12/2018, 10/03/2021   Influenza Split 11/13/2013   Influenza Whole 12/22/2009, 10/17/2011   Influenza, High Dose Seasonal PF 09/06/2012, 09/13/2014, 10/17/2016, 11/27/2017, 10/24/2018   Influenza-Unspecified 09/13/2014, 10/27/2020, 10/24/2022   Moderna Sars-Covid-2 Vaccination 03/03/2019, 03/31/2019, 04/27/2020, 08/30/2020   Pfizer Covid-19 Vaccine Bivalent Booster 52yrs & up 11/02/2020   Pneumococcal Conjugate-13 11/13/2013   Pneumococcal Polysaccharide-23 09/06/2012, 12/20/2020   Td 12/23/1996   Tdap 04/23/2013   Zoster Recombinant(Shingrix) 01/19/2021, 03/28/2021   Zoster, Live 12/22/2009    Screening Tests Health Maintenance  Topic Date Due   DTaP/Tdap/Td (3 - Td or Tdap) 04/24/2023   Colonoscopy  06/05/2023 (Originally 02/04/2020)   INFLUENZA VACCINE  08/24/2023   Medicare Annual Wellness (AWV)  05/03/2024   Pneumonia Vaccine 28+ Years old  Completed   DEXA SCAN  Completed   Hepatitis C Screening  Completed   Zoster Vaccines- Shingrix  Completed   HPV VACCINES  Aged Out   Meningococcal B Vaccine  Aged Out   COVID-19 Vaccine  Discontinued    Health Maintenance  Health Maintenance Due  Topic Date Due   DTaP/Tdap/Td (3 - Td or Tdap) 04/24/2023   Health Maintenance Items Addressed: 05/04/2023   Additional Screening:  Vision  Screening: Recommended annual ophthalmology exams for early detection of glaucoma and other disorders of the eye.  Dental Screening: Recommended annual dental exams for proper oral hygiene  Community Resource Referral / Chronic Care Management: CRR required this visit?  No   CCM required this visit?  No     Plan:     I have personally reviewed and noted the following in the patient's chart:   Medical and social history Use of alcohol, tobacco or illicit drugs  Current medications and supplements including opioid prescriptions. Patient is not currently taking opioid prescriptions. Functional ability and status Nutritional status Physical activity Advanced directives List of other physicians Hospitalizations, surgeries, and ER visits in previous 12 months Vitals Screenings to include cognitive, depression, and falls Referrals and appointments  In addition, I have reviewed and discussed with patient certain preventive protocols, quality metrics, and best practice recommendations. A written personalized care plan for preventive services as well as general preventive health recommendations were provided to patient.     Darreld Mclean, CMA  05/04/2023   After Visit Summary: (MyChart) Due to this being a telephonic visit, the after visit summary with patients personalized plan was offered to patient via MyChart   Notes: Nothing significant to report at this time.

## 2023-05-18 ENCOUNTER — Other Ambulatory Visit: Payer: Self-pay | Admitting: Internal Medicine

## 2023-05-18 DIAGNOSIS — E785 Hyperlipidemia, unspecified: Secondary | ICD-10-CM

## 2023-05-28 ENCOUNTER — Other Ambulatory Visit: Payer: Self-pay

## 2023-05-28 ENCOUNTER — Emergency Department (HOSPITAL_COMMUNITY)
Admission: EM | Admit: 2023-05-28 | Discharge: 2023-05-29 | Discharge status: Against Medical Advice | Attending: Emergency Medicine | Admitting: Emergency Medicine

## 2023-05-28 DIAGNOSIS — M25511 Pain in right shoulder: Secondary | ICD-10-CM | POA: Diagnosis not present

## 2023-05-28 DIAGNOSIS — M542 Cervicalgia: Secondary | ICD-10-CM | POA: Insufficient documentation

## 2023-05-28 DIAGNOSIS — Z5321 Procedure and treatment not carried out due to patient leaving prior to being seen by health care provider: Secondary | ICD-10-CM | POA: Diagnosis not present

## 2023-05-28 NOTE — ED Triage Notes (Signed)
 PT complains or right shoulder and right neck pain x 3 days. Denies injury. Unknown cause. No other complaints.

## 2023-05-29 ENCOUNTER — Ambulatory Visit: Admitting: Internal Medicine

## 2023-05-29 NOTE — ED Notes (Signed)
 Pts husband stated "this is terrible," and they decided to leave. Left at 2:05am.

## 2023-05-31 ENCOUNTER — Ambulatory Visit (INDEPENDENT_AMBULATORY_CARE_PROVIDER_SITE_OTHER): Admitting: Internal Medicine

## 2023-05-31 ENCOUNTER — Encounter: Payer: Self-pay | Admitting: Internal Medicine

## 2023-05-31 VITALS — BP 104/80 | HR 81 | Ht 66.0 in | Wt 159.0 lb

## 2023-05-31 DIAGNOSIS — R208 Other disturbances of skin sensation: Secondary | ICD-10-CM | POA: Diagnosis not present

## 2023-05-31 DIAGNOSIS — M5412 Radiculopathy, cervical region: Secondary | ICD-10-CM | POA: Diagnosis not present

## 2023-05-31 MED ORDER — VALACYCLOVIR HCL 1 G PO TABS: 1000.0000 mg | ORAL_TABLET | Freq: Three times a day (TID) | ORAL | 0 refills | Status: AC

## 2023-05-31 MED ORDER — METHYLPREDNISOLONE 4 MG PO TBPK: ORAL_TABLET | ORAL | 0 refills | Status: DC

## 2023-05-31 NOTE — Assessment & Plan Note (Addendum)
 R shoulder  A new burning sensation over R shoulder x 1 week- ?pain. No rash. Sleeps on the couch a lot, not in bed, falls asleep watching TV... She is a poor historian, husband is helping w/hx  Likely it is a cervical radiculopathy on the L. Medrol pack, ice/heat Sleep in bed! Blue-Emu cream was recommended to use 2-3 times a day  If rash - start Valtrex (Rx on paper)  F/u w/Dr Rochelle Chu

## 2023-05-31 NOTE — Assessment & Plan Note (Signed)
 A new burning sensation over R shoulder x 1 week- ?pain. No rash. Sleeps on the couch a lot, not in bed, falls asleep watching TV... She is a poor historian, husband is helping w/hx  Likely it is a cervical radiculopathy on the L. Medrol pack, ice/heat Sleep in bed! Blue-Emu cream was recommended to use 2-3 times a day

## 2023-05-31 NOTE — Progress Notes (Signed)
 Subjective:  Patient ID: Vanessa Fuentes, female    DOB: 1945-11-13  Age: 78 y.o. MRN: [id number]  CC: Shoulder Pain (Right shoulder pain/burning for the past week. Slight lack of mobility. No past injury they are aware of. No swelling or discoloration. Currently treating with extra strength tylenol which seems to be helping)   HPI [name] presents for a burning sensation over R shoulder x 1 week- ?pain. No rash Sleeps on the couch a lot, not in bed, falls asleep watching TV... She is a poor historian, husband is helping w/hx  Outpatient Medications Prior to Visit  Medication Sig Dispense Refill   Cholecalciferol (VITAMIN D -3 PO) Take 1,000 Units by mouth daily.     memantine  (NAMENDA ) 10 MG tablet Take 1 tablet (10 mg total) by mouth 2 (two) times daily. 180 tablet 1   Misc Natural Products (NEURIVA PO) Take 1 tablet by mouth daily.     omeprazole  (PRILOSEC) 40 MG capsule TAKE 1 CAPSULE BY MOUTH DAILY 90 capsule 0   rosuvastatin  (CRESTOR ) 5 MG tablet TAKE 1 TABLET BY MOUTH AT BEDTIME 90 tablet 0   valsartan  (DIOVAN ) 160 MG tablet TAKE 1 TABLET BY MOUTH DAILY 90 tablet 1   No facility-administered medications prior to visit.    ROS: Review of Systems  Constitutional:  Negative for chills and fever.  Respiratory:  Negative for shortness of breath.   Musculoskeletal:  Positive for neck pain. Negative for arthralgias.  Skin:  Negative for rash.  Psychiatric/Behavioral:  Positive for confusion and decreased concentration. Negative for dysphoric mood and suicidal ideas.     Objective:  BP 104/80   Pulse 81   Ht 5\' 6"  (1.676 m)   Wt 159 lb (72.1 kg)   SpO2 98%   BMI 25.66 kg/m   BP Readings from Last 3 Encounters:  05/31/23 104/80  05/29/23 (!) 118/101  12/07/22 (!) 142/84    Wt Readings from Last 3 Encounters:  05/31/23 159 lb (72.1 kg)  05/28/23 163 lb 2.3 oz (74 kg)  05/04/23 164 lb (74.4 kg)    Physical Exam Constitutional:      General: She is not  in acute distress.    Appearance: She is well-developed.  HENT:     Head: Normocephalic.     Right Ear: External ear normal.     Left Ear: External ear normal.     Nose: Nose normal.  Eyes:     General:        Right eye: No discharge.        Left eye: No discharge.     Conjunctiva/sclera: Conjunctivae normal.     Pupils: Pupils are equal, round, and reactive to light.  Neck:     Thyroid : No thyromegaly.     Vascular: No JVD.     Trachea: No tracheal deviation.  Cardiovascular:     Rate and Rhythm: Normal rate and regular rhythm.     Heart sounds: Normal heart sounds.  Pulmonary:     Effort: No respiratory distress.     Breath sounds: No stridor. No wheezing.  Abdominal:     General: Bowel sounds are normal. There is no distension.     Palpations: Abdomen is soft. There is no mass.     Tenderness: There is no abdominal tenderness. There is no guarding or rebound.  Musculoskeletal:        General: No tenderness.     Cervical back: Normal range of motion and neck supple.  No rigidity.     Right lower leg: No edema.     Left lower leg: No edema.  Lymphadenopathy:     Cervical: No cervical adenopathy.  Skin:    Findings: No erythema or rash.  Neurological:     Mental Status: Mental status is at baseline.     Cranial Nerves: No cranial nerve deficit.     Motor: No abnormal muscle tone.     Coordination: Coordination normal.     Deep Tendon Reflexes: Reflexes normal.  Psychiatric:        Behavior: Behavior normal.   Shoulders, neck - NT w/good ROM  Lab Results  Component Value Date   WBC 7.0 12/08/2022   HGB 13.6 12/08/2022   HCT 41.1 12/08/2022   PLT 199.0 12/08/2022   GLUCOSE 91 12/08/2022   CHOL 147 12/08/2022   TRIG 83.0 12/08/2022   HDL 55.40 12/08/2022   LDLDIRECT 148.3 04/13/2011   LDLCALC 75 12/08/2022   ALT 13 12/08/2022   AST 17 12/08/2022   NA 140 12/08/2022   K 4.1 12/08/2022   CL 104 12/08/2022   CREATININE 0.99 12/08/2022   BUN 14 12/08/2022    CO2 30 12/08/2022   TSH 1.00 12/08/2022   HGBA1C 5.0 08/10/2015    No results found.  Assessment & Plan:   Problem List Items Addressed This Visit     Cervical radiculopathy   A new burning sensation over R shoulder x 1 week- ?pain. No rash. Sleeps on the couch a lot, not in bed, falls asleep watching TV... She is a poor historian, husband is helping w/hx  Likely it is a cervical radiculopathy on the L. Medrol pack, ice/heat Sleep in bed! Blue-Emu cream was recommended to use 2-3 times a day       Burning sensation - Primary   R shoulder  A new burning sensation over R shoulder x 1 week- ?pain. No rash. Sleeps on the couch a lot, not in bed, falls asleep watching TV... She is a poor historian, husband is helping w/hx  Likely it is a cervical radiculopathy on the L. Medrol pack, ice/heat Sleep in bed! Blue-Emu cream was recommended to use 2-3 times a day  If rash - start Valtrex (Rx on paper)  F/u w/Dr Rochelle Chu         Meds ordered this encounter  Medications   methylPREDNISolone (MEDROL DOSEPAK) 4 MG TBPK tablet    Sig: As directed    Dispense:  21 tablet    Refill:  0   valACYclovir (VALTREX) 1000 MG tablet    Sig: Take 1 tablet (1,000 mg total) by mouth 3 (three) times daily.    Dispense:  21 tablet    Refill:  0      Follow-up: No follow-ups on file.  Anitra Barn, MD

## 2023-05-31 NOTE — Patient Instructions (Addendum)
 Blue-Emu cream  use 3 times a day Ice pack

## 2023-06-19 ENCOUNTER — Ambulatory Visit (INDEPENDENT_AMBULATORY_CARE_PROVIDER_SITE_OTHER): Admitting: Internal Medicine

## 2023-06-19 ENCOUNTER — Ambulatory Visit: Payer: Self-pay | Admitting: Internal Medicine

## 2023-06-19 ENCOUNTER — Encounter: Payer: Self-pay | Admitting: Internal Medicine

## 2023-06-19 ENCOUNTER — Ambulatory Visit (INDEPENDENT_AMBULATORY_CARE_PROVIDER_SITE_OTHER)

## 2023-06-19 VITALS — BP 132/76 | HR 73 | Temp 98.1°F | Resp 16 | Ht 66.0 in | Wt 159.2 lb

## 2023-06-19 DIAGNOSIS — M5412 Radiculopathy, cervical region: Secondary | ICD-10-CM

## 2023-06-19 DIAGNOSIS — M8580 Other specified disorders of bone density and structure, unspecified site: Secondary | ICD-10-CM | POA: Diagnosis not present

## 2023-06-19 DIAGNOSIS — M4802 Spinal stenosis, cervical region: Secondary | ICD-10-CM | POA: Diagnosis not present

## 2023-06-19 DIAGNOSIS — M542 Cervicalgia: Secondary | ICD-10-CM | POA: Diagnosis not present

## 2023-06-19 DIAGNOSIS — I10 Essential (primary) hypertension: Secondary | ICD-10-CM | POA: Diagnosis not present

## 2023-06-19 DIAGNOSIS — M25511 Pain in right shoulder: Secondary | ICD-10-CM | POA: Insufficient documentation

## 2023-06-19 DIAGNOSIS — M5030 Other cervical disc degeneration, unspecified cervical region: Secondary | ICD-10-CM | POA: Insufficient documentation

## 2023-06-19 DIAGNOSIS — M85811 Other specified disorders of bone density and structure, right shoulder: Secondary | ICD-10-CM | POA: Diagnosis not present

## 2023-06-19 DIAGNOSIS — M19011 Primary osteoarthritis, right shoulder: Secondary | ICD-10-CM | POA: Diagnosis not present

## 2023-06-19 DIAGNOSIS — M47812 Spondylosis without myelopathy or radiculopathy, cervical region: Secondary | ICD-10-CM | POA: Diagnosis not present

## 2023-06-19 NOTE — Progress Notes (Unsigned)
 Subjective:  Patient ID: Vanessa Fuentes, female    DOB: Apr 27, 1945  Age: 78 y.o. MRN: [id number]  CC: Shoulder Pain (Right shoulder. Patient seen Dr. Arlester Ladd on the 8th of this month and she states that she is still having the pain. )   HPI Vanessa Fuentes presents for f/up -----  Discussed the use of AI scribe software for clinical note transcription with the patient, who gave verbal consent to proceed.  History of Present Illness   Vanessa Fuentes is a 78 year old female who presents with persistent burning shoulder pain and right neck pain. She is accompanied by her husband.  She has been experiencing a burning sensation in her shoulder for the past three weeks, which began approximately one week before her last visit. The pain is described as a persistent burning located in the shoulder socket. No associated numbness, weakness, or tingling, and she denies any recent injury or falls.  She has been taking Valtrex  and prednisone  as prescribed, along with Tylenol twice a day, which provides periodic relief. Despite these treatments, the burning sensation persists. Sleeping in her bed rather than on the couch has helped somewhat.  No other joint pain, chest pain, shortness of breath, dizziness, or lightheadedness. She feels good when active.       Outpatient Medications Prior to Visit  Medication Sig Dispense Refill   Cholecalciferol (VITAMIN D -3 PO) Take 1,000 Units by mouth daily.     memantine  (NAMENDA ) 10 MG tablet Take 1 tablet (10 mg total) by mouth 2 (two) times daily. 180 tablet 1   Misc Natural Products (NEURIVA PO) Take 1 tablet by mouth daily.     omeprazole  (PRILOSEC) 40 MG capsule TAKE 1 CAPSULE BY MOUTH DAILY 90 capsule 0   rosuvastatin  (CRESTOR ) 5 MG tablet Take 1 tablet (5 mg total) by mouth at bedtime. Needs appointment with Dr. Rochelle Chu for refills. 30 tablet 0   valACYclovir  (VALTREX ) 1000 MG tablet Take 1 tablet (1,000 mg total) by mouth 3 (three) times daily. 21  tablet 0   valsartan  (DIOVAN ) 160 MG tablet TAKE 1 TABLET BY MOUTH DAILY 90 tablet 1   methylPREDNISolone  (MEDROL  DOSEPAK) 4 MG TBPK tablet As directed 21 tablet 0   No facility-administered medications prior to visit.    ROS Review of Systems  Constitutional:  Negative for appetite change, chills, diaphoresis and fatigue.  HENT: Negative.  Negative for trouble swallowing and voice change.   Respiratory: Negative.  Negative for choking, shortness of breath and wheezing.   Cardiovascular:  Negative for chest pain, palpitations and leg swelling.  Gastrointestinal: Negative.  Negative for abdominal pain, diarrhea, nausea and vomiting.  Genitourinary:  Negative for difficulty urinating.  Musculoskeletal:  Positive for arthralgias and neck pain. Negative for joint swelling, myalgias and neck stiffness.  Skin: Negative.   Neurological: Negative.  Negative for dizziness and weakness.  Hematological:  Negative for adenopathy. Does not bruise/bleed easily.  Psychiatric/Behavioral:  Positive for confusion and decreased concentration. Negative for sleep disturbance. The patient is not nervous/anxious.     Objective:  BP 132/76 (BP Location: Left Arm, Patient Position: Sitting, Cuff Size: Normal)   Pulse 73   Temp 98.1 F (36.7 C) (Oral)   Resp 16   Ht 5\' 6"  (1.676 m)   Wt 159 lb 3.2 oz (72.2 kg)   SpO2 94%   BMI 25.70 kg/m   BP Readings from Last 3 Encounters:  06/19/23 132/76  05/31/23 104/80  05/29/23 (!) 118/101  Wt Readings from Last 3 Encounters:  06/19/23 159 lb 3.2 oz (72.2 kg)  05/31/23 159 lb (72.1 kg)  05/28/23 163 lb 2.3 oz (74 kg)    Physical Exam Vitals reviewed.  Constitutional:      Appearance: Normal appearance.  HENT:     Mouth/Throat:     Mouth: Mucous membranes are moist.  Eyes:     General: No scleral icterus.    Conjunctiva/sclera: Conjunctivae normal.  Cardiovascular:     Rate and Rhythm: Normal rate and regular rhythm.     Heart sounds: No  murmur heard.    No friction rub. No gallop.  Pulmonary:     Breath sounds: No stridor. No wheezing, rhonchi or rales.  Abdominal:     General: Abdomen is flat.     Palpations: There is no mass.     Tenderness: There is no abdominal tenderness. There is no guarding.     Hernia: No hernia is present.  Musculoskeletal:        General: Normal range of motion.     Cervical back: Neck supple. Normal range of motion.     Right lower leg: No edema.     Left lower leg: No edema.  Lymphadenopathy:     Cervical: No cervical adenopathy.  Skin:    General: Skin is warm.     Findings: No rash.  Neurological:     General: No focal deficit present.     Mental Status: She is alert. Mental status is at baseline.  Psychiatric:        Mood and Affect: Mood normal.        Behavior: Behavior normal.     Lab Results  Component Value Date   WBC 7.0 12/08/2022   HGB 13.6 12/08/2022   HCT 41.1 12/08/2022   PLT 199.0 12/08/2022   GLUCOSE 91 12/08/2022   CHOL 147 12/08/2022   TRIG 83.0 12/08/2022   HDL 55.40 12/08/2022   LDLDIRECT 148.3 04/13/2011   LDLCALC 75 12/08/2022   ALT 13 12/08/2022   AST 17 12/08/2022   NA 140 12/08/2022   K 4.1 12/08/2022   CL 104 12/08/2022   CREATININE 0.99 12/08/2022   BUN 14 12/08/2022   CO2 30 12/08/2022   TSH 1.00 12/08/2022   HGBA1C 5.0 08/10/2015   DG Cervical Spine Complete Result Date: 06/19/2023 CLINICAL DATA:  Neck pain. EXAM: CERVICAL SPINE - COMPLETE 4+ VIEW COMPARISON:  Cervical spine radiograph dated 05/28/2018. FINDINGS: There is no acute fracture or subluxation of the cervical spine. The bones are osteopenic. Multilevel degenerative changes with disc space narrowing and endplate irregularity and spurring. C3-C4 ACDF. The visualized posterior elements are intact. There is anatomic alignment of the lateral masses of C1 and C2. The soft tissues are unremarkable. IMPRESSION: 1. No acute findings. 2. Multilevel degenerative changes. Electronically  Signed   By: Angus Bark M.D.   On: 06/19/2023 13:25   DG Shoulder Right Result Date: 06/19/2023 CLINICAL DATA:  Right shoulder pain. EXAM: RIGHT SHOULDER - 2+ VIEW COMPARISON:  None Available. FINDINGS: There is no evidence of fracture or dislocation. Mild mild-to-moderate degenerative changes are seen involving the acromioclavicular joint. Generalized osteopenia also noted. IMPRESSION: No acute findings. Mild to moderate acromioclavicular DJD. Osteopenia. Electronically Signed   By: Marlyce Sine M.D.   On: 06/19/2023 13:03     Assessment & Plan:  Cervical radiculopathy -     DG Cervical Spine Complete; Future -     Ambulatory referral to Physical  Medicine Rehab  Acute pain of right shoulder -     DG Shoulder Right; Future  Degenerative, intervertebral disc, cervical -     Ambulatory referral to Physical Medicine Rehab  Essential hypertension- Her BP is well controlled.     Follow-up: Return if symptoms worsen or fail to improve.  Sandra Crouch, MD

## 2023-06-19 NOTE — Patient Instructions (Signed)
Shoulder Pain Many things can cause shoulder pain, including: An injury to the shoulder. Overuse of the shoulder. Arthritis. The source of the pain can be: Inflammation. An injury to the shoulder joint. An injury to a tendon, ligament, or bone. Follow these instructions at home: Pay attention to changes in your symptoms. Let your health care provider know about them. Follow these instructions to relieve your pain. If you have a removable sling: Wear the sling as told by your provider. Remove it only as told by your provider. Check the skin around the sling every day. Tell your provider about any concerns. Loosen the sling if your fingers tingle, become numb, or become cold. Keep the sling clean. If the sling is not waterproof: Do not let it get wet. Remove it to shower or bathe. Move your arm as little as possible, but keep your hand moving to prevent swelling. Managing pain, stiffness, and swelling  If told, put ice on the painful area. If you have a removable sling or immobilizer, remove it as told by your provider. Put ice in a plastic bag. Place a towel between your skin and the bag. Leave the ice on for 20 minutes, 2-3 times a day. If your skin turns bright red, remove the ice right away to prevent skin damage. The risk of damage is higher if you cannot feel pain, heat, or cold. Move your fingers often to reduce stiffness and swelling. Squeeze a soft ball or a foam pad as much as possible. This helps to keep the shoulder from swelling. It also helps to strengthen the arm. General instructions Take over-the-counter and prescription medicines only as told by your provider. Exercise may help with pain management. Perform exercises if told by your provider. You may be referred to a physical therapist to help in your recovery process. Keep all follow-up visits in order to avoid any type of permanent shoulder disability or chronic pain problems. Contact a health care provider  if: Your pain is not relieved with medicines. New pain develops in your arm, hand, or fingers. You loosen your sling and your arm, hand, or fingers remain tingly, numb, swollen, or painful. Get help right away if: Your arm, hand, or fingers turn white or blue. This information is not intended to replace advice given to you by your health care provider. Make sure you discuss any questions you have with your health care provider. Document Revised: 08/12/2021 Document Reviewed: 08/12/2021 Elsevier Patient Education  2024 [employer]  

## 2023-06-20 ENCOUNTER — Other Ambulatory Visit: Payer: Self-pay | Admitting: Internal Medicine

## 2023-06-20 DIAGNOSIS — F01B0 Vascular dementia, moderate, without behavioral disturbance, psychotic disturbance, mood disturbance, and anxiety: Secondary | ICD-10-CM

## 2023-06-21 NOTE — Telephone Encounter (Signed)
 Copied from CRM [id number]. Topic: Referral - Question >> Jun 21, 2023  8:39 AM Vanessa Fuentes wrote: Reason for CRM: Pt's husband Vanessa Fuentes stated that he responded to Dr.Jones message about the musculoskeletal referral. Vanessa Fuentes stated that they wanted to proceed with the referral but didn't receive a response from Dr.Jones. Vanessa Fuentes would also like to know what office or provider the pt was referred to so they can schedule the appt.

## 2023-06-25 NOTE — Telephone Encounter (Signed)
 Copied from CRM [id number]. Topic: General - Call Back - No Documentation >> Jun 25, 2023  9:28 AM Freya Jesus wrote: Reason for CRM: Patient spouse called regarding missed call. Said to please call his cell: [phone].

## 2023-06-26 NOTE — Telephone Encounter (Signed)
 Copied from CRM [id number]. Topic: General - Call Back - No Documentation >> Jun 25, 2023  9:28 AM Freya Jesus wrote: Reason for CRM: Patient spouse called regarding missed call. Said to please call his cell: [phone]. >> Jun 26, 2023  1:52 PM Turkey A wrote: Patient's husband called and would like for someone to call back regarding referral

## 2023-06-27 NOTE — Telephone Encounter (Signed)
 Patients husband has been made aware of the information on her referral and he gave a verbal understanding.

## 2023-07-01 ENCOUNTER — Other Ambulatory Visit: Payer: Self-pay | Admitting: Internal Medicine

## 2023-07-01 DIAGNOSIS — E785 Hyperlipidemia, unspecified: Secondary | ICD-10-CM

## 2023-07-09 DIAGNOSIS — K080 Exfoliation of teeth due to systemic causes: Secondary | ICD-10-CM | POA: Diagnosis not present

## 2023-07-10 ENCOUNTER — Ambulatory Visit: Admitting: Physical Medicine and Rehabilitation

## 2023-07-10 ENCOUNTER — Encounter: Payer: Self-pay | Admitting: Physical Medicine and Rehabilitation

## 2023-07-10 DIAGNOSIS — M25511 Pain in right shoulder: Secondary | ICD-10-CM | POA: Diagnosis not present

## 2023-07-10 DIAGNOSIS — M961 Postlaminectomy syndrome, not elsewhere classified: Secondary | ICD-10-CM

## 2023-07-10 DIAGNOSIS — M5412 Radiculopathy, cervical region: Secondary | ICD-10-CM | POA: Diagnosis not present

## 2023-07-10 DIAGNOSIS — G8929 Other chronic pain: Secondary | ICD-10-CM | POA: Diagnosis not present

## 2023-07-10 NOTE — Progress Notes (Signed)
 Pain Scale   Average Pain 10 Patient advising she has Chronic shoulder(right) pain, patient advising her neck pain is not bothering her       +Driver, -BT, -Dye Allergies.

## 2023-07-10 NOTE — Progress Notes (Signed)
 Vanessa Fuentes - 78 y.o. female MRN [id number]  Date of birth: September 01, 1945  Office Visit Note: Visit Date: 07/10/2023 PCP: Arcadio Knuckles, MD Referred by: Arcadio Knuckles, MD  Subjective: Chief Complaint  Patient presents with   Neck - Pain   Right Shoulder - Pain   HPI: Vanessa Fuentes is a 78 y.o. female who comes in today per the request of Dr. Sandra Crouch for evaluation of chronic, worsening and severe pain to right neck and shoulder radiating down to elbow. Pain started about 1 month ago. Patient is poor historian, history of vascular dementia, husband at bedside. No specific aggravating factors, describes as burning sensation, currently rates as 5 out of 10. Some relief of pain with home exercise regimen, rest and use of medications. Does take Tylenol as needed. No history of formal physical therapy. Recent cervical radiographs shows multi level degenerative changes with disc space narrowing and spurring, prior ACDF C3-C4. Husband believes surgery was done by Dr. Ellery Guthrie about 10 years ago. No history of cervical epidural steroid injections. Prior cervical MRI imaging from 2011 shows broad-based central and left-sided disc protrusion at C3-C4. There is also left sided foraminal stenosis at this level. Recent right shoulder radiographs show mild to moderate AC joint arthritis. Patient denies focal weakness, numbness and tingling. No recent trauma or falls.       Review of Systems  Musculoskeletal:  Positive for joint pain and neck pain.  Neurological:  Negative for tingling, sensory change, focal weakness and weakness.  All other systems reviewed and are negative.  Otherwise per HPI.  Assessment & Plan: Visit Diagnoses:    ICD-10-CM   1. Radiculopathy, cervical region  M54.12 MR CERVICAL SPINE WO CONTRAST    2. Chronic right shoulder pain  M25.511 MR CERVICAL SPINE WO CONTRAST   G89.29     3. Post laminectomy syndrome  M96.1 MR CERVICAL SPINE WO CONTRAST       Plan:  Findings:  Chronic, worsening and severe pain to right neck and shoulder radiating down to elbow. Patient continues to have severe pain despite good conservative therapies such as home exercise regimen, rest and use of medications. Patients clinical presentation and exam are consistent with cervical radiculopathy. She does have history of C3-C4 ACDF. She has full range of motion to right shoulder, no pain with abduction today. We discussed treatment plan in detail today. Next step is to obtain new cervical MRI imaging. Depending on results of MRI imaging would consider performing cervical epidural steroid injection. She can continue with Tylenol as needed, 500 mg up to three times a day. I will see her back for cervical MRI review. She has no questions at this time. No red flag symptoms noted upon exam today.     Meds & Orders: No orders of the defined types were placed in this encounter.   Orders Placed This Encounter  Procedures   MR CERVICAL SPINE WO CONTRAST    Follow-up: Return for Cervical MRI review.   Procedures: No procedures performed      Clinical History: No specialty comments available.   She reports that she quit smoking about 42 years ago. Her smoking use included cigarettes. She has been exposed to tobacco smoke. She has never used smokeless tobacco. No results for input(s): HGBA1C, LABURIC in the last 8760 hours.  Objective:  VS:  HT:    WT:   BMI:     BP:   HR: bpm  TEMP: ( )  RESP:  Physical Exam Vitals and nursing note reviewed.  HENT:     Head: Normocephalic and atraumatic.     Right Ear: External ear normal.     Left Ear: External ear normal.     Nose: Nose normal.     Mouth/Throat:     Mouth: Mucous membranes are moist.   Eyes:     Extraocular Movements: Extraocular movements intact.    Cardiovascular:     Rate and Rhythm: Normal rate.     Pulses: Normal pulses.  Pulmonary:     Effort: Pulmonary effort is normal.  Abdominal:     General:  Abdomen is flat. There is no distension.   Musculoskeletal:        General: Tenderness present.     Cervical back: Tenderness present.     Comments: No discomfort noted with flexion and extension. Mild pain with rotation to the right. Patient has good strength in the upper extremities including 5 out of 5 strength in wrist extension, long finger flexion and APB. Shoulder range of motion is full bilaterally without any sign of impingement. There is no atrophy of the hands intrinsically. Sensation intact bilaterally. Negative Hoffman's sign. Negative Spurling's sign.      Skin:    General: Skin is warm and dry.     Capillary Refill: Capillary refill takes less than 2 seconds.   Neurological:     General: No focal deficit present.     Mental Status: She is alert.   Psychiatric:        Mood and Affect: Mood normal.        Behavior: Behavior normal.     Ortho Exam  Imaging: No results found.  Past Medical/Family/Surgical/Social History: Medications & Allergies reviewed per EMR, new medications updated. Patient Active Problem List   Diagnosis Date Noted   Acute pain of right shoulder 06/19/2023   Degenerative, intervertebral disc, cervical 06/19/2023   Premature atrial complexes 12/07/2022   Encounter for general adult medical examination with abnormal findings 12/07/2022   Mild vascular dementia without behavioral disturbance, psychotic disturbance, mood disturbance, or anxiety (HCC) 02/02/2021   Moderate vascular dementia without behavioral disturbance, psychotic disturbance, mood disturbance, or anxiety (HCC) 02/02/2021   Callous ulcer, limited to breakdown of skin (HCC) 02/01/2021   Stage 3a chronic kidney disease (HCC) 12/21/2020   Lumbar radiculopathy 06/13/2018   Gastroesophageal reflux disease with esophagitis 03/21/2016   IBS (irritable bowel syndrome) 07/20/2014   Osteoporosis 04/24/2013   Hyperlipidemia with target LDL less than 130 04/14/2011   Cervical radiculopathy  05/06/2009   Essential hypertension 10/13/2008   DEGENERATIVE DISC DISEASE, LUMBAR SPINE 10/13/2008   Past Medical History:  Diagnosis Date   Brachial neuritis or radiculitis NOS    Cervicalgia    Degeneration of cervical intervertebral disc    Degeneration of lumbar or lumbosacral intervertebral disc    Dysfunction of eustachian tube    Memory loss    Osteoporosis 01/24/2015   Spinal stenosis in cervical region    Unspecified essential hypertension    Family History  Problem Relation Age of Onset   Hypertension Mother    Heart disease Mother        CAD MI CABG   Cancer Mother    Heart disease Father        CAD MI   Hypertension Father    Dementia Brother    Heart disease Brother        CAD   Esophageal cancer Brother  COPD Brother    Cancer Maternal Aunt        Breast   Diabetes Neg Hx    Colon cancer Neg Hx    Stomach cancer Neg Hx    Past Surgical History:  Procedure Laterality Date   CERVICAL DISCECTOMY  07/2009   C3-4 with fusion- Dr Thayer Finders   COLONOSCOPY     polypectomy   NASAL SEPTUM SURGERY  01/2009   [facility]   SPINE SURGERY     TUBAL LIGATION     Social History   Occupational History   Occupation: retired  Tobacco Use   Smoking status: Former    Current packs/day: 0.00    Types: Cigarettes    Quit date: 02/16/1981    Years since quitting: 42.4    Passive exposure: Past   Smokeless tobacco: Never   Tobacco comments:    During Adolescence  Substance and Sexual Activity   Alcohol use: Not Currently    Alcohol/week: 1.0 standard drink of alcohol    Types: 1 Glasses of wine per week    Comment: 1 glass per week sometimes   Drug use: No   Sexual activity: Yes    Partners: Male

## 2023-07-18 ENCOUNTER — Encounter: Payer: Self-pay | Admitting: Physical Medicine and Rehabilitation

## 2023-07-23 ENCOUNTER — Other Ambulatory Visit: Payer: Self-pay | Admitting: Internal Medicine

## 2023-07-23 DIAGNOSIS — K2100 Gastro-esophageal reflux disease with esophagitis, without bleeding: Secondary | ICD-10-CM

## 2023-07-24 DIAGNOSIS — H35363 Drusen (degenerative) of macula, bilateral: Secondary | ICD-10-CM | POA: Diagnosis not present

## 2023-07-24 DIAGNOSIS — H43822 Vitreomacular adhesion, left eye: Secondary | ICD-10-CM | POA: Diagnosis not present

## 2023-07-24 DIAGNOSIS — H43812 Vitreous degeneration, left eye: Secondary | ICD-10-CM | POA: Diagnosis not present

## 2023-07-28 ENCOUNTER — Ambulatory Visit
Admission: RE | Admit: 2023-07-28 | Discharge: 2023-07-28 | Disposition: A | Discharge status: Home / Self Care | Source: Ambulatory Visit | Attending: Physical Medicine and Rehabilitation | Admitting: Physical Medicine and Rehabilitation

## 2023-07-28 DIAGNOSIS — M961 Postlaminectomy syndrome, not elsewhere classified: Secondary | ICD-10-CM | POA: Diagnosis not present

## 2023-07-28 DIAGNOSIS — M25511 Pain in right shoulder: Secondary | ICD-10-CM | POA: Diagnosis not present

## 2023-07-28 DIAGNOSIS — M5412 Radiculopathy, cervical region: Secondary | ICD-10-CM | POA: Diagnosis not present

## 2023-07-30 ENCOUNTER — Other Ambulatory Visit: Payer: Self-pay | Admitting: Internal Medicine

## 2023-07-30 DIAGNOSIS — E785 Hyperlipidemia, unspecified: Secondary | ICD-10-CM

## 2023-08-08 ENCOUNTER — Other Ambulatory Visit: Payer: Self-pay | Admitting: Internal Medicine

## 2023-08-08 DIAGNOSIS — I10 Essential (primary) hypertension: Secondary | ICD-10-CM

## 2023-08-20 ENCOUNTER — Telehealth: Payer: Self-pay | Admitting: Physical Medicine and Rehabilitation

## 2023-08-20 NOTE — Telephone Encounter (Signed)
 Patient's husband called. Says they haven't heard anything back from the MRI. MRI review

## 2023-08-27 DIAGNOSIS — K080 Exfoliation of teeth due to systemic causes: Secondary | ICD-10-CM | POA: Diagnosis not present

## 2023-08-29 ENCOUNTER — Encounter: Payer: Self-pay | Admitting: Physical Medicine and Rehabilitation

## 2023-08-29 ENCOUNTER — Ambulatory Visit: Admitting: Physical Medicine and Rehabilitation

## 2023-08-29 DIAGNOSIS — M5412 Radiculopathy, cervical region: Secondary | ICD-10-CM

## 2023-08-29 DIAGNOSIS — M4802 Spinal stenosis, cervical region: Secondary | ICD-10-CM

## 2023-08-29 DIAGNOSIS — M961 Postlaminectomy syndrome, not elsewhere classified: Secondary | ICD-10-CM | POA: Diagnosis not present

## 2023-08-29 DIAGNOSIS — M25511 Pain in right shoulder: Secondary | ICD-10-CM

## 2023-08-29 DIAGNOSIS — G8929 Other chronic pain: Secondary | ICD-10-CM

## 2023-08-29 NOTE — Progress Notes (Signed)
 Pain Scale   Average Pain 0 Patient has chronic neck pain radiating to right shoulder. Patient here for MRI review        +Driver, -BT, -Dye Allergies.

## 2023-08-29 NOTE — Progress Notes (Signed)
 Vanessa Fuentes - 78 y.o. female MRN [id number]  Date of birth: 1945-03-20  Office Visit Note: Visit Date: 08/29/2023 PCP: Joshua Debby CROME, MD Referred by: Joshua Debby CROME, MD  Subjective: Chief Complaint  Patient presents with   Neck - Pain, Follow-up   HPI: Vanessa Fuentes is a 78 y.o. female who comes in today for evaluation of chronic, worsening and severe pain to right neck and shoulder radiating down to elbow. Patient is somewhat of poor historian, history of vascular dementia. Husband at bedside. Her pain is constant, no specific aggravating factors. She describes as burning sensation, currently rates as 6 out of 10.  Some relief of pain with home exercise regimen, rest and use of medications.  Husband states she does take Tylenol as needed. Recent cervical MRI imaging shows solid interbody and facet fusion at C3-C4. There is moderate spinal canal stenosis and bilateral foraminal narrowing at C4-C5 due to osteophyte complex and arthritis. There is also central canal stenosis and flattening of the right anterior margin of the cord at C5-C6 due to disc osteophyte complex and arthritis. History of cervical fusion with Dr. Victory Gens about 10 years ago. Patient denies focal weakness, numbness and tingling. No recent trauma or falls.      Review of Systems  Musculoskeletal:  Positive for joint pain and neck pain.  Neurological:  Negative for tingling, sensory change, focal weakness and weakness.  All other systems reviewed and are negative.  Otherwise per HPI.  Assessment & Plan: Visit Diagnoses:    ICD-10-CM   1. Radiculopathy, cervical region  M54.12 Ambulatory referral to Physical Medicine Rehab    2. Chronic right shoulder pain  M25.511    G89.29     3. Spinal stenosis of cervical region  M48.02     4. Post laminectomy syndrome  M96.1        Plan: Findings:  Chronic, worsening and severe pain to right neck and shoulder radiating down to elbow.  Patient continues to  have severe pain despite good conservative therapy such as home exercise regimen, rest and use of medications.  Patient's clinical presentation and exam are consistent with cervical radiculopathy.  I discussed recent cervical MRI with patient and husband today using imaging and spine model.  There is a central canal stenosis noted at C4-C5 and C5-C6 due to disc osteophyte complex and facet arthropathy.  These findings could be a pain generator for her.  We discussed treatment plan in detail today.  Next step is to perform diagnostic and hopefully therapeutic right C7-T1 interlaminar epidural steroid injection under fluoroscopic guidance.  She is not currently taking anticoagulant medication.  I discussed injection procedure with patient and wife in detail, no questions at this time.  Dr. Eldonna also at the bedside to answer any further questions.  If good relief of pain with injection we can repeat this procedure infrequently as needed.  Should her pain persist would consider possible surgical consult with Dr. Ozell Ada, husband does voice today that he would like to avoid surgical intervention if all possible.  Her exam today was non focal, good strength noted to bilateral upper extremities, no myelopathic symptoms noted.  We will see her back for injection.    Meds & Orders: No orders of the defined types were placed in this encounter.   Orders Placed This Encounter  Procedures   Ambulatory referral to Physical Medicine Rehab    Follow-up: Return for Right C7-T1 interlaminar epidural steroid injection.  Procedures: No procedures performed      Clinical History: Narrative & Impression CLINICAL DATA:  Right shoulder common neck pain, duration 6 weeks. Prior remote cervical spine surgery.   EXAM: MRI CERVICAL SPINE WITHOUT CONTRAST   TECHNIQUE: Multiplanar, multisequence MR imaging of the cervical spine was performed. No intravenous contrast was administered.   COMPARISON:  Radiographs  06/19/2023 and prior MRI 05/18/2009   FINDINGS: Alignment: 2 mm degenerative anterolisthesis at C7-T1 and T2-3.   Vertebrae: Solid interbody and facet fusion at C3-4 with anterior plate and screw fixator.   Disc desiccation throughout cervical spine with multilevel loss of intervertebral disc height.   Type 2 degenerative endplate findings at C5-6 and C7-T1.   Degenerative facet edema on the right at C4-5, image 1 series 107.   Cord: No significant abnormal spinal cord signal is observed.   Posterior Fossa, vertebral arteries, paraspinal tissues: Unremarkable   Disc levels:   C2-3: No impingement. Left facet arthropathy. Central disc protrusion.   C3-4: No impingement.  Fused level.   C4-5: Prominent right and moderate to prominent left foraminal stenosis with moderate central stenosis due to disc osteophyte complex and facet arthropathy.   C5-6: Prominent bilateral foraminal stenosis and moderate to prominent right eccentric central stenosis due to disc osteophyte complex mild facet arthropathy. Flattening of the right anterior margin of the cord.   C6-7: Moderate right foraminal stenosis due to uncinate and facet spurring.   C7-T1: Moderate right and borderline left foraminal stenosis due to disc uncovering and facet arthropathy.   IMPRESSION: 1. Cervical spondylosis and degenerative disc disease causing prominent impingement at C4-5 and C5-6; and moderate impingement at C6-7 and C7-T1. 2. Solid interbody and facet fusion at C3-4. 3. Degenerative facet edema on the right at C4-5.     Electronically Signed   By: Ryan Salvage M.D.   On: 08/20/2023 12:05   She reports that she quit smoking about 42 years ago. Her smoking use included cigarettes. She has been exposed to tobacco smoke. She has never used smokeless tobacco. No results for input(s): HGBA1C, LABURIC in the last 8760 hours.  Objective:  VS:  HT:    WT:   BMI:     BP:   HR: bpm  TEMP: ( )   RESP:  Physical Exam Vitals and nursing note reviewed.  HENT:     Head: Normocephalic and atraumatic.     Right Ear: External ear normal.     Left Ear: External ear normal.     Nose: Nose normal.     Mouth/Throat:     Mouth: Mucous membranes are moist.  Eyes:     Extraocular Movements: Extraocular movements intact.  Cardiovascular:     Rate and Rhythm: Normal rate.     Pulses: Normal pulses.  Pulmonary:     Effort: Pulmonary effort is normal.  Abdominal:     General: Abdomen is flat. There is no distension.  Musculoskeletal:        General: Tenderness present.     Cervical back: Tenderness present.     Comments: No discomfort noted with flexion and extension. Mild pain with rotation to the right. Patient has good strength in the upper extremities including 5 out of 5 strength in wrist extension, long finger flexion and APB. Shoulder range of motion is full bilaterally without any sign of impingement. There is no atrophy of the hands intrinsically. Sensation intact bilaterally. Negative Hoffman's sign. Negative Spurling's sign.   Skin:    General:  Skin is warm and dry.     Capillary Refill: Capillary refill takes less than 2 seconds.  Neurological:     General: No focal deficit present.     Mental Status: She is alert and oriented to person, place, and time.  Psychiatric:        Mood and Affect: Mood normal.        Behavior: Behavior normal.     Ortho Exam  Imaging: No results found.  Past Medical/Family/Surgical/Social History: Medications & Allergies reviewed per EMR, new medications updated. Patient Active Problem List   Diagnosis Date Noted   Acute pain of right shoulder 06/19/2023   Degenerative, intervertebral disc, cervical 06/19/2023   Premature atrial complexes 12/07/2022   Encounter for general adult medical examination with abnormal findings 12/07/2022   Mild vascular dementia without behavioral disturbance, psychotic disturbance, mood disturbance, or  anxiety (HCC) 02/02/2021   Moderate vascular dementia without behavioral disturbance, psychotic disturbance, mood disturbance, or anxiety (HCC) 02/02/2021   Callous ulcer, limited to breakdown of skin (HCC) 02/01/2021   Stage 3a chronic kidney disease (HCC) 12/21/2020   Lumbar radiculopathy 06/13/2018   Gastroesophageal reflux disease with esophagitis 03/21/2016   IBS (irritable bowel syndrome) 07/20/2014   Osteoporosis 04/24/2013   Hyperlipidemia with target LDL less than 130 04/14/2011   Cervical radiculopathy 05/06/2009   Essential hypertension 10/13/2008   DEGENERATIVE DISC DISEASE, LUMBAR SPINE 10/13/2008   Past Medical History:  Diagnosis Date   Brachial neuritis or radiculitis NOS    Cervicalgia    Degeneration of cervical intervertebral disc    Degeneration of lumbar or lumbosacral intervertebral disc    Dysfunction of eustachian tube    Memory loss    Osteoporosis 01/24/2015   Spinal stenosis in cervical region    Unspecified essential hypertension    Family History  Problem Relation Age of Onset   Hypertension Mother    Heart disease Mother        CAD MI CABG   Cancer Mother    Heart disease Father        CAD MI   Hypertension Father    Dementia Brother    Heart disease Brother        CAD   Esophageal cancer Brother    COPD Brother    Cancer Maternal Aunt        Breast   Diabetes Neg Hx    Colon cancer Neg Hx    Stomach cancer Neg Hx    Past Surgical History:  Procedure Laterality Date   CERVICAL DISCECTOMY  07/2009   C3-4 with fusion- Dr Karlis   COLONOSCOPY     polypectomy   NASAL SEPTUM SURGERY  01/2009   [facility]   SPINE SURGERY     TUBAL LIGATION     Social History   Occupational History   Occupation: retired  Tobacco Use   Smoking status: Former    Current packs/day: 0.00    Types: Cigarettes    Quit date: 02/16/1981    Years since quitting: 42.5    Passive exposure: Past   Smokeless tobacco: Never   Tobacco comments:    During  Adolescence  Substance and Sexual Activity   Alcohol use: Not Currently    Alcohol/week: 1.0 standard drink of alcohol    Types: 1 Glasses of wine per week    Comment: 1 glass per week sometimes   Drug use: No   Sexual activity: Yes    Partners: Male

## 2023-08-30 ENCOUNTER — Other Ambulatory Visit: Payer: Self-pay | Admitting: Internal Medicine

## 2023-08-30 DIAGNOSIS — F01B0 Vascular dementia, moderate, without behavioral disturbance, psychotic disturbance, mood disturbance, and anxiety: Secondary | ICD-10-CM

## 2023-08-30 NOTE — Telephone Encounter (Signed)
 Copied from CRM [id number]. Topic: Clinical - Medication Refill >> Aug 30, 2023  8:03 AM Suzen RAMAN wrote: Medication: memantine  (NAMENDA ) 10 MG tablet  Has the patient contacted their pharmacy? Yes   This is the patient's preferred pharmacy:  [facility] [id number] [place], [place] - [address] [place] [address] Phone: [phone] Fax: [phone]   Is this the correct pharmacy for this prescription? Yes If no, delete pharmacy and type the correct one.   Has the prescription been filled recently? No  Is the patient out of the medication? Yes  Has the patient been seen for an appointment in the last year OR does the patient have an upcoming appointment? Yes  Can we respond through MyChart? Yes  Agent: Please be advised that Rx refills may take up to 3 business days. We ask that you follow-up with your pharmacy.

## 2023-09-03 MED ORDER — MEMANTINE HCL 10 MG PO TABS: 10.0000 mg | ORAL_TABLET | Freq: Two times a day (BID) | ORAL | 1 refills | Status: AC

## 2023-09-17 DIAGNOSIS — Z1231 Encounter for screening mammogram for malignant neoplasm of breast: Secondary | ICD-10-CM | POA: Diagnosis not present

## 2023-09-17 LAB — HM MAMMOGRAPHY

## 2023-09-19 ENCOUNTER — Encounter: Payer: Self-pay | Admitting: Internal Medicine

## 2023-09-27 ENCOUNTER — Ambulatory Visit: Admitting: Physical Medicine and Rehabilitation

## 2023-09-27 ENCOUNTER — Other Ambulatory Visit: Payer: Self-pay

## 2023-09-27 ENCOUNTER — Encounter: Payer: Self-pay | Admitting: Physical Medicine and Rehabilitation

## 2023-09-27 VITALS — BP 134/78 | HR 73

## 2023-09-27 DIAGNOSIS — M4802 Spinal stenosis, cervical region: Secondary | ICD-10-CM | POA: Diagnosis not present

## 2023-09-27 DIAGNOSIS — M5412 Radiculopathy, cervical region: Secondary | ICD-10-CM | POA: Diagnosis not present

## 2023-09-27 MED ORDER — METHYLPREDNISOLONE ACETATE 40 MG/ML IJ SUSP
40.0000 mg | Freq: Once | INTRAMUSCULAR | Status: AC
  Administered 2023-09-27: 40 mg

## 2023-09-27 NOTE — Progress Notes (Signed)
 Pain Scale   Average Pain 4 Patient advising she has chronic neck pain non-radiating, patient advising her pain comes and goes, some days worse than others.        +Driver, -BT, -Dye Allergies.

## 2023-09-27 NOTE — Progress Notes (Signed)
 [name] - 78 y.o. female MRN [id number]  Date of birth: Feb 15, 1945  Office Visit Note: Visit Date: 09/27/2023 PCP: Joshua Debby CROME, MD Referred by: Joshua Debby CROME, MD  Subjective: Chief Complaint  Patient presents with   Neck - Pain   HPI: Vanessa Fuentes is a 78 y.o. female who comes in todayfor evaluation and management of chronic worsening neck and shoulder pain.  She was in fact scheduled to come in today for cervical epidural injection.  She is present with her husband who provides a lot of the history.  Patient does suffer from dementia.  They report that over the last week or so she has really had a lot less pain and doing quite well overall.  It is not really affecting her daily activities at this point she is sleeping well.  She reports to me herself significantly reduced amount of neck and shoulder pain.  There is still some present but not bad at all.  No focal weakness etc.     Review of Systems  Musculoskeletal:  Positive for neck pain.  All other systems reviewed and are negative.  Otherwise per HPI.  Assessment & Plan: Visit Diagnoses:    ICD-10-CM   1. Cervical radiculopathy  M54.12 methylPREDNISolone  acetate (DEPO-MEDROL ) injection 40 mg    CANCELED: XR C-ARM NO REPORT    CANCELED: Epidural Steroid injection    2. Spinal stenosis of cervical region  M48.02        Plan: Findings:  At this point organ to hold off on the cervical epidural injection.  If her symptoms recur even pretty quickly we would have her come back in for injection.  I did tell him that we would try to work then then as fast as we could although it would not be the same day if her symptoms do return.  We had a long discussion about epidural injections and how they work and we both decided to have at this point hold off on that.  She will continue with current medication regimen and home exercise program.    Meds & Orders:  Meds ordered this encounter  Medications    methylPREDNISolone  acetate (DEPO-MEDROL ) injection 40 mg   No orders of the defined types were placed in this encounter.   Follow-up: No follow-ups on file.   Procedures: No procedures performed  Cervical Epidural Steroid Injection - Interlaminar Approach with Fluoroscopic Guidance  Patient: Vanessa Fuentes      Date of Birth: [birth year] MRN: [id number] PCP: Joshua Debby CROME, MD      Visit Date: 09/27/2023   Universal Protocol:    Date/Time: 09/04/251:30 PM  Consent Given By: the patient  Position: PRONE  Additional Comments: Vital signs were monitored before and after the procedure. Patient was prepped and draped in the usual sterile fashion. The correct patient, procedure, and site was verified.   Injection Procedure Details:   Procedure diagnoses: Cervical radiculopathy [M54.12]    Meds Administered:  Meds ordered this encounter  Medications   methylPREDNISolone  acetate (DEPO-MEDROL ) injection 40 mg     Laterality: Right  Location/Site: C7-T1  Needle: 3.5 in., 20 ga. Tuohy  Needle Placement: Paramedian epidural space  Findings:  -Comments: Excellent flow of contrast into the epidural space.  Procedure Details: Using a paramedian approach from the side mentioned above, the region overlying the inferior lamina was localized under fluoroscopic visualization and the soft tissues overlying this structure were infiltrated with 4 ml. of 1% Lidocaine without Epinephrine.  A # 20 gauge, Tuohy needle was inserted into the epidural space using a paramedian approach.  The epidural space was localized using loss of resistance along with contralateral oblique bi-planar fluoroscopic views.  After negative aspirate for air, blood, and CSF, a 2 ml. volume of Isovue-250 was injected into the epidural space and the flow of contrast was observed. Radiographs were obtained for documentation purposes.   The injectate was administered into the level noted above.  Additional  Comments:  The patient tolerated the procedure well Dressing: 2 x 2 sterile gauze and Band-Aid    Post-procedure details: Patient was observed during the procedure. Post-procedure instructions were reviewed.  Patient left the clinic in stable condition.   Clinical History: Narrative & Impression CLINICAL DATA:  Right shoulder common neck pain, duration 6 weeks. Prior remote cervical spine surgery.   EXAM: MRI CERVICAL SPINE WITHOUT CONTRAST   TECHNIQUE: Multiplanar, multisequence MR imaging of the cervical spine was performed. No intravenous contrast was administered.   COMPARISON:  Radiographs 06/19/2023 and prior MRI 05/18/2009   FINDINGS: Alignment: 2 mm degenerative anterolisthesis at C7-T1 and T2-3.   Vertebrae: Solid interbody and facet fusion at C3-4 with anterior plate and screw fixator.   Disc desiccation throughout cervical spine with multilevel loss of intervertebral disc height.   Type 2 degenerative endplate findings at C5-6 and C7-T1.   Degenerative facet edema on the right at C4-5, image 1 series 107.   Cord: No significant abnormal spinal cord signal is observed.   Posterior Fossa, vertebral arteries, paraspinal tissues: Unremarkable   Disc levels:   C2-3: No impingement. Left facet arthropathy. Central disc protrusion.   C3-4: No impingement.  Fused level.   C4-5: Prominent right and moderate to prominent left foraminal stenosis with moderate central stenosis due to disc osteophyte complex and facet arthropathy.   C5-6: Prominent bilateral foraminal stenosis and moderate to prominent right eccentric central stenosis due to disc osteophyte complex mild facet arthropathy. Flattening of the right anterior margin of the cord.   C6-7: Moderate right foraminal stenosis due to uncinate and facet spurring.   C7-T1: Moderate right and borderline left foraminal stenosis due to disc uncovering and facet arthropathy.   IMPRESSION: 1. Cervical  spondylosis and degenerative disc disease causing prominent impingement at C4-5 and C5-6; and moderate impingement at C6-7 and C7-T1. 2. Solid interbody and facet fusion at C3-4. 3. Degenerative facet edema on the right at C4-5.     Electronically Signed   By: Ryan Salvage M.D.   On: 08/20/2023 12:05   She reports that she quit smoking about 42 years ago. Her smoking use included cigarettes. She has been exposed to tobacco smoke. She has never used smokeless tobacco. No results for input(s): HGBA1C, LABURIC in the last 8760 hours.  Objective:  VS:  HT:    WT:   BMI:     BP:134/78  HR:73bpm  TEMP: ( )  RESP:  Physical Exam Vitals and nursing note reviewed.  Constitutional:      General: She is not in acute distress.    Appearance: Normal appearance. She is not ill-appearing.  HENT:     Head: Normocephalic and atraumatic.     Right Ear: External ear normal.     Left Ear: External ear normal.  Eyes:     Extraocular Movements: Extraocular movements intact.  Cardiovascular:     Rate and Rhythm: Normal rate.     Pulses: Normal pulses.  Musculoskeletal:     Cervical back: Tenderness  present. No rigidity.     Right lower leg: No edema.     Left lower leg: No edema.     Comments: Patient has good strength in the upper extremities including 5 out of 5 strength in wrist extension long finger flexion and APB.  There is no atrophy of the hands intrinsically.  There is a negative Hoffmann's test.   Lymphadenopathy:     Cervical: No cervical adenopathy.  Skin:    Findings: No erythema, lesion or rash.  Neurological:     General: No focal deficit present.     Mental Status: She is alert and oriented to person, place, and time.     Sensory: No sensory deficit.     Motor: No weakness or abnormal muscle tone.     Coordination: Coordination normal.  Psychiatric:        Mood and Affect: Mood normal.        Behavior: Behavior normal.     Ortho Exam  Imaging: No results  found.  Past Medical/Family/Surgical/Social History: Medications & Allergies reviewed per EMR, new medications updated. Patient Active Problem List   Diagnosis Date Noted   Acute pain of right shoulder 06/19/2023   Degenerative, intervertebral disc, cervical 06/19/2023   Premature atrial complexes 12/07/2022   Encounter for general adult medical examination with abnormal findings 12/07/2022   Mild vascular dementia without behavioral disturbance, psychotic disturbance, mood disturbance, or anxiety (HCC) 02/02/2021   Moderate vascular dementia without behavioral disturbance, psychotic disturbance, mood disturbance, or anxiety (HCC) 02/02/2021   Callous ulcer, limited to breakdown of skin (HCC) 02/01/2021   Stage 3a chronic kidney disease (HCC) 12/21/2020   Lumbar radiculopathy 06/13/2018   Gastroesophageal reflux disease with esophagitis 03/21/2016   IBS (irritable bowel syndrome) 07/20/2014   Osteoporosis 04/24/2013   Hyperlipidemia with target LDL less than 130 04/14/2011   Cervical radiculopathy 05/06/2009   Essential hypertension 10/13/2008   DEGENERATIVE DISC DISEASE, LUMBAR SPINE 10/13/2008   Past Medical History:  Diagnosis Date   Brachial neuritis or radiculitis NOS    Cervicalgia    Degeneration of cervical intervertebral disc    Degeneration of lumbar or lumbosacral intervertebral disc    Dysfunction of eustachian tube    Memory loss    Osteoporosis 01/24/2015   Spinal stenosis in cervical region    Unspecified essential hypertension    Family History  Problem Relation Age of Onset   Hypertension Mother    Heart disease Mother        CAD MI CABG   Cancer Mother    Heart disease Father        CAD MI   Hypertension Father    Dementia Brother    Heart disease Brother        CAD   Esophageal cancer Brother    COPD Brother    Cancer Maternal Aunt        Breast   Diabetes Neg Hx    Colon cancer Neg Hx    Stomach cancer Neg Hx    Past Surgical History:   Procedure Laterality Date   CERVICAL DISCECTOMY  07/2009   C3-4 with fusion- Dr Karlis   COLONOSCOPY     polypectomy   NASAL SEPTUM SURGERY  01/2009   [facility]   SPINE SURGERY     TUBAL LIGATION     Social History   Occupational History   Occupation: retired  Tobacco Use   Smoking status: Former    Current packs/day: 0.00  Types: Cigarettes    Quit date: 02/16/1981    Years since quitting: 42.7    Passive exposure: Past   Smokeless tobacco: Never   Tobacco comments:    During Adolescence  Substance and Sexual Activity   Alcohol use: Not Currently    Alcohol/week: 1.0 standard drink of alcohol    Types: 1 Glasses of wine per week    Comment: 1 glass per week sometimes   Drug use: No   Sexual activity: Yes    Partners: Male

## 2023-09-27 NOTE — Procedures (Signed)
 Cervical Epidural Steroid Injection - Interlaminar Approach with Fluoroscopic Guidance  Patient: Vanessa Fuentes      Date of Birth: 1945-12-02 MRN: [id number] PCP: Joshua Debby CROME, MD      Visit Date: 09/27/2023   Universal Protocol:    Date/Time: 09/04/251:30 PM  Consent Given By: the patient  Position: PRONE  Additional Comments: Vital signs were monitored before and after the procedure. Patient was prepped and draped in the usual sterile fashion. The correct patient, procedure, and site was verified.   Injection Procedure Details:   Procedure diagnoses: Cervical radiculopathy [M54.12]    Meds Administered:  Meds ordered this encounter  Medications   methylPREDNISolone  acetate (DEPO-MEDROL ) injection 40 mg     Laterality: Right  Location/Site: C7-T1  Needle: 3.5 in., 20 ga. Tuohy  Needle Placement: Paramedian epidural space  Findings:  -Comments: Excellent flow of contrast into the epidural space.  Procedure Details: Using a paramedian approach from the side mentioned above, the region overlying the inferior lamina was localized under fluoroscopic visualization and the soft tissues overlying this structure were infiltrated with 4 ml. of 1% Lidocaine without Epinephrine. A # 20 gauge, Tuohy needle was inserted into the epidural space using a paramedian approach.  The epidural space was localized using loss of resistance along with contralateral oblique bi-planar fluoroscopic views.  After negative aspirate for air, blood, and CSF, a 2 ml. volume of Isovue-250 was injected into the epidural space and the flow of contrast was observed. Radiographs were obtained for documentation purposes.   The injectate was administered into the level noted above.  Additional Comments:  The patient tolerated the procedure well Dressing: 2 x 2 sterile gauze and Band-Aid    Post-procedure details: Patient was observed during the procedure. Post-procedure instructions were  reviewed.  Patient left the clinic in stable condition.

## 2023-09-28 DIAGNOSIS — R928 Other abnormal and inconclusive findings on diagnostic imaging of breast: Secondary | ICD-10-CM | POA: Diagnosis not present

## 2023-09-28 DIAGNOSIS — N6489 Other specified disorders of breast: Secondary | ICD-10-CM | POA: Diagnosis not present

## 2023-10-17 ENCOUNTER — Other Ambulatory Visit: Payer: Self-pay | Admitting: Internal Medicine

## 2023-10-17 DIAGNOSIS — K2100 Gastro-esophageal reflux disease with esophagitis, without bleeding: Secondary | ICD-10-CM

## 2023-10-27 ENCOUNTER — Other Ambulatory Visit: Payer: Self-pay | Admitting: Internal Medicine

## 2023-10-27 DIAGNOSIS — E785 Hyperlipidemia, unspecified: Secondary | ICD-10-CM

## 2023-11-06 DIAGNOSIS — D692 Other nonthrombocytopenic purpura: Secondary | ICD-10-CM | POA: Diagnosis not present

## 2023-11-06 DIAGNOSIS — D225 Melanocytic nevi of trunk: Secondary | ICD-10-CM | POA: Diagnosis not present

## 2023-11-06 DIAGNOSIS — L570 Actinic keratosis: Secondary | ICD-10-CM | POA: Diagnosis not present

## 2023-11-06 DIAGNOSIS — L821 Other seborrheic keratosis: Secondary | ICD-10-CM | POA: Diagnosis not present

## 2023-11-06 DIAGNOSIS — L814 Other melanin hyperpigmentation: Secondary | ICD-10-CM | POA: Diagnosis not present

## 2023-11-26 ENCOUNTER — Encounter: Payer: Self-pay | Admitting: Radiology

## 2023-12-25 ENCOUNTER — Telehealth: Payer: Self-pay

## 2023-12-25 NOTE — Telephone Encounter (Signed)
 Copied from CRM #[id number]. Topic: General - Other >> Dec 24, 2023  4:40 PM Vanessa Fuentes wrote: Reason for CRM: Patient was seen at [facility] today for her short term memory loss,,and was to fax a form in to pcp to fill out and return , and Patients husband needs a return phone call to confirm receipt of form .

## 2023-12-26 NOTE — Telephone Encounter (Signed)
 Left a detailed message about us  receiving the form.

## 2024-01-12 ENCOUNTER — Other Ambulatory Visit: Payer: Self-pay | Admitting: Internal Medicine

## 2024-01-12 DIAGNOSIS — K2100 Gastro-esophageal reflux disease with esophagitis, without bleeding: Secondary | ICD-10-CM

## 2024-01-25 ENCOUNTER — Other Ambulatory Visit: Payer: Self-pay | Admitting: Internal Medicine

## 2024-01-25 DIAGNOSIS — E785 Hyperlipidemia, unspecified: Secondary | ICD-10-CM

## 2024-02-08 ENCOUNTER — Other Ambulatory Visit: Payer: Self-pay | Admitting: Internal Medicine

## 2024-02-08 DIAGNOSIS — I10 Essential (primary) hypertension: Secondary | ICD-10-CM

## 2024-02-11 ENCOUNTER — Telehealth: Payer: Self-pay

## 2024-02-11 NOTE — Telephone Encounter (Signed)
 Copied from CRM #[id number]. Topic: Clinical - Medication Refill >> Feb 11, 2024  8:14 AM Deleta RAMAN wrote: Medication: valsartan  (DIOVAN ) 160 MG tablet  Has the patient contacted their pharmacy? Yes (Agent: If no, request that the patient contact the pharmacy for the refill. If patient does not wish to contact the pharmacy document the reason why and proceed with request.) (Agent: If yes, when and what did the pharmacy advise?)  This is the patient's preferred pharmacy:  [id number] - [place], [place] - [address] [place] [address] Phone: [phone] Fax: [phone]    Is this the correct pharmacy for this prescription? Yes If no, delete pharmacy and type the correct one.   Has the prescription been filled recently? No  Is the patient out of the medication? No  Has the patient been seen for an appointment in the last year OR does the patient have an upcoming appointment? Yes  Can we respond through MyChart? Yes  Agent: Please be advised that Rx refills may take up to 3 business days. We ask that you follow-up with your pharmacy.

## 2024-02-12 ENCOUNTER — Other Ambulatory Visit: Payer: Self-pay

## 2024-02-12 DIAGNOSIS — I10 Essential (primary) hypertension: Secondary | ICD-10-CM

## 2024-02-12 MED ORDER — VALSARTAN 160 MG PO TABS: 160.0000 mg | ORAL_TABLET | Freq: Every day | ORAL | 0 refills | Status: DC

## 2024-02-12 NOTE — Telephone Encounter (Signed)
 Patient has been scheduled for Dr.Jones' next opening on 03/17/24, but would like to know if she can get enough medication filled to last until her appointment. Best callback is [phone].

## 2024-02-13 ENCOUNTER — Other Ambulatory Visit: Payer: Self-pay

## 2024-02-13 DIAGNOSIS — I10 Essential (primary) hypertension: Secondary | ICD-10-CM

## 2024-02-13 MED ORDER — VALSARTAN 160 MG PO TABS: 160.0000 mg | ORAL_TABLET | Freq: Every day | ORAL | 0 refills | Status: AC

## 2024-02-13 NOTE — Telephone Encounter (Signed)
 Temp supply has been sent patients husband has been made aware.

## 2024-03-17 ENCOUNTER — Ambulatory Visit: Admitting: Internal Medicine

## 2024-05-05 ENCOUNTER — Ambulatory Visit: Payer: PRIVATE HEALTH INSURANCE

## 2024-06-03 ENCOUNTER — Ambulatory Visit: Payer: PRIVATE HEALTH INSURANCE
